# Patient Record
Sex: Female | Born: 1955
Health system: Southern US, Community
[De-identification: ages and names within clinical notes are randomized; demographics above are authoritative.]

## PROBLEM LIST (undated history)

## (undated) DIAGNOSIS — F17200 Nicotine dependence, unspecified, uncomplicated: Secondary | ICD-10-CM

## (undated) DIAGNOSIS — J45901 Unspecified asthma with (acute) exacerbation: Secondary | ICD-10-CM

## (undated) DIAGNOSIS — R160 Hepatomegaly, not elsewhere classified: Secondary | ICD-10-CM

## (undated) DIAGNOSIS — Z6841 Body Mass Index (BMI) 40.0 and over, adult: Secondary | ICD-10-CM

## (undated) DIAGNOSIS — J984 Other disorders of lung: Secondary | ICD-10-CM

## (undated) DIAGNOSIS — J441 Chronic obstructive pulmonary disease with (acute) exacerbation: Secondary | ICD-10-CM

## (undated) DIAGNOSIS — G4733 Obstructive sleep apnea (adult) (pediatric): Secondary | ICD-10-CM

## (undated) DIAGNOSIS — R0602 Shortness of breath: Secondary | ICD-10-CM

## (undated) DIAGNOSIS — I517 Cardiomegaly: Secondary | ICD-10-CM

## (undated) DIAGNOSIS — I2721 Secondary pulmonary arterial hypertension: Secondary | ICD-10-CM

## (undated) DIAGNOSIS — R0683 Snoring: Secondary | ICD-10-CM

## (undated) DIAGNOSIS — E669 Obesity, unspecified: Secondary | ICD-10-CM

## (undated) DIAGNOSIS — I2789 Other specified pulmonary heart diseases: Secondary | ICD-10-CM

## (undated) DIAGNOSIS — R609 Edema, unspecified: Secondary | ICD-10-CM

## (undated) DIAGNOSIS — E785 Hyperlipidemia, unspecified: Secondary | ICD-10-CM

## (undated) HISTORY — DX: Secondary pulmonary arterial hypertension: I27.21

## (undated) HISTORY — DX: Unspecified asthma with (acute) exacerbation: J45.901

## (undated) HISTORY — PX: DILATION AND CURETTAGE OF UTERUS: SHX78

## (undated) HISTORY — DX: Cardiomegaly: I51.7

## (undated) HISTORY — DX: Edema, unspecified: R60.9

## (undated) HISTORY — DX: Other disorders of lung: J98.4

## (undated) HISTORY — DX: Hyperlipidemia, unspecified: E78.5

## (undated) HISTORY — PX: TUBAL LIGATION: SHX77

## (undated) HISTORY — DX: Obesity, unspecified: E66.9

## (undated) HISTORY — DX: Nicotine dependence, unspecified, uncomplicated: F17.200

## (undated) HISTORY — DX: Other specified pulmonary heart diseases: I27.89

## (undated) HISTORY — DX: Body Mass Index (BMI) 40.0 and over, adult: Z684

## (undated) HISTORY — DX: Chronic obstructive pulmonary disease with (acute) exacerbation: J44.1

## (undated) HISTORY — DX: Hepatomegaly, not elsewhere classified: R16.0

## (undated) HISTORY — DX: Obstructive sleep apnea (adult) (pediatric): G47.33

---

## 2011-04-01 DIAGNOSIS — I509 Heart failure, unspecified: Secondary | ICD-10-CM

## 2011-04-01 DIAGNOSIS — I279 Pulmonary heart disease, unspecified: Secondary | ICD-10-CM

## 2011-04-02 ENCOUNTER — Inpatient Hospital Stay (HOSPITAL_COMMUNITY)
Admission: AD | Admit: 2011-04-02 | Discharge: 2011-04-09 | DRG: 286 | Disposition: A | Discharge status: Home / Self Care | Payer: Medicaid - Out of State | Source: Other Acute Inpatient Hospital | Attending: Internal Medicine | Admitting: Internal Medicine

## 2011-04-02 DIAGNOSIS — F17200 Nicotine dependence, unspecified, uncomplicated: Secondary | ICD-10-CM | POA: Diagnosis present

## 2011-04-02 DIAGNOSIS — Z7982 Long term (current) use of aspirin: Secondary | ICD-10-CM

## 2011-04-02 DIAGNOSIS — J9610 Chronic respiratory failure, unspecified whether with hypoxia or hypercapnia: Secondary | ICD-10-CM

## 2011-04-02 DIAGNOSIS — G4733 Obstructive sleep apnea (adult) (pediatric): Secondary | ICD-10-CM | POA: Diagnosis present

## 2011-04-02 DIAGNOSIS — I2789 Other specified pulmonary heart diseases: Secondary | ICD-10-CM | POA: Diagnosis present

## 2011-04-02 DIAGNOSIS — Z79899 Other long term (current) drug therapy: Secondary | ICD-10-CM

## 2011-04-02 DIAGNOSIS — J9620 Acute and chronic respiratory failure, unspecified whether with hypoxia or hypercapnia: Secondary | ICD-10-CM | POA: Diagnosis present

## 2011-04-02 DIAGNOSIS — I5031 Acute diastolic (congestive) heart failure: Secondary | ICD-10-CM | POA: Diagnosis present

## 2011-04-02 DIAGNOSIS — I2609 Other pulmonary embolism with acute cor pulmonale: Principal | ICD-10-CM | POA: Diagnosis present

## 2011-04-02 DIAGNOSIS — J449 Chronic obstructive pulmonary disease, unspecified: Secondary | ICD-10-CM

## 2011-04-02 DIAGNOSIS — G4710 Hypersomnia, unspecified: Secondary | ICD-10-CM

## 2011-04-02 DIAGNOSIS — E662 Morbid (severe) obesity with alveolar hypoventilation: Secondary | ICD-10-CM | POA: Diagnosis present

## 2011-04-02 DIAGNOSIS — R0602 Shortness of breath: Secondary | ICD-10-CM

## 2011-04-02 DIAGNOSIS — G4730 Sleep apnea, unspecified: Secondary | ICD-10-CM

## 2011-04-02 DIAGNOSIS — R0902 Hypoxemia: Secondary | ICD-10-CM | POA: Diagnosis present

## 2011-04-02 DIAGNOSIS — J441 Chronic obstructive pulmonary disease with (acute) exacerbation: Secondary | ICD-10-CM | POA: Diagnosis present

## 2011-04-02 DIAGNOSIS — IMO0002 Reserved for concepts with insufficient information to code with codable children: Secondary | ICD-10-CM

## 2011-04-02 DIAGNOSIS — Z7901 Long term (current) use of anticoagulants: Secondary | ICD-10-CM

## 2011-04-02 HISTORY — PX: CARDIAC CATHETERIZATION: SHX172

## 2011-04-02 HISTORY — DX: Shortness of breath: R06.02

## 2011-04-02 LAB — POCT I-STAT 3, VENOUS BLOOD GAS (G3P V)
Bicarbonate: 30.4 mEq/L — ABNORMAL HIGH (ref 20.0–24.0)
Bicarbonate: 25.8 mEq/L — ABNORMAL HIGH (ref 20.0–24.0)
Bicarbonate: 31.4 mEq/L — ABNORMAL HIGH (ref 20.0–24.0)
TCO2: 33 mmol/L (ref 0–100)
pCO2, Ven: 50.7 mmHg — ABNORMAL HIGH (ref 45.0–50.0)
pCO2, Ven: 51 mmHg — ABNORMAL HIGH (ref 45.0–50.0)
pCO2, Ven: 51.1 mmHg — ABNORMAL HIGH (ref 45.0–50.0)
pCO2, Ven: 53.5 mmHg — ABNORMAL HIGH (ref 45.0–50.0)
pCO2, Ven: 53.6 mmHg — ABNORMAL HIGH (ref 45.0–50.0)
pCO2, Ven: 53.9 mmHg — ABNORMAL HIGH (ref 45.0–50.0)
pH, Ven: 7.383 — ABNORMAL HIGH (ref 7.250–7.300)
pH, Ven: 7.392 — ABNORMAL HIGH (ref 7.250–7.300)
pH, Ven: 7.311 — ABNORMAL HIGH (ref 7.250–7.300)
pH, Ven: 7.373 — ABNORMAL HIGH (ref 7.250–7.300)
pH, Ven: 7.38 — ABNORMAL HIGH (ref 7.250–7.300)
pH, Ven: 7.382 — ABNORMAL HIGH (ref 7.250–7.300)
pO2, Ven: 39 mmHg (ref 30.0–45.0)
pO2, Ven: 41 mmHg (ref 30.0–45.0)
pO2, Ven: 42 mmHg (ref 30.0–45.0)

## 2011-04-02 LAB — PROTIME-INR
INR: 1.3 (ref 0.00–1.49)
Prothrombin Time: 16.4 seconds — ABNORMAL HIGH (ref 11.6–15.2)

## 2011-04-02 LAB — HEMOGLOBIN A1C
Hgb A1c MFr Bld: 6.4 % — ABNORMAL HIGH (ref <5.7)
Mean Plasma Glucose: 137 mg/dL — ABNORMAL HIGH (ref <117)

## 2011-04-02 LAB — POCT I-STAT 3, ART BLOOD GAS (G3+)
Acid-Base Excess: 3 mmol/L — ABNORMAL HIGH (ref 0.0–2.0)
pH, Arterial: 7.407 — ABNORMAL HIGH (ref 7.350–7.400)
pO2, Arterial: 80 mmHg (ref 80.0–100.0)

## 2011-04-03 ENCOUNTER — Encounter (HOSPITAL_COMMUNITY): Payer: Self-pay | Admitting: Radiology

## 2011-04-03 ENCOUNTER — Inpatient Hospital Stay (HOSPITAL_COMMUNITY): Payer: Medicaid - Out of State

## 2011-04-03 DIAGNOSIS — R0602 Shortness of breath: Secondary | ICD-10-CM | POA: Insufficient documentation

## 2011-04-03 DIAGNOSIS — M79609 Pain in unspecified limb: Secondary | ICD-10-CM

## 2011-04-03 LAB — BASIC METABOLIC PANEL
CO2: 34 mEq/L — ABNORMAL HIGH (ref 19–32)
Calcium: 9.6 mg/dL (ref 8.4–10.5)
GFR calc Af Amer: >60 mL/min (ref >60)
GFR calc non Af Amer: >60 mL/min (ref >60)
Sodium: 138 mEq/L (ref 135–145)

## 2011-04-03 LAB — CK TOTAL AND CKMB (NOT AT ARMC): Total CK: 34 U/L (ref 7–177)

## 2011-04-03 LAB — CBC
MCH: 31.3 pg (ref 26.0–34.0)
MCHC: 32.8 g/dL (ref 30.0–36.0)
Platelets: 216 10*3/uL (ref 150–400)
RBC: 5.37 MIL/uL — ABNORMAL HIGH (ref 3.87–5.11)

## 2011-04-03 LAB — HIV ANTIBODY (ROUTINE TESTING W REFLEX): HIV: NONREACTIVE

## 2011-04-03 LAB — LACTIC ACID, PLASMA: Lactic Acid, Venous: 2.1 mmol/L (ref 0.5–2.2)

## 2011-04-03 MED ORDER — TECHNETIUM TO 99M ALBUMIN AGGREGATED
6.0000 | Freq: Once | INTRAVENOUS | Status: AC | PRN
  Administered 2011-04-03: 6 via INTRAVENOUS

## 2011-04-03 MED ORDER — XENON XE 133 GAS
10.0000 | GAS_FOR_INHALATION | Freq: Once | RESPIRATORY_TRACT | Status: AC | PRN
  Administered 2011-04-03: 10 via RESPIRATORY_TRACT

## 2011-04-04 DIAGNOSIS — I517 Cardiomegaly: Secondary | ICD-10-CM

## 2011-04-04 LAB — PROTIME-INR
INR: 1.19 (ref 0.00–1.49)
Prothrombin Time: 15.4 seconds — ABNORMAL HIGH (ref 11.6–15.2)

## 2011-04-04 LAB — CBC
HCT: 53.1 % — ABNORMAL HIGH (ref 36.0–46.0)
Hemoglobin: 16.6 g/dL — ABNORMAL HIGH (ref 12.0–15.0)
RBC: 5.55 MIL/uL — ABNORMAL HIGH (ref 3.87–5.11)
WBC: 9.9 10*3/uL (ref 4.0–10.5)

## 2011-04-04 LAB — BASIC METABOLIC PANEL
BUN: 26 mg/dL — ABNORMAL HIGH (ref 6–23)
Calcium: 9.6 mg/dL (ref 8.4–10.5)
Creatinine, Ser: 0.65 mg/dL (ref 0.50–1.10)
GFR calc Af Amer: >60 mL/min (ref >60)
GFR calc non Af Amer: >60 mL/min (ref >60)
Potassium: 3.4 mEq/L — ABNORMAL LOW (ref 3.5–5.1)

## 2011-04-05 ENCOUNTER — Inpatient Hospital Stay (HOSPITAL_COMMUNITY): Payer: Medicaid - Out of State

## 2011-04-05 DIAGNOSIS — G4710 Hypersomnia, unspecified: Secondary | ICD-10-CM

## 2011-04-05 DIAGNOSIS — I2789 Other specified pulmonary heart diseases: Secondary | ICD-10-CM

## 2011-04-05 DIAGNOSIS — G4730 Sleep apnea, unspecified: Secondary | ICD-10-CM

## 2011-04-05 DIAGNOSIS — J449 Chronic obstructive pulmonary disease, unspecified: Secondary | ICD-10-CM

## 2011-04-05 DIAGNOSIS — J9610 Chronic respiratory failure, unspecified whether with hypoxia or hypercapnia: Secondary | ICD-10-CM

## 2011-04-05 LAB — CBC
MCH: 30.6 pg (ref 26.0–34.0)
MCHC: 32.1 g/dL (ref 30.0–36.0)
MCV: 95.4 fL (ref 78.0–100.0)
Platelets: 148 10*3/uL — ABNORMAL LOW (ref 150–400)
RDW: 15.7 % — ABNORMAL HIGH (ref 11.5–15.5)

## 2011-04-05 LAB — BASIC METABOLIC PANEL
BUN: 23 mg/dL (ref 6–23)
CO2: 36 mEq/L — ABNORMAL HIGH (ref 19–32)
Calcium: 9.2 mg/dL (ref 8.4–10.5)
GFR calc non Af Amer: >60 mL/min (ref >60)
Glucose, Bld: 91 mg/dL (ref 70–99)

## 2011-04-05 LAB — CYCLIC CITRUL PEPTIDE ANTIBODY, IGG: Cyclic Citrullin Peptide Ab: <2 U/mL (ref 0.0–5.0)

## 2011-04-06 LAB — BASIC METABOLIC PANEL
CO2: 32 mEq/L (ref 19–32)
GFR calc non Af Amer: >60 mL/min (ref >60)
Glucose, Bld: 97 mg/dL (ref 70–99)
Potassium: 4.4 mEq/L (ref 3.5–5.1)
Sodium: 137 mEq/L (ref 135–145)

## 2011-04-06 LAB — CBC
HCT: 53.2 % — ABNORMAL HIGH (ref 36.0–46.0)
MCV: 95.9 fL (ref 78.0–100.0)
Platelets: 158 10*3/uL (ref 150–400)
RBC: 5.55 MIL/uL — ABNORMAL HIGH (ref 3.87–5.11)
WBC: 10 10*3/uL (ref 4.0–10.5)

## 2011-04-06 LAB — PROTIME-INR: INR: 1.73 — ABNORMAL HIGH (ref 0.00–1.49)

## 2011-04-07 DIAGNOSIS — J9610 Chronic respiratory failure, unspecified whether with hypoxia or hypercapnia: Secondary | ICD-10-CM

## 2011-04-07 DIAGNOSIS — I2789 Other specified pulmonary heart diseases: Secondary | ICD-10-CM

## 2011-04-07 DIAGNOSIS — J449 Chronic obstructive pulmonary disease, unspecified: Secondary | ICD-10-CM

## 2011-04-07 DIAGNOSIS — G4710 Hypersomnia, unspecified: Secondary | ICD-10-CM

## 2011-04-07 DIAGNOSIS — G4730 Sleep apnea, unspecified: Secondary | ICD-10-CM

## 2011-04-07 LAB — PROTIME-INR
INR: 2 — ABNORMAL HIGH (ref 0.00–1.49)
Prothrombin Time: 23 seconds — ABNORMAL HIGH (ref 11.6–15.2)

## 2011-04-07 LAB — POCT I-STAT 3, ART BLOOD GAS (G3+)
Acid-Base Excess: 7 mmol/L — ABNORMAL HIGH (ref 0.0–2.0)
Bicarbonate: 33 mEq/L — ABNORMAL HIGH (ref 20.0–24.0)
Patient temperature: 98.6
pH, Arterial: 7.445 — ABNORMAL HIGH (ref 7.350–7.400)

## 2011-04-08 LAB — BASIC METABOLIC PANEL
BUN: 19 mg/dL (ref 6–23)
Calcium: 10 mg/dL (ref 8.4–10.5)
Creatinine, Ser: 0.6 mg/dL (ref 0.50–1.10)
GFR calc Af Amer: >60 mL/min (ref >60)
GFR calc non Af Amer: >60 mL/min (ref >60)
Glucose, Bld: 83 mg/dL (ref 70–99)
Potassium: 4.5 mEq/L (ref 3.5–5.1)

## 2011-04-08 LAB — PROTIME-INR: Prothrombin Time: 23 seconds — ABNORMAL HIGH (ref 11.6–15.2)

## 2011-04-09 DIAGNOSIS — I2789 Other specified pulmonary heart diseases: Secondary | ICD-10-CM

## 2011-04-12 ENCOUNTER — Telehealth (HOSPITAL_COMMUNITY): Payer: Self-pay | Admitting: Pharmacist

## 2011-04-12 MED ORDER — POTASSIUM CHLORIDE ER 10 MEQ PO TBCR: 20.0000 meq | EXTENDED_RELEASE_TABLET | Freq: Two times a day (BID) | ORAL | Status: DC

## 2011-04-12 NOTE — Telephone Encounter (Signed)
Alexis [name] had questions regarding Muccinex and Protonix for home. Informed Alexis [name] to stop Muccinex as ordered and it is ok to take OTC Pepcid 35m  Daily. Also encouraged her to contact social services for Disability .[facility]  Potassium 20 meq bid.  Alexis PVoylesverbalized understanding .

## 2011-04-13 ENCOUNTER — Ambulatory Visit (INDEPENDENT_AMBULATORY_CARE_PROVIDER_SITE_OTHER): Payer: Self-pay | Admitting: *Deleted

## 2011-04-13 DIAGNOSIS — Z7901 Long term (current) use of anticoagulants: Secondary | ICD-10-CM

## 2011-04-13 DIAGNOSIS — I272 Pulmonary hypertension, unspecified: Secondary | ICD-10-CM | POA: Insufficient documentation

## 2011-04-20 ENCOUNTER — Ambulatory Visit (INDEPENDENT_AMBULATORY_CARE_PROVIDER_SITE_OTHER): Payer: Self-pay | Admitting: *Deleted

## 2011-04-20 DIAGNOSIS — Z7901 Long term (current) use of anticoagulants: Secondary | ICD-10-CM

## 2011-04-20 DIAGNOSIS — I272 Pulmonary hypertension, unspecified: Secondary | ICD-10-CM

## 2011-04-26 ENCOUNTER — Encounter: Payer: Self-pay | Admitting: Cardiology

## 2011-04-27 ENCOUNTER — Ambulatory Visit (INDEPENDENT_AMBULATORY_CARE_PROVIDER_SITE_OTHER): Payer: Self-pay | Admitting: *Deleted

## 2011-04-27 DIAGNOSIS — I2789 Other specified pulmonary heart diseases: Secondary | ICD-10-CM

## 2011-04-27 DIAGNOSIS — Z7901 Long term (current) use of anticoagulants: Secondary | ICD-10-CM

## 2011-04-27 DIAGNOSIS — I272 Pulmonary hypertension, unspecified: Secondary | ICD-10-CM

## 2011-04-27 LAB — POCT INR: INR: 2.1

## 2011-04-27 NOTE — Cardiovascular Report (Signed)
Alexis [name], Alexis [name]              ACCOUNT NO.:  [id number]  MEDICAL RECORD NO.:  [id number]  LOCATION:  2910                         FACILITY:  [facility]  PHYSICIAN:  Wallis Bamberg. Johnsie Cancel, MD, FACCDATE OF BIRTH:  10/14/1955  DATE OF PROCEDURE:  04/02/2011 DATE OF DISCHARGE:                           CARDIAC CATHETERIZATION   INDICATION:  A 55 year old patient with poorly diagnoses history of pulmonary hypertension, most of her initial care had been done in [place].  She was transferred by Dr. Dannielle Burn for further workup including right and left heart catheterization.  The patient was somewhat tenuous on arriving to the cath lab.  She is morbidly obese and has tenuous oxygen status.  Standard catheterization was done with a 7-French venous sheath and a 5- French arterial sheath.  Right heart catheterization showed severe pulmonary hypertension which was systemic.  Mean right atrial pressure was 25, RV pressure was 117/13, PA pressure was 112/54, mean pulmonary capillary wedge pressure was 23.  Cardiac output was 5.79 liters per minute with a cardiac index of 3.33 liters per minute per meter squared.  Calculated STR was 968.  The calculated pulmonary vascular resistance was 4 times normal at 1093.  Simultaneous RV and LV pressures confirm systemic pulmonary hypertension.  Saturations showed no obvious left-to-right shunt.  However, a bidirectional shunt which would be expected in the case of systemic pulmonary hypertension, would not show up, the SVC sat was 68%.  RA sat was 79%, IVC sat was 72%, RV sat was 75%, PA sat was 72%.  Aortic sat was 96%.  These were done with the patient on 2 liters of oxygen.  Coronary arteriography.  Arteriography showed no significant coronary artery disease.  Left main coronary artery was normal.  The left anterior descending artery was a large vessel that wrapped the apex, it was normal.  First diagonal branch was normal.  Second diagonal  branch was small and normal.  Circumflex coronary artery had a near separate ostia primarily reflected single large obtuse marginal branch which was normal.  The right coronary artery was dominant.  There was a large RV branch that was normal.  RAO ventriculography, ventriculography showed normal LV function with an EF of 55%.  There is no grain across the aortic valve and no MR.  Aortic pressure was 115/71.  LV pressure was 117.  IMPRESSION:  The patient has severe systemic pulmonary hypertension. She has a very poor prognosis.  There was no obvious unidirectional shunt by cath.  There was a question previous history of anomalous pulmonary veins, at this point it would not make a difference.  There is no surgical therapy for the patient.  Our pulmonary doctors were suppose to see her, options may include dual vasodilator therapy including Revatio and Letairis.  Alternatively, transferred to [facility] for Port-A- Cath and flow line therapy may be in order.  However, neither one these are likely to make a large clinical difference and palliative care should also be considered.  Due to the patient's severe systemic pulmonary hypertension, she will be transferred to a Critical Care Unit as even a little bit of blood loss after sheath pull could cause a hemodynamic spiral.     Alexis [name]  Tommy Rainwater, MD, [facility]     PCN/MEDQ  D:  04/02/2011  T:  04/03/2011  Job:  [id number]  cc:   Ernestine Mcmurray, MD,FACC  Electronically Signed by Jenkins Rouge MD [facility] on 04/27/2011 09:23:04 AM

## 2011-04-28 ENCOUNTER — Telehealth (HOSPITAL_COMMUNITY): Payer: Self-pay | Admitting: Adult Health

## 2011-04-28 ENCOUNTER — Ambulatory Visit (INDEPENDENT_AMBULATORY_CARE_PROVIDER_SITE_OTHER): Payer: Self-pay | Admitting: Cardiology

## 2011-04-28 ENCOUNTER — Encounter: Payer: Self-pay | Admitting: Cardiology

## 2011-04-28 VITALS — BP 94/65 | HR 86 | Ht 62.0 in | Wt 207.0 lb

## 2011-04-28 DIAGNOSIS — Z7901 Long term (current) use of anticoagulants: Secondary | ICD-10-CM

## 2011-04-28 DIAGNOSIS — I2721 Secondary pulmonary arterial hypertension: Secondary | ICD-10-CM | POA: Insufficient documentation

## 2011-04-28 DIAGNOSIS — J439 Emphysema, unspecified: Secondary | ICD-10-CM | POA: Insufficient documentation

## 2011-04-28 DIAGNOSIS — G4733 Obstructive sleep apnea (adult) (pediatric): Secondary | ICD-10-CM

## 2011-04-28 DIAGNOSIS — J45901 Unspecified asthma with (acute) exacerbation: Secondary | ICD-10-CM

## 2011-04-28 DIAGNOSIS — I2789 Other specified pulmonary heart diseases: Secondary | ICD-10-CM

## 2011-04-28 DIAGNOSIS — I517 Cardiomegaly: Secondary | ICD-10-CM | POA: Insufficient documentation

## 2011-04-28 DIAGNOSIS — I279 Pulmonary heart disease, unspecified: Secondary | ICD-10-CM

## 2011-04-28 DIAGNOSIS — R0602 Shortness of breath: Secondary | ICD-10-CM

## 2011-04-28 DIAGNOSIS — J329 Chronic sinusitis, unspecified: Secondary | ICD-10-CM

## 2011-04-28 MED ORDER — HYDROCODONE-ACETAMINOPHEN 10-500 MG PO TABS: 1.0000 | ORAL_TABLET | Freq: Three times a day (TID) | ORAL | Status: AC | PRN

## 2011-04-28 MED ORDER — AZITHROMYCIN 1 G PO PACK: 1.0000 | PACK | Freq: Once | ORAL | Status: DC

## 2011-04-28 MED ORDER — HYDROCODONE-ACETAMINOPHEN 10-500 MG PO TABS: 1.0000 | ORAL_TABLET | Freq: Three times a day (TID) | ORAL | Status: DC | PRN

## 2011-04-28 MED ORDER — AZITHROMYCIN 250 MG PO TABS: ORAL_TABLET | ORAL | Status: DC

## 2011-04-28 MED ORDER — FLUTICASONE PROPIONATE 50 MCG/ACT NA SUSP: NASAL | Status: DC

## 2011-04-28 NOTE — Progress Notes (Signed)
HPI The patient is a 55 year old female who presented recently to [facility] with acute on chronic respiratory failure. Based on the CT scan it was felt that the patient have severe pulmonary hypertension and was referred for diagnostic cardiac catheterization. Her PA pressures and mean pulmonary artery pressure were markedly elevated requiring initiation of oxygen therapy, anticoagulation and oral tadalafil. She had no significant coronary artery disease. She was found to have severe obstructive/restrictive lung disease but with very little response to bronchodilator therapy. She was then subsequently referred to Dr. Koleen Nimrod for followup. From a cardiac standpoint the patient has been stable. He does have some lower extremity edema but this has improved on Lasix. Her left ventricular function is normal. She was also diagnosed with obstructive sleep apnea and placed on BiPAP. She did complain of a headache over the last several days and was wondering if this could be related to the oral tadalafil, which is certainly possible side effect of the medication. However the patient also complains of sinus tenderness both frontal and maxillary and fullness in both years. She reports no fevers.  Allergies  Allergen Reactions  . Bactrim     Face and eye swelling  . Penicillins   . Sulfa Antibiotics     Current Outpatient Prescriptions on File Prior to Visit  Medication Sig Dispense Refill  . albuterol (PROVENTIL HFA;VENTOLIN HFA) 108 (90 BASE) MCG/ACT inhaler Inhale 2 puffs into the lungs every 2 (two) hours as needed.        Marland Kitchen albuterol-ipratropium (COMBIVENT) 18-103 MCG/ACT inhaler Inhale 2 puffs into the lungs every 6 (six) hours as needed.        Marland Kitchen aspirin 81 MG EC tablet Take 81 mg by mouth daily.        . furosemide (LASIX) 40 MG tablet Take 40 mg by mouth daily.       . potassium chloride SA (K-DUR,KLOR-CON) 20 MEQ tablet Take 20 mEq by mouth 2 (two) times daily.       Marland Kitchen warfarin (COUMADIN)  5 MG tablet Take 5 mg by mouth daily. As directed         Past Medical History  Diagnosis Date  . SOB (shortness of breath) on exertion   . Other chronic pulmonary heart diseases   . Chronic obstructive asthma with exacerbation     CT scan was a pattern but no evidence of pulmonary embolus and., Negative VQ scan July 2012 pulmonary function test FVC 1.17 L per minute 37% of predicted, FEV1 1 L 40% of predicted. DLCO 61% of predicted no response to bronchodilators negative HIV serology, ANA in scleroderma.  . Tobacco use disorder   . Obesity, unspecified   . Other and unspecified hyperlipidemia   . Cardiomegaly     Normal LV systolic function ejection fraction 55-60% with moderate decrease in RV function.  . Other diseases of lung, not elsewhere classified   . Hepatomegaly   . Body mass index 40.0-44.9, adult   . Edema   . Pulmonary arterial hypertension      PA pressure 112/54 mmHg the mean of 74 mmHg, right atrial pressure mean 25 mmHg. Pulmonary vascular resistance 13 with units. Cardiac output 5.8 L. Normal coronary arteries with normal LV function July 2012   . Obstructive sleep apnea     BiPAP initiated July 2012    Past Surgical History  Procedure Date  . Cesarean section   . Tubal ligation   . Dilation and curettage of uterus   .  Cardiac catheterization 04/02/2011    Family History  Problem Relation Age of Onset  . Hypertension Mother   . Heart failure Brother     History   Social History  . Marital Status: Married    Spouse Name: N/A    Number of Children: N/A  . Years of Education: N/A   Occupational History  . RETIRED    Social History Main Topics  . Smoking status: Former Smoker -- 1.0 packs/day for 40 years    Types: Cigarettes    Quit date: 03/30/2011  . Smokeless tobacco: Never Used  . Alcohol Use: No  . Drug Use: No  . Sexually Active: Not on file   Other Topics Concern  . Not on file   Social History Narrative  . No narrative on file    WUX:[id number] positives as outlined above. The remainder of the 18  point review of systems is negative  PHYSICAL EXAM BP 94/65  Pulse 86  Ht _0  (1.575 m)  Wt 207 lb (93.895 kg)  BMI 37.86 kg/m2  SpO2 94%  General: Mildly dyspneic, using oxygen therapy Head: Normocephalic and atraumatic Eyes:PERRLA/EOMI intact, conjunctiva and lids normal Ears: No deformity or lesions, right eardrum appears dull with normal light reflex. Mild external otitis in the left ear. Frontal sinus tenderness on palpation as well as both maxillary sinuses. Mouth:normal dentition, normal posterior pharynx Neck: Supple, no JVD.  No masses, thyromegaly or abnormal cervical nodes Lungs: Normal breath sounds bilaterally without wheezing.  Normal percussion Cardiac: regular rate and rhythm with normal S1 and very prominent S2, right-sided S3 or S4.  PMI is normal.  No pathological murmurs Abdomen: Normal bowel sounds, abdomen is soft and nontender without masses, organomegaly or hernias noted.  No hepatosplenomegaly MSK: Back normal, normal gait muscle strength and tone normal Vascular: Pulse is normal in all 4 extremities Extremities: Trace peripheral pitting edema Neurologic: Alert and oriented x 3 Skin: Intact without lesions or rashes Lymphatics: No significant adenopathy Psychologic: Normal affect   ECG: Not available  ASSESSMENT AND PLAN

## 2011-04-28 NOTE — Assessment & Plan Note (Signed)
Continue inhaler therapy

## 2011-04-28 NOTE — Patient Instructions (Signed)
   Z- pack - take as directed Your physician recommends that you go to the [facility] for lab work on Friday 8/24 for CBC, BMET, & PT/INR If the results of your test are normal or stable, you will receive a letter.  If they are abnormal, the nurse will contact you by phone. If you have not heard from [facility] by end of day, please call office in regards to Coumadin dosing Lortab 10/500 - one tab every 8 hours as needed for headache - # 30 tabs only. Flonase Nasal Spray - 1-2 sprays each nostril twice a day   Continue all other cardiac medications Your physician wants you to follow up in: 6 months.  You will receive a reminder letter in the mail one-two months in advance.  If you don't receive a letter, please call our office to schedule the follow up appointment

## 2011-04-28 NOTE — Assessment & Plan Note (Signed)
Much improved since his been placed on medical therapy and oxygen therapy. The patient will followup with Dr. Koleen Nimrod and in the pulmonary hypertension clinic in [place].

## 2011-04-28 NOTE — Assessment & Plan Note (Signed)
Patient appears to be euvolemic. Will not change her Lasix therapy for now

## 2011-04-28 NOTE — Telephone Encounter (Signed)
Dr Koleen Nimrod requested she change to liquid oxygen. Dr Lanier Clam evaluated her today and he also requested liquid oxygen. She is requesting prescription for liquid oxygen. She is receiving home oxygen from [facility].  Discussed with Pearletha Forge at [facility] and she will  require a visit from respiratory therapist for an evaluation of breathing. Order faxed to [facility] for liquid oxygen evaluation.

## 2011-04-28 NOTE — Telephone Encounter (Signed)
Order for respitory therapy to eval for liquid oxygen faxed to [facility] at [phone]

## 2011-04-28 NOTE — Assessment & Plan Note (Signed)
The patient may need additional therapy for her pulmonary hypertension but this can be decided in the pulmonary hypertension clinic as well as followup right heart catheterization in the future.

## 2011-04-28 NOTE — Assessment & Plan Note (Signed)
Patient is compliant with BiPAP.

## 2011-04-28 NOTE — Assessment & Plan Note (Signed)
Patient appears to have had acute frontal and maxillary sinusitis. She will be given a nasal spray and Flonase,  azithromycin as an antibiotic [she is penicillin allergic], will have her followup on Friday to reassess her PT INR with initiation of antibiotic therapy. Adjustments are likely needed. I also gave her prescription for Lortab due to sinus pain.

## 2011-04-30 ENCOUNTER — Ambulatory Visit (INDEPENDENT_AMBULATORY_CARE_PROVIDER_SITE_OTHER): Payer: Self-pay | Admitting: *Deleted

## 2011-04-30 DIAGNOSIS — R0989 Other specified symptoms and signs involving the circulatory and respiratory systems: Secondary | ICD-10-CM

## 2011-04-30 LAB — PROTIME-INR: INR: 1.7 — AB (ref 0.9–1.1)

## 2011-05-04 NOTE — Discharge Summary (Signed)
[name], [name]              ACCOUNT NO.:  [id number]  MEDICAL RECORD NO.:  [id number]  LOCATION:  2919                         FACILITY:  [facility]  PHYSICIAN:  Shaune Pascal. Bensimhon, MDDATE OF BIRTH:  19-May-1956  DATE OF ADMISSION:  04/02/2011 DATE OF DISCHARGE:  04/09/2011                              DISCHARGE SUMMARY   DISCHARGE DIAGNOSES: 1. Acute-on-chronic respiratory failure. 2. Severe obstructive/restrictive lung disease. 3. Severe pulmonary hypertension. 4. Chronic obstructive pulmonary disease. 5. Morbid obesity. 6. Obstructive sleep apnea. 7. Acute diastolic heart failure.  HOSPITAL COURSE:  Ms. [name] is a very pleasant 55 year old woman with a history of morbid obesity and COPD.  She has had a long history of tobacco use and continues to smoke.  She was admitted to [facility] with COPD exacerbation and acute diastolic heart failure.  She was seen by Dr. Dannielle Burn, an echocardiogram was obtained which showed normal LV function with an EF of 55-60% with moderate decrease in her RV function.  She had a CT of the chest which showed mosaic pattern, but no evidence of acute pulmonary embolus, her pulmonary arteries were markedly enlarged concerning for pulmonary hypertension.  There was also notation of anomalous pulmonary venous return with one of her pulmonary veins.  Based on her CT and the concern for pulmonary hypertension, she was were referred to [facility] for cardiac catheterization.  She underwent cardiac catheterization by Dr. Jenkins Rouge on April 02, 2011.  This showed normal coronary arteries with a normal LV function. Her right heart cath showed severe pulmonary hypertension.  The right atrial pressure had a mean of 25, RV pressure is 117/13, PA pressure was 112/54 with mean of 74.  Cardiac output was 5.8 liters.  Cardiac index of 3.3 liters.  Her pulmonary vascular resistance calculated to approximately 13 Woods units.  After catheterization,  she was started on Revatio and the pulmonologist were consulted.  She was started on antibiotics, nebulizers, and steroids for her COPD flare.  She underwent pulmonary function tests which showed an FVC of 1.17 liters which was 37% predicted.  The FEV-1 was 1.0 liters which was 40% predicted.  The ratio was 108%.  The DLCO was diminished at 61% of predicted.  It was felt that she had a combination of severe obstructive and restrictive lung disease.  There was no response to bronchodilators.  Despite the severity of her PFTs, it was felt that her pulmonary retention was out of proportion to her lung disease.  Serologies were sent; HIV serology, ANA and scleroderma serologies were negative.  She was started on oxygen as well as Coumadin for her severe pulmonary retention and hypoxemia.  A V/Q scan was performed which did not show any evidence of pulmonary embolus.  We also reviewed her previous sleep study, however, it was felt that in order to qualify for BiPAP or CPAP she would need a repeat study, we performed overnight oximetry in the hospital which showed significant desaturations and thus she qualified for BiPAP that way.  Prior to discharge, she underwent extensive discharge planning, we switched Revatio over to Adcirca 40 mg a day for use of compliance.  We arranged for her to be seen in  the Pulmonary Rehab in the [place] office. We also arranged to have her have home oxygen as well as home BiPAP.  I have discussed her case with Dr. Dannielle Burn and also Dr. Lizbeth Bark in Pulmonary who will follow her up.  She will see Dr. Dannielle Burn as an outpatient and she will also follow up in the Pulmonary Hypertension Clinic here and in [place].  MEDICINES ON DISCHARGE: 1. Flovent 1 puff inhaled twice daily. 2. Lasix 40 mg daily. 3. Aspirin 81 mg daily. 4. Combivent 2 puffs inhaled q.6 h. 5. Potassium chloride 20 mEq twice daily. 6. Prednisone 20 mg through August 6, 10 mg through August  10, 5 mg     through August 15. 7. Coumadin 5 mg 1-1/2 tablets on Monday and Friday with 1 tablet     daily. 8. Albuterol inhaler 2 puffs q.2 h. p.r.n. shortness of breath.  FOLLOWUP PLANS AND INSTRUCTIONS: 1. The patient will follow up with Dr. Koleen Nimrod with Pulmonary in     [place] on August 10 at 3 p.m. 2. The patient will have Coumadin Clinic follow up on August 7 at 10     a.m. at [facility] 3. The patient will follow up Dr. Dannielle Burn on August 22 at 9 a.m. 4. The patient will follow up in the [facility] on     September 4, at 10:15 a.m. 5. The patient is to call the office for any problems or any concerns     in the interim. 6. The patient is to increase activity as tolerated. 7. Pulmonary Rehab will contact her for further instructions at     [facility]. 8. Duration of discharge is greater than 45 minutes of physician and     extended time.     Shaune Pascal. Bensimhon, MD     DRB/MEDQ  D:  04/09/2011  T:  04/09/2011  Job:  [id number]  cc:   Ramond Dial, M.D. Ernestine Mcmurray, MD,FACC Shaune Pascal. Bensimhon, MD  Electronically Signed by Glori Bickers MD on 05/04/2011 05:20:57 PM

## 2011-05-04 NOTE — Discharge Summary (Signed)
  [name], Alexis [name]              ACCOUNT NO.:  [id number]  MEDICAL RECORD NO.:  [id number]  LOCATION:  2919                         FACILITY:  [facility]  PHYSICIAN:  Shaune Pascal. Bensimhon, MDDATE OF BIRTH:  10-Nov-1955  DATE OF ADMISSION:  04/02/2011 DATE OF DISCHARGE:                              DISCHARGE SUMMARY   ADDENDUM:  This is an addendum to a previously dictated discharge summary.  Please see that dictation for a full hospital course.  Please update discharge medications with Adcirca 40 mg daily.     Cecille Amsterdam, PA-C   ______________________________ Shaune Pascal. Bensimhon, MD    NB/MEDQ  D:  04/09/2011  T:  04/09/2011  Job:  [id number]  Electronically Signed by Pennie Rushing P.A. on 04/09/2011 02:49:41 PM Electronically Signed by Glori Bickers MD on 05/04/2011 05:21:01 PM

## 2011-05-11 ENCOUNTER — Encounter (HOSPITAL_COMMUNITY): Payer: Self-pay

## 2011-05-11 ENCOUNTER — Ambulatory Visit (HOSPITAL_COMMUNITY)
Admission: RE | Admit: 2011-05-11 | Discharge: 2011-05-11 | Disposition: A | Discharge status: Home / Self Care | Payer: Medicaid - Out of State | Source: Ambulatory Visit | Attending: Internal Medicine | Admitting: Internal Medicine

## 2011-05-11 DIAGNOSIS — I2789 Other specified pulmonary heart diseases: Secondary | ICD-10-CM | POA: Insufficient documentation

## 2011-05-11 DIAGNOSIS — I2721 Secondary pulmonary arterial hypertension: Secondary | ICD-10-CM

## 2011-05-11 NOTE — Progress Notes (Signed)
HPI: Alexis [name] is a very pleasant 55 year old woman with  a history of morbid obesity and COPD.  She has had a long history of  tobacco use and continues to smoke.  She was admitted to [facility] with COPD exacerbation and acute diastolic heart failure. Ehchocardiogram was obtained which showed  normal LV  April 02, 2011.  This showed normal coronary arteries with a normal LV function.  Her right heart cath showed severe pulmonary hypertension.  The right  atrial pressure had a mean of 25, RV pressure is 117/13, PA pressure was  112/54 with mean of 74.  Cardiac output was 5.8 liters.  Cardiac index  of 3.3 liters.  Her pulmonary vascular resistance calculated to  approximately 13 Woods units. unction with an EF of 55-60% with moderate decrease in her RV  Function.   Follow up post hospitalization  D/C weight 204 . Has followed with Dr. Koleen Nimrod who is managing her lung disease. No CP. Breathing much better.  Feels better. Wearing oxygen only as she needs it. Going pulmonary rehab 3 times a week. Working on disability.  Weight at home 204-205.Alexis [name] provided by financial assist program. Has quit smoking!   ROS: All other systems normal except as mentioned in HPI, past medical history and problem list.    Past Medical History  Diagnosis Date  . SOB (shortness of breath) on exertion   . Other chronic pulmonary heart diseases   . Chronic obstructive asthma with exacerbation     CT scan was a pattern but no evidence of pulmonary embolus and., Negative VQ scan July 2012 pulmonary function test FVC 1.17 L per minute 37% of predicted, FEV1 1 L 40% of predicted. DLCO 61% of predicted no response to bronchodilators negative HIV serology, ANA in scleroderma.  . Tobacco use disorder   . Obesity, unspecified   . Other and unspecified hyperlipidemia   . Cardiomegaly     Normal LV systolic function ejection fraction 55-60% with moderate decrease in RV function.  . Other diseases of lung, not  elsewhere classified   . Hepatomegaly   . Body mass index 40.0-44.9, adult   . Edema   . Pulmonary arterial hypertension      PA pressure 112/54 mmHg the mean of 74 mmHg, right atrial pressure mean 25 mmHg. Pulmonary vascular resistance 13 with units. Cardiac output 5.8 L. Normal coronary arteries with normal LV function July 2012   . Obstructive sleep apnea     BiPAP initiated July 2012    Current Outpatient Prescriptions  Medication Sig Dispense Refill  . albuterol (PROVENTIL HFA;VENTOLIN HFA) 108 (90 BASE) MCG/ACT inhaler Inhale 2 puffs into the lungs every 2 (two) hours as needed.        Marland Kitchen albuterol-ipratropium (COMBIVENT) 18-103 MCG/ACT inhaler Inhale 2 puffs into the lungs every 6 (six) hours as needed.        Marland Kitchen aspirin 81 MG EC tablet Take 81 mg by mouth daily.        . fluticasone (FLOVENT HFA) 220 MCG/ACT inhaler Inhale 1 puff into the lungs 2 (two) times daily.        . furosemide (LASIX) 40 MG tablet Take 40 mg by mouth daily.       Marland Kitchen omeprazole (PRILOSEC) 20 MG capsule Take 20 mg by mouth daily.        . potassium chloride SA (K-DUR,KLOR-CON) 20 MEQ tablet Take 20 mEq by mouth 2 (two) times daily.       Marland Kitchen  Tadalafil, PAH, (ADCIRCA) 20 MG TABS Take 40 tablets by mouth daily.        Marland Kitchen warfarin (COUMADIN) 5 MG tablet Take 5 mg by mouth daily. As directed          Allergies  Allergen Reactions  . Bactrim     Face and eye swelling  . Penicillins   . Sulfa Antibiotics     History   Social History  . Marital Status: Married    Spouse Name: N/A    Number of Children: N/A  . Years of Education: N/A   Occupational History  . RETIRED    Social History Main Topics  . Smoking status: Former Smoker -- 1.0 packs/day for 40 years    Types: Cigarettes    Quit date: 03/30/2011  . Smokeless tobacco: Never Used  . Alcohol Use: No  . Drug Use: No  . Sexually Active: Not on file   Other Topics Concern  . Not on file   Social History Narrative  . No narrative on file     Family History  Problem Relation Age of Onset  . Hypertension Mother   . Heart failure Brother     PHYSICAL EXAM: Filed Vitals:   05/11/11 1059  BP: 113/87  Pulse: 105   General:  Well appearing. No respiratory difficulty HEENT: normal Neck: supple. no JVP 6 . Carotids 2+ bilat; no bruits. No lymphadenopathy or thryomegaly appreciated. Cor: PMI nondisplaced. Regular rate & rhythm. No rubs, gallops or murmurs. Lungs: clear RLL diminished Abdomen: soft, nontender, nondistended. No hepatosplenomegaly. No bruits or masses. Good bowel sounds. Extremities: no cyanosis, clubbing, rash, RLL LLE 1+  edema Neuro: alert & oriented x 3, cranial nerves grossly intact. moves all 4 extremities w/o difficulty. Affect pleasant.  ECG:  ASSESSMENT & PLAN:

## 2011-05-11 NOTE — Assessment & Plan Note (Addendum)
Much improved. Volume status stable. Tolerating medications. Continue pulmonary rehab 3 times a week, oxygen, and adcirca 41m daily. Stressed need to wear O2 with exertion to keep sats > 90%. Will follow up in one month and recheck echo.    Patient seen and examined with ADarrick Grinder NP. We discussed all aspects of the encounter. I agree with the assessment and plan as stated above.

## 2011-05-11 NOTE — Patient Instructions (Signed)
Your physician has requested that you have an echocardiogram. Echocardiography is a painless test that uses sound waves to create images of your heart. It provides your doctor with information about the size and shape of your heart and how well your heart's chambers and valves are working. This procedure takes approximately one hour. There are no restrictions for this procedure.  In 1 month  Your physician recommends that you schedule a follow-up appointment in: 1 month.

## 2011-05-14 ENCOUNTER — Ambulatory Visit (INDEPENDENT_AMBULATORY_CARE_PROVIDER_SITE_OTHER): Payer: Self-pay | Admitting: *Deleted

## 2011-05-14 DIAGNOSIS — I272 Pulmonary hypertension, unspecified: Secondary | ICD-10-CM

## 2011-05-14 DIAGNOSIS — Z7901 Long term (current) use of anticoagulants: Secondary | ICD-10-CM

## 2011-05-14 LAB — POCT INR: INR: 1.7

## 2011-06-01 ENCOUNTER — Ambulatory Visit (INDEPENDENT_AMBULATORY_CARE_PROVIDER_SITE_OTHER): Payer: Self-pay | Admitting: *Deleted

## 2011-06-01 DIAGNOSIS — Z7901 Long term (current) use of anticoagulants: Secondary | ICD-10-CM

## 2011-06-01 MED ORDER — WARFARIN SODIUM 5 MG PO TABS: ORAL_TABLET | ORAL | Status: DC

## 2011-06-09 ENCOUNTER — Encounter (HOSPITAL_COMMUNITY): Payer: Self-pay

## 2011-06-09 ENCOUNTER — Other Ambulatory Visit (HOSPITAL_COMMUNITY): Payer: Self-pay

## 2011-06-17 ENCOUNTER — Ambulatory Visit (HOSPITAL_COMMUNITY)
Admission: RE | Admit: 2011-06-17 | Discharge: 2011-06-17 | Disposition: A | Discharge status: Home / Self Care | Payer: Medicaid - Out of State | Source: Ambulatory Visit | Attending: Internal Medicine | Admitting: Internal Medicine

## 2011-06-17 VITALS — BP 108/72 | HR 110 | Wt 212.0 lb

## 2011-06-17 DIAGNOSIS — I509 Heart failure, unspecified: Secondary | ICD-10-CM

## 2011-06-17 DIAGNOSIS — Z87891 Personal history of nicotine dependence: Secondary | ICD-10-CM | POA: Insufficient documentation

## 2011-06-17 DIAGNOSIS — J449 Chronic obstructive pulmonary disease, unspecified: Secondary | ICD-10-CM | POA: Insufficient documentation

## 2011-06-17 DIAGNOSIS — I2721 Secondary pulmonary arterial hypertension: Secondary | ICD-10-CM

## 2011-06-17 DIAGNOSIS — I5032 Chronic diastolic (congestive) heart failure: Secondary | ICD-10-CM | POA: Insufficient documentation

## 2011-06-17 DIAGNOSIS — I2789 Other specified pulmonary heart diseases: Secondary | ICD-10-CM | POA: Insufficient documentation

## 2011-06-17 DIAGNOSIS — J4489 Other specified chronic obstructive pulmonary disease: Secondary | ICD-10-CM | POA: Insufficient documentation

## 2011-06-17 NOTE — Assessment & Plan Note (Signed)
Weight up as above. Increasing lasix.

## 2011-06-17 NOTE — Progress Notes (Signed)
HPI The patient is a 55 year old female who presented recently to [facility] with acute on chronic respiratory failure. Based on the CT scan it was felt that the patient have severe pulmonary hypertension and was referred for diagnostic cardiac catheterization. Her PA pressures and mean pulmonary artery pressure were markedly elevated requiring initiation of oxygen therapy, anticoagulation and oral tadalafil. She had no significant coronary artery disease. She was found to have severe obstructive/restrictive lung disease but with very little response to bronchodilator therapy. She was then subsequently referred to Dr. Koleen Nimrod for followup.   April 02, 2011. The right atrial pressure had a mean of 25, RV pressure is 117/13, PA pressure was 112/54 with mean of 74. Cardiac output was 5.8 liters. Cardiac index of 3.3 liters. Her pulmonary vascular resistance calculated to approximately 13 Woods units. unction with an EF of 55-60% with moderate decrease in her RV Function.   Saw Dr. Dannielle Burn on 8/22. Dyspnea improved - felt she was euvolemic. Returns today for f/u on her PAH and diastolic dysfunction. Says she is feeling about the same. Weight is up about 8 pounds after missing several doses of lasix and attending [place] party in [place] over the weekend. Still SOB with just ambulating around house. Wearing O2. +edema. Gets episode of CP about 1x/week. Going to pulmonary rehab and doing well.   Had echo today EF 60-65%. RV moderately dilated and mild to moderately hypokinetic with septal flattening   Allergies  Allergen Reactions  . Bactrim     Face and eye swelling  . Penicillins   . Sulfa Antibiotics     Current Outpatient Prescriptions on File Prior to Encounter  Medication Sig Dispense Refill  . albuterol (PROVENTIL HFA;VENTOLIN HFA) 108 (90 BASE) MCG/ACT inhaler Inhale 2 puffs into the lungs every 2 (two) hours as needed.        Marland Kitchen albuterol-ipratropium (COMBIVENT) 18-103 MCG/ACT inhaler  Inhale 2 puffs into the lungs every 6 (six) hours as needed.        Marland Kitchen aspirin 81 MG EC tablet Take 81 mg by mouth daily.        . fluticasone (FLOVENT HFA) 220 MCG/ACT inhaler Inhale 1 puff into the lungs 2 (two) times daily.        . furosemide (LASIX) 40 MG tablet Take 40 mg by mouth daily.       Marland Kitchen omeprazole (PRILOSEC) 20 MG capsule Take 20 mg by mouth daily.        . potassium chloride SA (K-DUR,KLOR-CON) 20 MEQ tablet Take 20 mEq by mouth 2 (two) times daily.       . Tadalafil, PAH, (ADCIRCA) 20 MG TABS Take 40 tablets by mouth daily.        Marland Kitchen warfarin (COUMADIN) 5 MG tablet Take 1 1/2 tablets daily except 1 tablet on Sundays and Thursdays or as directed  60 tablet  3    Past Medical History  Diagnosis Date  . SOB (shortness of breath) on exertion   . Other chronic pulmonary heart diseases   . Chronic obstructive asthma with exacerbation     CT scan was a pattern but no evidence of pulmonary embolus and., Negative VQ scan July 2012 pulmonary function test FVC 1.17 L per minute 37% of predicted, FEV1 1 L 40% of predicted. DLCO 61% of predicted no response to bronchodilators negative HIV serology, ANA in scleroderma.  . Tobacco use disorder   . Obesity, unspecified   . Other and unspecified hyperlipidemia   .  Cardiomegaly     Normal LV systolic function ejection fraction 55-60% with moderate decrease in RV function.  . Other diseases of lung, not elsewhere classified   . Hepatomegaly   . Body mass index 40.0-44.9, adult   . Edema   . Pulmonary arterial hypertension      PA pressure 112/54 mmHg the mean of 74 mmHg, right atrial pressure mean 25 mmHg. Pulmonary vascular resistance 13 with units. Cardiac output 5.8 L. Normal coronary arteries with normal LV function July 2012   . Obstructive sleep apnea     BiPAP initiated July 2012    Past Surgical History  Procedure Date  . Cesarean section   . Tubal ligation   . Dilation and curettage of uterus   . Cardiac catheterization  04/02/2011    Family History  Problem Relation Age of Onset  . Hypertension Mother   . Heart failure Brother     History   Social History  . Marital Status: Married    Spouse Name: N/A    Number of Children: N/A  . Years of Education: N/A   Occupational History  . RETIRED    Social History Main Topics  . Smoking status: Former Smoker -- 1.0 packs/day for 40 years    Types: Cigarettes    Quit date: 03/30/2011  . Smokeless tobacco: Never Used  . Alcohol Use: No  . Drug Use: No  . Sexually Active: Not on file   Other Topics Concern  . Not on file   Social History Narrative  . No narrative on file   GXQ:JJHERDEYC positives as outlined above. The remainder of the 18  point review of systems is negative  PHYSICAL EXAM BP 108/72  Pulse 110  Wt 212 lb (96.163 kg)  SpO2 96%  General: Well appearing. No respiratory difficulty  HEENT: normal  Neck: supple. Thick. Hard to assess JVP. Carotids 2+ bilat; no bruits. No lymphadenopathy or thryomegaly appreciated.  Cor: PMI nonpalpable. Distant. Regular rate and rhythm/ rubs, gallops or murmurs.  Lungs: clear   Abdomen: distended. nontender No hepatosplenomegaly. No bruits or masses. Good bowel sounds.  Extremities: no cyanosis, clubbing, rash, 1-2+ edema Neuro: alert & oriented x 3, cranial nerves grossly intact. moves all 4 extremities w/o difficulty. Affect pleasant.   ASSESSMENT AND PLAN

## 2011-06-17 NOTE — Patient Instructions (Signed)
Take Furosemide 80 mg for 3 days and call on Monday with update on how you are doing [phone]  Your physician recommends that you schedule a follow-up appointment in: 1 month

## 2011-06-17 NOTE — Assessment & Plan Note (Signed)
Doing fairly well but has some volume overload. PA pressures much improved on echo but still elevated with signs of RV strain. Given severe lung disease would not add on 2nd agent at this point (we had considered Ventavis). Continue pulm rehab and O2. Will double lasix for 3 days and have her call back with results on Monday. If weight not down at least 6 or 7 pounds we will need to see her again soon. Long discussion about use of sliding scale lasix.

## 2011-06-21 ENCOUNTER — Telehealth (HOSPITAL_COMMUNITY): Payer: Self-pay | Admitting: *Deleted

## 2011-06-21 NOTE — Telephone Encounter (Addendum)
Pt called this am to let you know that she has lost between 4-6 pounds, depending on which scale she gets on.  She wants to know if she should continue with her meds or if she should change anything.  Went to rehab today, weight is 212, which is the same as last Tuesday.

## 2011-06-22 ENCOUNTER — Ambulatory Visit (INDEPENDENT_AMBULATORY_CARE_PROVIDER_SITE_OTHER): Payer: Self-pay | Admitting: *Deleted

## 2011-06-22 DIAGNOSIS — Z7901 Long term (current) use of anticoagulants: Secondary | ICD-10-CM

## 2011-06-22 DIAGNOSIS — I272 Pulmonary hypertension, unspecified: Secondary | ICD-10-CM

## 2011-06-22 NOTE — Telephone Encounter (Signed)
Discussed all with Leone Haven, PA will have pt to continue to double her lasix for 3 more days, she will go to [facility] tomorrow for a bmet, order was faxed to them

## 2011-06-22 NOTE — Telephone Encounter (Signed)
Left message to call back

## 2011-06-22 NOTE — Telephone Encounter (Signed)
Spoke w/Alexis [name] she states she double her lasix and Fri her wt was 218 at home so she doubled lasix for 4 days instead of 3, today her wt is 212 which is what it was when we saw her last week, she states she can tell her abd is tight and swollen, will discuss with Dr Haroldine Laws this afternoon and call her back

## 2011-06-23 ENCOUNTER — Encounter: Payer: Self-pay | Admitting: Internal Medicine

## 2011-06-25 ENCOUNTER — Telehealth (HOSPITAL_COMMUNITY): Payer: Self-pay | Admitting: *Deleted

## 2011-06-25 NOTE — Telephone Encounter (Signed)
Per Alexis [name] pt will continue with usual dose of lasix and will take extra if wt gain of 3 lbs in 1 day or 5 lb in 5 days, she is aware and verbalizes understanding, also explained importance of going by morning wt's on the same scale

## 2011-06-25 NOTE — Telephone Encounter (Signed)
Alexis [name] called this am, returning your call.

## 2011-06-25 NOTE — Telephone Encounter (Signed)
Spoke w/pt she states she has been taking increased Lasix but her wt was up to 213 yest and 212 today, so she really is not down at all.  Her labs from 10/17 are good, bun 17 cr 0.78, will discuss with Leone Haven, PA and call her back

## 2011-06-30 ENCOUNTER — Encounter: Payer: Self-pay | Admitting: *Deleted

## 2011-06-30 ENCOUNTER — Telehealth (HOSPITAL_COMMUNITY): Payer: Self-pay | Admitting: *Deleted

## 2011-06-30 NOTE — Telephone Encounter (Signed)
Pt states she is still having trouble with wt, it is not coming down today it was 215 lbs, she states her ankles are swollen, she will come to clinic for eval tom 10/25 at 12:30

## 2011-06-30 NOTE — Telephone Encounter (Signed)
Pt called this am, returning your call.  She will be home all day.

## 2011-07-01 ENCOUNTER — Ambulatory Visit (HOSPITAL_COMMUNITY)
Admission: RE | Admit: 2011-07-01 | Discharge: 2011-07-01 | Disposition: A | Discharge status: Home / Self Care | Payer: Medicaid - Out of State | Source: Ambulatory Visit | Attending: Internal Medicine | Admitting: Internal Medicine

## 2011-07-01 DIAGNOSIS — I2789 Other specified pulmonary heart diseases: Secondary | ICD-10-CM | POA: Insufficient documentation

## 2011-07-01 DIAGNOSIS — I2721 Secondary pulmonary arterial hypertension: Secondary | ICD-10-CM

## 2011-07-01 DIAGNOSIS — I5032 Chronic diastolic (congestive) heart failure: Secondary | ICD-10-CM | POA: Insufficient documentation

## 2011-07-01 DIAGNOSIS — I509 Heart failure, unspecified: Secondary | ICD-10-CM

## 2011-07-01 MED ORDER — FUROSEMIDE 40 MG PO TABS: ORAL_TABLET | ORAL | Status: DC

## 2011-07-01 NOTE — Progress Notes (Signed)
HPI The patient is a 55 year old female who presented recently to [facility] with acute on chronic respiratory failure. Based on the CT scan it was felt that the patient have severe pulmonary hypertension and was referred for diagnostic cardiac catheterization. Her PA pressures and mean pulmonary artery pressure were markedly elevated requiring initiation of oxygen therapy, anticoagulation and oral tadalafil. She had no significant coronary artery disease. She was found to have severe obstructive/restrictive lung disease but with very little response to bronchodilator therapy. She was then subsequently referred to Dr. Koleen Nimrod for followup.   April 02, 2011. The right atrial pressure had a mean of 25, RV pressure is 117/13, PA pressure was 112/54 with mean of 74. Cardiac output was 5.8 liters. Cardiac index of 3.3 liters. Her pulmonary vascular resistance calculated to approximately 13 Woods units. unction with an EF of 55-60% with moderate decrease in her RV Function.   Saw Dr. Dannielle Burn on 8/22. Dyspnea improved - felt she was euvolemic. Returns today for f/u on her PAH and diastolic dysfunction. Says she is feeling about the same. Weight is up about 8 pounds after missing several doses of lasix and attending [place] party in [place] over the weekend. Still SOB with just ambulating around house. Wearing O2. +edema. Gets episode of CP about 1x/week. Going to pulmonary rehab and doing well.   Had echo today EF 60-65%. RV moderately dilated and mild to moderately hypokinetic with septal flattening  10-17 creatinine 0.78 She is here for follow up due to increase weight gain. Weight at home 216-215. She had 80 mg Lasix for one week with only one pound weight loss. Weight is going back up. Complains of abdominal bloating. Ankle edema. Wear oxygen majority of the day. She is coughing more. SOB on incline or she is going to fast. No dizziness. Continues at pulmonary rehab weekly. She is able to perform  more exercise at pulmonary rehab. Low salt diet.      Allergies  Allergen Reactions  . Bactrim     Face and eye swelling  . Penicillins   . Sulfa Antibiotics     Current Outpatient Prescriptions on File Prior to Encounter  Medication Sig Dispense Refill  . albuterol (PROVENTIL HFA;VENTOLIN HFA) 108 (90 BASE) MCG/ACT inhaler Inhale 2 puffs into the lungs every 2 (two) hours as needed.        Marland Kitchen albuterol-ipratropium (COMBIVENT) 18-103 MCG/ACT inhaler Inhale 2 puffs into the lungs every 6 (six) hours as needed.        Marland Kitchen aspirin 81 MG EC tablet Take 81 mg by mouth daily.        . fluticasone (FLOVENT HFA) 220 MCG/ACT inhaler Inhale 1 puff into the lungs 2 (two) times daily.        . furosemide (LASIX) 40 MG tablet Take 40 mg by mouth daily.       Marland Kitchen omeprazole (PRILOSEC) 20 MG capsule Take 20 mg by mouth daily.        . potassium chloride SA (K-DUR,KLOR-CON) 20 MEQ tablet Take 20 mEq by mouth 2 (two) times daily.       . Tadalafil, PAH, (ADCIRCA) 20 MG TABS Take 40 tablets by mouth daily.        Marland Kitchen warfarin (COUMADIN) 5 MG tablet Take 1 1/2 tablets daily except 1 tablet on Sundays and Thursdays or as directed  60 tablet  3    Past Medical History  Diagnosis Date  . SOB (shortness of breath) on exertion   .  Other chronic pulmonary heart diseases   . Chronic obstructive asthma with exacerbation     CT scan was a pattern but no evidence of pulmonary embolus and., Negative VQ scan July 2012 pulmonary function test FVC 1.17 L per minute 37% of predicted, FEV1 1 L 40% of predicted. DLCO 61% of predicted no response to bronchodilators negative HIV serology, ANA in scleroderma.  . Tobacco use disorder   . Obesity, unspecified   . Other and unspecified hyperlipidemia   . Cardiomegaly     Normal LV systolic function ejection fraction 55-60% with moderate decrease in RV function.  . Other diseases of lung, not elsewhere classified   . Hepatomegaly   . Body mass index 40.0-44.9, adult   . Edema    . Pulmonary arterial hypertension      PA pressure 112/54 mmHg the mean of 74 mmHg, right atrial pressure mean 25 mmHg. Pulmonary vascular resistance 13 with units. Cardiac output 5.8 L. Normal coronary arteries with normal LV function July 2012   . Obstructive sleep apnea     BiPAP initiated July 2012    Past Surgical History  Procedure Date  . Cesarean section   . Tubal ligation   . Dilation and curettage of uterus   . Cardiac catheterization 04/02/2011    Family History  Problem Relation Age of Onset  . Hypertension Mother   . Heart failure Brother     History   Social History  . Marital Status: Married    Spouse Name: N/A    Number of Children: N/A  . Years of Education: N/A   Occupational History  . RETIRED    Social History Main Topics  . Smoking status: Former Smoker -- 1.0 packs/day for 40 years    Types: Cigarettes    Quit date: 03/30/2011  . Smokeless tobacco: Never Used  . Alcohol Use: No  . Drug Use: No  . Sexually Active: Not on file   Other Topics Concern  . Not on file   Social History Narrative  . No narrative on file   OER:[id number] positives as outlined above. The remainder of the 18  point review of systems is negative  PHYSICAL EXAM BP 86/68  Pulse 90  Wt 213 lb (96.616 kg)  SpO2 95%  General: Well appearing. No respiratory difficulty  HEENT: normal  Neck: supple. Thick. Hard to assess JVP. Carotids 2+ bilat; no bruits. No lymphadenopathy or thryomegaly appreciated.  Cor: PMI nonpalpable. Distant. Regular rate and rhythm/ rubs, gallops or murmurs. Lungs: clear  2l continuous [place] Abdomen: distended. nontender No hepatosplenomegaly. No bruits or masses. Good bowel sounds.  Extremities: no cyanosis, clubbing, rash, RLE and LLE trace edema Neuro: alert & oriented x 3, cranial nerves grossly intact. moves all 4 extremities w/o difficulty. Affect pleasant.   ASSESSMENT AND PLAN

## 2011-07-01 NOTE — Patient Instructions (Signed)
Please increase Lasix to 40 mg in the morning and 20 mg in the evening.   Please check BMET next week at [facility] and fax results to HF clinic [phone]  Please follow up in one month  Please ask pulmonary rehab to repeat 6 minute walk and fax results to HF clinic [phone]  Continue to weigh and record daily

## 2011-07-01 NOTE — Assessment & Plan Note (Addendum)
NYHA III. Volume status elevated. Weight remains increased. Will increase Lasix to 40 mg bid. Discussed fluid restriction and will need to limit fluid to 2 liters per day. Will follow up in one week for BMET at [facility]. Follow up at HF clinic in one month.Marland Kitchen

## 2011-07-01 NOTE — Assessment & Plan Note (Addendum)
Volume status elevated.  Tolerating medications. Continue pulmonary rehab 3 times a week, oxygen, and adcirca 77m daily. Stressed need to wear O2 with exertion to keep sats > 90%. Will follow up in one month. Pulmonary Rehab to complete new 6 mw and fax results.  Will need repeat echo in near future.   Patient seen and examined with ADarrick Grinder NP. We discussed all aspects of the encounter. I agree with the assessment and plan as stated above.

## 2011-07-05 NOTE — Progress Notes (Signed)
Patient seen and examined with Amy Clegg, NP. We discussed all aspects of the encounter. I agree with the assessment and plan as stated above.   

## 2011-07-06 ENCOUNTER — Ambulatory Visit (INDEPENDENT_AMBULATORY_CARE_PROVIDER_SITE_OTHER): Payer: Self-pay | Admitting: *Deleted

## 2011-07-06 DIAGNOSIS — I272 Pulmonary hypertension, unspecified: Secondary | ICD-10-CM

## 2011-07-06 DIAGNOSIS — Z7901 Long term (current) use of anticoagulants: Secondary | ICD-10-CM

## 2011-07-06 LAB — POCT INR: INR: 1.6

## 2011-07-12 ENCOUNTER — Telehealth (HOSPITAL_COMMUNITY): Payer: Self-pay | Admitting: *Deleted

## 2011-07-12 NOTE — Telephone Encounter (Signed)
pts wt up today she will take lasix 40 and 40 today and call back tomorrow with update

## 2011-07-12 NOTE — Telephone Encounter (Signed)
Ms Goosby called this afternoon, she is gaining fluid again up to 215 1/2. She would like to know if she should take and extra lasix tonight.  Please follow up.

## 2011-07-13 ENCOUNTER — Telehealth (HOSPITAL_COMMUNITY): Payer: Self-pay | Admitting: *Deleted

## 2011-07-13 NOTE — Telephone Encounter (Signed)
Ms Alexis [name] called today to update you from yesterday.  She said that she is doing the same, no weight gain or loss.

## 2011-07-13 NOTE — Telephone Encounter (Signed)
Pt wt is still 215.5 today, no change, continue lasix 40 bid for now per Darrick Grinder, NP, also discussed fluid and salt intake, she will call back Thur with update

## 2011-07-15 ENCOUNTER — Encounter (HOSPITAL_COMMUNITY): Payer: Self-pay

## 2011-07-16 ENCOUNTER — Telehealth (HOSPITAL_COMMUNITY): Payer: Self-pay | Admitting: *Deleted

## 2011-07-16 NOTE — Telephone Encounter (Signed)
Alexis [name] called today to let you know that she has had no change in her weight since the increase with the lasix.  She stated that she was suppose to call yesterday, but her husband had a doctors appointment and was unable to call in.

## 2011-07-19 ENCOUNTER — Telehealth (HOSPITAL_COMMUNITY): Payer: Self-pay | Admitting: *Deleted

## 2011-07-19 MED ORDER — METOLAZONE 2.5 MG PO TABS: 2.5000 mg | ORAL_TABLET | ORAL | Status: DC

## 2011-07-19 NOTE — Telephone Encounter (Signed)
See phone note 11/12

## 2011-07-19 NOTE — Telephone Encounter (Signed)
Discussed with Dr Haroldine Laws, have pt take metolazone 2.5 mg for 2 days and have labs done on Thur at [facility] pt is aware, rx sent to [facility]

## 2011-07-19 NOTE — Telephone Encounter (Signed)
Pt called she  Has cut back to 1 1/2 on the lasix for the month, and has gained 1 pound today.  She is also finishing up with her pt, and will end on Thursday, she says it is helping her and would you like to continue it.  A new order will be needed for pt to continue.  She is going to [facility] for her pt. Cardiac rehab [phone], ext [phone]. Fax [phone], Manuela Schwartz is the therapist.

## 2011-07-20 NOTE — Telephone Encounter (Signed)
Left mess with Alexis [name] at Cardiac Rehab regarding next phase of cardiac rehab

## 2011-07-21 NOTE — Telephone Encounter (Signed)
Spoke w/Susan with Pulm rehab in [place], she states pt would like to continue Pulm rehab maintance phase, order faxed to her at [phone]

## 2011-07-22 ENCOUNTER — Telehealth (HOSPITAL_COMMUNITY): Payer: Self-pay | Admitting: *Deleted

## 2011-07-22 NOTE — Telephone Encounter (Signed)
Alexis [name] called to day to tell you how she did with her medication change.  She started with a  Weight on 216 on Monday, Tuesday she was 214 3/4, today she was 211 1/2.  She said the joints in her fingers and toes were sore, but they are better now.  She wants to know what she should do next with the lasix.  Please call her back. Thanks!

## 2011-07-23 ENCOUNTER — Encounter: Payer: Self-pay | Admitting: Internal Medicine

## 2011-07-23 NOTE — Telephone Encounter (Signed)
Spoke w/pt wt is done another 1/2 lb today, she can really tell a difference in edema, she is back on regular dose of lasix and will continue to watch her wt will take extra lasix if wt going up

## 2011-07-27 ENCOUNTER — Ambulatory Visit (INDEPENDENT_AMBULATORY_CARE_PROVIDER_SITE_OTHER): Payer: Self-pay | Admitting: *Deleted

## 2011-07-27 DIAGNOSIS — Z7901 Long term (current) use of anticoagulants: Secondary | ICD-10-CM

## 2011-07-27 DIAGNOSIS — I2789 Other specified pulmonary heart diseases: Secondary | ICD-10-CM

## 2011-07-27 DIAGNOSIS — I272 Pulmonary hypertension, unspecified: Secondary | ICD-10-CM

## 2011-07-28 ENCOUNTER — Telehealth (HOSPITAL_COMMUNITY): Payer: Self-pay | Admitting: *Deleted

## 2011-07-28 MED ORDER — SPIRONOLACTONE 25 MG PO TABS: 12.5000 mg | ORAL_TABLET | Freq: Every day | ORAL | Status: DC

## 2011-07-28 NOTE — Telephone Encounter (Signed)
Message copied by Scarlette Calico on Wed Jul 28, 2011  8:39 AM ------      Message from: Glori Bickers R      Created: Mon Jul 26, 2011 11:46 PM       k low. Add spiro 12.5 daily. Recheck bmet 1 week.

## 2011-07-28 NOTE — Telephone Encounter (Signed)
Pt aware rx sent in, labs 11/29 at [facility]

## 2011-08-03 ENCOUNTER — Telehealth (HOSPITAL_COMMUNITY): Payer: Self-pay | Admitting: *Deleted

## 2011-08-03 NOTE — Telephone Encounter (Signed)
Ms Gardiner called this am, she is having lower back pain and isn't sure what to do.  She would like a call back.

## 2011-08-03 NOTE — Telephone Encounter (Signed)
Pt is having pain in the middle of her lower back, it has been pretty constant for a couple of days, it hurts worse when she gets up and feels a little better in a sitting position, she will f/u with her pcp

## 2011-08-05 ENCOUNTER — Ambulatory Visit (HOSPITAL_COMMUNITY)
Admission: RE | Admit: 2011-08-05 | Discharge: 2011-08-05 | Disposition: A | Discharge status: Home / Self Care | Payer: Medicaid - Out of State | Source: Ambulatory Visit | Attending: Internal Medicine | Admitting: Internal Medicine

## 2011-08-05 ENCOUNTER — Telehealth (HOSPITAL_COMMUNITY): Payer: Self-pay | Admitting: *Deleted

## 2011-08-05 VITALS — BP 112/64 | HR 100 | Wt 219.5 lb

## 2011-08-05 DIAGNOSIS — I2789 Other specified pulmonary heart diseases: Secondary | ICD-10-CM | POA: Insufficient documentation

## 2011-08-05 DIAGNOSIS — I5032 Chronic diastolic (congestive) heart failure: Secondary | ICD-10-CM

## 2011-08-05 DIAGNOSIS — I509 Heart failure, unspecified: Secondary | ICD-10-CM

## 2011-08-05 DIAGNOSIS — I2721 Secondary pulmonary arterial hypertension: Secondary | ICD-10-CM

## 2011-08-05 LAB — BASIC METABOLIC PANEL
BUN: 18 mg/dL (ref 6–23)
Calcium: 9.5 mg/dL (ref 8.4–10.5)
Creatinine, Ser: 0.64 mg/dL (ref 0.50–1.10)
GFR calc Af Amer: >90 mL/min (ref >90)
GFR calc non Af Amer: >90 mL/min (ref >90)
Glucose, Bld: 83 mg/dL (ref 70–99)
Potassium: 4.5 mEq/L (ref 3.5–5.1)

## 2011-08-05 NOTE — Assessment & Plan Note (Addendum)
Volume status remains elevated with NYHA III symptoms.  Will add metolazone 2.5 mg for 2 days and increase Lasix to 40 mg bid. Had a long discussion about dietary indiscretions and specific foods she needs to limit due to increased sodium content.  She voiced understanding.  Will continue with fluid restriction.  Check BMET today and in 2 weeks with increased diuretics.

## 2011-08-05 NOTE — Patient Instructions (Signed)
Take metolazone 2.5 mg for 2 days.  Increase lasix 40 mg twice a day.  Continue other medications.    Labs today and in 2 weeks.    Follow up 6 weeks with Dr. Haroldine Laws  Do the following things EVERYDAY: 1) Weigh yourself in the morning before breakfast. Write it down and keep it in a log. 2) Take your medicines as prescribed 3) Eat low salt foods-Limit salt (sodium) to 2022m per day.  4) Stay as active as you can everyday

## 2011-08-05 NOTE — Assessment & Plan Note (Addendum)
Volume status elevated, tolerating medications. Will continue with adcirca 40 mg daily.  Continue wearing O2 daily, per patient her 6 MW was much improved with O2.  Has 1 month of pulmonary rehab left, will continue with this.    Patient seen and examined with Leone Haven PA-C. We discussed all aspects of the encounter. I agree with the assessment and plan as stated above.

## 2011-08-05 NOTE — Telephone Encounter (Signed)
Alexis [name] called this afternoon regarding today's visit.  She noticed on her print out that it says to take potassium 2 pills a day, and since she started a new med, she was told to only take 1 a day.  She needs clarification on how many to take.  Thanks

## 2011-08-05 NOTE — Telephone Encounter (Signed)
Pt is aware she needs to be taking 1 potassium tab daily and will continue doing this

## 2011-08-05 NOTE — Progress Notes (Signed)
HPI The patient is a 55 year old female who presented recently to [facility] with acute on chronic respiratory failure. Based on the CT scan it was felt that the patient have severe pulmonary hypertension and was referred for diagnostic cardiac catheterization. Her PA pressures and mean pulmonary artery pressure were markedly elevated requiring initiation of oxygen therapy, anticoagulation and oral tadalafil. She had no significant coronary artery disease. She was found to have severe obstructive/restrictive lung disease but with very little response to bronchodilator therapy. She was then subsequently referred to Dr. Koleen Nimrod for followup.   April 02, 2011. The right atrial pressure had a mean of 25, RV pressure is 117/13, PA pressure was 112/54 with mean of 74. Cardiac output was 5.8 liters. Cardiac index of 3.3 liters. Her pulmonary vascular resistance calculated to approximately 13 Woods units. unction with an EF of 55-60% with moderate decrease in her RV Function.   Saw Dr. Dannielle Burn on 8/22. Dyspnea improved - felt she was euvolemic. Returns today for f/u on her PAH and diastolic dysfunction. Says she is feeling about the same. Weight is up about 8 pounds after missing several doses of lasix and attending [place] party in [place] over the weekend. Still SOB with just ambulating around house. Wearing O2. +edema. Gets episode of CP about 1x/week. Going to pulmonary rehab and doing well.   Had echo today EF 60-65%. RV moderately dilated and mild to moderately hypokinetic with septal flattening  Weight up last visit and lasix increased 40 mg BID and metolazone 2.5 mg x2days.  Arlyce Harman.  Now taking lasix 58m am and 20 mg pm.  Not sure daily sodium intake, eating bacon for breakfast at least 4x/wk and taking in 2 tbsp of salt daily.  Doesn't like wearing O2 around the house but does wear it with any activity.  Dyspnea with any activity that is chronic and has not changed.  +Abdominal fullness.     (07/12/11) Repeat 6MW at pulmonary rehab unchanged from August 2012. 900 m.   Allergies  Allergen Reactions  . Bactrim     Face and eye swelling  . Penicillins   . Sulfa Antibiotics     Current Outpatient Prescriptions on File Prior to Encounter  Medication Sig Dispense Refill  . albuterol-ipratropium (COMBIVENT) 18-103 MCG/ACT inhaler Inhale 2 puffs into the lungs every 6 (six) hours as needed.        .Marland Kitchenaspirin 81 MG EC tablet Take 81 mg by mouth daily.        . fluticasone (FLOVENT HFA) 220 MCG/ACT inhaler Inhale 1 puff into the lungs 2 (two) times daily.        . furosemide (LASIX) 40 MG tablet Please take one tab in am and 1/2 in pm  60 tablet  6  . omeprazole (PRILOSEC) 20 MG capsule Take 20 mg by mouth daily.        . potassium chloride SA (K-DUR,KLOR-CON) 20 MEQ tablet Take 20 mEq by mouth 2 (two) times daily.       .Marland Kitchenspironolactone (ALDACTONE) 25 MG tablet Take 0.5 tablets (12.5 mg total) by mouth daily.  30 tablet  3  . Tadalafil, PAH, (ADCIRCA) 20 MG TABS Take 40 tablets by mouth daily.        .Marland Kitchenwarfarin (COUMADIN) 5 MG tablet Take 1 1/2 tablets daily except 1 tablet on Sundays and Thursdays or as directed  60 tablet  3  . albuterol (PROVENTIL HFA;VENTOLIN HFA) 108 (90 BASE) MCG/ACT inhaler Inhale 2 puffs into  the lungs every 2 (two) hours as needed.        . metolazone (ZAROXOLYN) 2.5 MG tablet Take 1 tablet (2.5 mg total) by mouth as directed.  5 tablet  0    Past Medical History  Diagnosis Date  . SOB (shortness of breath) on exertion   . Other chronic pulmonary heart diseases   . Chronic obstructive asthma with exacerbation     CT scan was a pattern but no evidence of pulmonary embolus and., Negative VQ scan July 2012 pulmonary function test FVC 1.17 L per minute 37% of predicted, FEV1 1 L 40% of predicted. DLCO 61% of predicted no response to bronchodilators negative HIV serology, ANA in scleroderma.  . Tobacco use disorder   . Obesity, unspecified   . Other and  unspecified hyperlipidemia   . Cardiomegaly     Normal LV systolic function ejection fraction 55-60% with moderate decrease in RV function.  . Other diseases of lung, not elsewhere classified   . Hepatomegaly   . Body mass index 40.0-44.9, adult   . Edema   . Pulmonary arterial hypertension      PA pressure 112/54 mmHg the mean of 74 mmHg, right atrial pressure mean 25 mmHg. Pulmonary vascular resistance 13 with units. Cardiac output 5.8 L. Normal coronary arteries with normal LV function July 2012   . Obstructive sleep apnea     BiPAP initiated July 2012    Past Surgical History  Procedure Date  . Cesarean section   . Tubal ligation   . Dilation and curettage of uterus   . Cardiac catheterization 04/02/2011    Family History  Problem Relation Age of Onset  . Hypertension Mother   . Heart failure Brother     History   Social History  . Marital Status: Married    Spouse Name: N/A    Number of Children: N/A  . Years of Education: N/A   Occupational History  . RETIRED    Social History Main Topics  . Smoking status: Former Smoker -- 1.0 packs/day for 40 years    Types: Cigarettes    Quit date: 03/30/2011  . Smokeless tobacco: Never Used  . Alcohol Use: No  . Drug Use: No  . Sexually Active: Not on file   Other Topics Concern  . Not on file   Social History Narrative  . No narrative on file   YKD:[id number] positives as outlined above. The remainder of the 18  point review of systems is negative  PHYSICAL EXAM BP 112/64  Pulse 100  Wt 219 lb 8 oz (99.565 kg)  SpO2 95%  General: Well appearing. No respiratory difficulty, on home O2.  HEENT: normal  Neck: supple. Thick. Hard to assess JVP. Carotids 2+ bilat; no bruits. No lymphadenopathy or thryomegaly appreciated.  Cor: PMI nonpalpable. Distant. Regular rate and rhythm/ rubs, gallops or murmurs. Lungs: clear   Abdomen: + distended. nontender No hepatosplenomegaly. No bruits or masses. Good bowel sounds.    Extremities: no cyanosis, clubbing, rash, RLE and LLE 1+ edema Neuro: alert & oriented x 3, cranial nerves grossly intact. moves all 4 extremities w/o difficulty. Affect pleasant.   ASSESSMENT AND PLAN

## 2011-08-06 NOTE — Progress Notes (Signed)
Patient seen and examined with Leone Haven PA-C. We discussed all aspects of the encounter. I agree with the assessment and plan as stated above.

## 2011-08-23 ENCOUNTER — Telehealth (HOSPITAL_COMMUNITY): Payer: Self-pay | Admitting: *Deleted

## 2011-08-23 NOTE — Telephone Encounter (Signed)
Alexis [name] called this am, she is in need of some refills on her inhalers that were ordered by Dr Cecilie Lowers.  She would like them called into [facility] in [place], the number there is [phone].  Thank you.

## 2011-08-24 ENCOUNTER — Ambulatory Visit (INDEPENDENT_AMBULATORY_CARE_PROVIDER_SITE_OTHER): Payer: Medicaid - Out of State | Admitting: *Deleted

## 2011-08-24 DIAGNOSIS — I2789 Other specified pulmonary heart diseases: Secondary | ICD-10-CM

## 2011-08-24 DIAGNOSIS — Z7901 Long term (current) use of anticoagulants: Secondary | ICD-10-CM

## 2011-08-24 DIAGNOSIS — I272 Pulmonary hypertension, unspecified: Secondary | ICD-10-CM

## 2011-08-24 NOTE — Telephone Encounter (Signed)
Unable to reach pt, no answering machine, will continue to try and reach pt

## 2011-08-25 MED ORDER — IPRATROPIUM-ALBUTEROL 18-103 MCG/ACT IN AERO: 2.0000 | INHALATION_SPRAY | Freq: Four times a day (QID) | RESPIRATORY_TRACT | Status: DC | PRN

## 2011-08-25 MED ORDER — FLUTICASONE PROPIONATE HFA 220 MCG/ACT IN AERO: 1.0000 | INHALATION_SPRAY | Freq: Two times a day (BID) | RESPIRATORY_TRACT | Status: DC

## 2011-08-25 MED ORDER — ALBUTEROL SULFATE HFA 108 (90 BASE) MCG/ACT IN AERS: 2.0000 | INHALATION_SPRAY | RESPIRATORY_TRACT | Status: DC | PRN

## 2011-08-25 NOTE — Telephone Encounter (Signed)
Sent in rx for inhalers with 1 refill she is aware that future refills have to come from pcp or pulm MD

## 2011-09-16 ENCOUNTER — Ambulatory Visit (HOSPITAL_COMMUNITY)
Admission: RE | Admit: 2011-09-16 | Discharge: 2011-09-16 | Disposition: A | Discharge status: Home / Self Care | Payer: Medicaid - Out of State | Source: Ambulatory Visit | Attending: Internal Medicine | Admitting: Internal Medicine

## 2011-09-16 ENCOUNTER — Encounter (HOSPITAL_COMMUNITY): Payer: Self-pay | Admitting: *Deleted

## 2011-09-16 VITALS — BP 108/68 | HR 91 | Wt 220.2 lb

## 2011-09-16 DIAGNOSIS — I2789 Other specified pulmonary heart diseases: Secondary | ICD-10-CM | POA: Insufficient documentation

## 2011-09-16 DIAGNOSIS — I2721 Secondary pulmonary arterial hypertension: Secondary | ICD-10-CM

## 2011-09-16 NOTE — Assessment & Plan Note (Addendum)
Functionally much improved this visit.  Doing well no changes to medication at this time.  Will proceed with RHC to reassess pulm pressures for possible addition of ventavis.  Have encouraged her to continue her food diary as this has helped with sodium restriction.    Patient seen and examined with Leone Haven PA-C. We discussed all aspects of the encounter. I agree with the assessment and plan as stated above. She has lost weight and is much improved. However pulmonary pressures were very high on original cath. Will plan repeat RHC to assess response to therapy and need for additional agent such as Ventavis, We reviewed the cath procedure and she is willing to proceed.  Quillian Quince Markan Cazarez,MD 10:47 AM

## 2011-09-16 NOTE — Progress Notes (Signed)
HPI The patient is a 56 year old female with history of PAH and diastolic dysfunction.   Initial presentation was to [facility] with acute on chronic respiratory failure. Based on the CT scan it was felt that the patient have severe pulmonary hypertension and was referred for diagnostic cardiac catheterization. Her PA pressures and mean pulmonary artery pressure were markedly elevated requiring initiation of oxygen therapy, anticoagulation and oral tadalafil. She had no significant coronary artery disease. She was found to have severe obstructive/restrictive lung disease but with very little response to bronchodilator therapy. She was then subsequently referred to Dr. Koleen Nimrod for followup.   April 02, 2011. The right atrial pressure had a mean of 25, RV pressure is 117/13, PA pressure was 112/54 with mean of 74. Cardiac output was 5.8 liters. Cardiac index of 3.3 liters. Her pulmonary vascular resistance calculated to approximately 13 Woods units. unction with an EF of 55-60% with moderate decrease in her RV Function.   Had echo 10/12 EF 60-65%. RV moderately dilated and mild to moderately hypokinetic with septal flattening RVSP 77  (07/12/11) Repeat 6MW at pulmonary rehab unchanged from August 2012. 900 m. Discharged from pulm rehab.  But has a free 1 month of rehab still to use.   She returns for follow up today.  She feels much better than last visit.  She is getting around the house without difficulty.  She has taken metolazone once since 11/29.  No dizziness/no CP.  She is able to get around the grocery store walking behind a cart.  Got walker, only uses it when walking long distances.  Keeping a food diary, doing well with keeping sodium <2 g.      Allergies  Allergen Reactions  . Bactrim     Face and eye swelling  . Penicillins   . Sulfa Antibiotics     Current Outpatient Prescriptions on File Prior to Encounter  Medication Sig Dispense Refill  . albuterol (PROVENTIL  HFA;VENTOLIN HFA) 108 (90 BASE) MCG/ACT inhaler Inhale 2 puffs into the lungs every 2 (two) hours as needed.  1 Inhaler  1  . albuterol-ipratropium (COMBIVENT) 18-103 MCG/ACT inhaler Inhale 2 puffs into the lungs every 6 (six) hours as needed.  14.7 g  1  . aspirin 81 MG EC tablet Take 81 mg by mouth daily.        . fluticasone (FLOVENT HFA) 220 MCG/ACT inhaler Inhale 1 puff into the lungs 2 (two) times daily.  1 Inhaler  1  . furosemide (LASIX) 40 MG tablet Please take one tab in am and 1/2 in pm  60 tablet  6  . metolazone (ZAROXOLYN) 2.5 MG tablet Take 1 tablet (2.5 mg total) by mouth as directed.  5 tablet  0  . omeprazole (PRILOSEC) 20 MG capsule Take 20 mg by mouth daily.        . potassium chloride SA (K-DUR,KLOR-CON) 20 MEQ tablet Take 20 mEq by mouth 2 (two) times daily.       Marland Kitchen spironolactone (ALDACTONE) 25 MG tablet Take 0.5 tablets (12.5 mg total) by mouth daily.  30 tablet  3  . Tadalafil, PAH, (ADCIRCA) 20 MG TABS Take 40 tablets by mouth daily.        Marland Kitchen warfarin (COUMADIN) 5 MG tablet Take 1 1/2 tablets daily except 1 tablet on Sundays and Thursdays or as directed  60 tablet  3    Past Medical History  Diagnosis Date  . SOB (shortness of breath) on exertion   . Other  chronic pulmonary heart diseases   . Chronic obstructive asthma with exacerbation     CT scan was a pattern but no evidence of pulmonary embolus and., Negative VQ scan July 2012 pulmonary function test FVC 1.17 L per minute 37% of predicted, FEV1 1 L 40% of predicted. DLCO 61% of predicted no response to bronchodilators negative HIV serology, ANA in scleroderma.  . Tobacco use disorder   . Obesity, unspecified   . Other and unspecified hyperlipidemia   . Cardiomegaly     Normal LV systolic function ejection fraction 55-60% with moderate decrease in RV function.  . Other diseases of lung, not elsewhere classified   . Hepatomegaly   . Body mass index 40.0-44.9, adult   . Edema   . Pulmonary arterial hypertension       PA pressure 112/54 mmHg the mean of 74 mmHg, right atrial pressure mean 25 mmHg. Pulmonary vascular resistance 13 with units. Cardiac output 5.8 L. Normal coronary arteries with normal LV function July 2012   . Obstructive sleep apnea     BiPAP initiated July 2012    Past Surgical History  Procedure Date  . Cesarean section   . Tubal ligation   . Dilation and curettage of uterus   . Cardiac catheterization 04/02/2011    Family History  Problem Relation Age of Onset  . Hypertension Mother   . Heart failure Brother     History   Social History  . Marital Status: Married    Spouse Name: N/A    Number of Children: N/A  . Years of Education: N/A   Occupational History  . RETIRED    Social History Main Topics  . Smoking status: Former Smoker -- 1.0 packs/day for 40 years    Types: Cigarettes    Quit date: 03/30/2011  . Smokeless tobacco: Never Used  . Alcohol Use: No  . Drug Use: No  . Sexually Active: Not on file   Other Topics Concern  . Not on file   Social History Narrative  . No narrative on file   MBE:MLJQGBEEF positives as outlined above. The remainder of the 18  point review of systems is negative  PHYSICAL EXAM There were no vitals taken for this visit.  General: Well appearing. No respiratory difficulty, on home O2.  HEENT: normal  Neck: supple. Thick. Hard to assess JVP. Carotids 2+ bilat; no bruits. No lymphadenopathy or thryomegaly appreciated.  Cor: PMI nonpalpable. Distant. Regular rate and rhythm/ rubs, gallops or murmurs. Lungs: clear   Abdomen: + distended. nontender No hepatosplenomegaly. No bruits or masses. Good bowel sounds.  Extremities: no cyanosis, clubbing, rash, RLE and LLE 1+ edema Neuro: alert & oriented x 3, cranial nerves grossly intact. moves all 4 extremities w/o difficulty. Affect pleasant.   ASSESSMENT AND PLAN

## 2011-09-16 NOTE — Patient Instructions (Signed)
Cath on 09/24/11  Labs on Tuesday 1/15  Your physician recommends that you schedule a follow-up appointment in: 6 weeks

## 2011-09-21 ENCOUNTER — Encounter (INDEPENDENT_AMBULATORY_CARE_PROVIDER_SITE_OTHER): Payer: Medicaid - Out of State | Admitting: *Deleted

## 2011-09-21 DIAGNOSIS — Z7901 Long term (current) use of anticoagulants: Secondary | ICD-10-CM

## 2011-09-21 DIAGNOSIS — I2789 Other specified pulmonary heart diseases: Secondary | ICD-10-CM

## 2011-09-24 ENCOUNTER — Inpatient Hospital Stay (HOSPITAL_BASED_OUTPATIENT_CLINIC_OR_DEPARTMENT_OTHER)
Admission: RE | Admit: 2011-09-24 | Payer: Medicaid - Out of State | Source: Ambulatory Visit | Admitting: Internal Medicine

## 2011-09-24 ENCOUNTER — Encounter (HOSPITAL_BASED_OUTPATIENT_CLINIC_OR_DEPARTMENT_OTHER): Admission: RE | Payer: Self-pay | Source: Ambulatory Visit

## 2011-09-24 SURGERY — JV RIGHT HEART CATHETERIZATION
Anesthesia: Moderate Sedation

## 2011-09-27 ENCOUNTER — Encounter: Payer: Self-pay | Admitting: Physician Assistant

## 2011-09-27 ENCOUNTER — Ambulatory Visit (INDEPENDENT_AMBULATORY_CARE_PROVIDER_SITE_OTHER): Payer: Medicaid - Out of State | Admitting: Physician Assistant

## 2011-09-27 VITALS — BP 114/78 | HR 88 | Ht 62.5 in | Wt 219.0 lb

## 2011-09-27 DIAGNOSIS — I509 Heart failure, unspecified: Secondary | ICD-10-CM

## 2011-09-27 DIAGNOSIS — Z7901 Long term (current) use of anticoagulants: Secondary | ICD-10-CM

## 2011-09-27 NOTE — Assessment & Plan Note (Signed)
Followup with Dr. Linde Gillis, as scheduled.

## 2011-09-27 NOTE — Assessment & Plan Note (Signed)
Stable on current diuretic regimen, with symptoms suggestive of class II heart failure. Continue scheduled followup with Dr. Haroldine Laws, and we will arrange return followup with Dr. Dannielle Burn in 6 months.

## 2011-09-27 NOTE — Assessment & Plan Note (Signed)
Continue followup here in our [place] clinic, as scheduled.

## 2011-09-27 NOTE — Patient Instructions (Signed)
Continue all current medications. Your physician wants you to follow up in: 6 months.  You will receive a reminder letter in the mail one-two months in advance.  If you don't receive a letter, please call our office to schedule the follow up appointment

## 2011-09-27 NOTE — Assessment & Plan Note (Signed)
Patient is awaiting final scheduling for a repeat R heart catheterization, per Dr. Clayborne Dana recommendation, for reassessment of severe PHTN.

## 2011-09-27 NOTE — Progress Notes (Signed)
HPI: Patient presents to establish in our office, following initial consultation at [facility], by Dr. Dannielle Burn, in July 2012.  She presented with no known history of heart disease, and was found to have evidence of severe pulmonary hypertension, secondary to ILD/bronchiolitis. There was no definite evidence of chronic thromboembolic disease. She was transferred to [facility] for further evaluation, and underwent a R./L. heart catheterization, by Dr. Haroldine Laws, which yielded normal coronaries, EF 55%; severe systemic PHTN (pulmonary vascular resistance: approximately 13 Woods units), with no obvious unidirectional shunt. He concluded that she had a very poor prognosis, and was referred for formal PUL consultation. PFTs indicated severe obstructive and restrictive lung disease, with no response to bronchodilators. It was felt that her pulmonary retention was out of proportion to her lung disease. Arrangements were made for outpatient pulmonary rehabilitation, here in [place], and followup with Dr. Linde Gillis.  Regarding medications, she was initially started on Revatio, but then switched to [place] for improved compliance. Plan was for outpatient home oxygen, as well as BiPAP, and she was also placed on Coumadin, secondary to her severe pulmonary retention and hypoxemia. A VQ scan was done prior to discharge, and negative for PE.  Patient is reportedly doing extremely well, since her hospitalization last July. She is under the care of Drs. Daniel Bensimhon and Linde Gillis, and, in fact, is scheduled to undergo a repeat R heart catheterization, for reassessment of her severe PHTN.  Specifically, she remains on Coumadin anticoagulation, followed here in our [place] clinic, call oxygen as needed, BiPAP, and Adcirca. With respect to heart failure symptoms, she denies PND, orthopnea, or LE edema, and has mild DOE with moderate activity, suggestive of class II heart failure. She denies any exertional chest  pain.  Allergies  Allergen Reactions  . Bactrim     Face and eye swelling  . Penicillins   . Sulfa Antibiotics     Current Outpatient Prescriptions  Medication Sig Dispense Refill  . albuterol (PROVENTIL HFA;VENTOLIN HFA) 108 (90 BASE) MCG/ACT inhaler Inhale 2 puffs into the lungs every 2 (two) hours as needed.  1 Inhaler  1  . albuterol-ipratropium (COMBIVENT) 18-103 MCG/ACT inhaler Inhale 2 puffs into the lungs every 6 (six) hours as needed.  14.7 g  1  . aspirin 81 MG EC tablet Take 81 mg by mouth daily.        . fluticasone (FLOVENT HFA) 220 MCG/ACT inhaler Inhale 1 puff into the lungs 2 (two) times daily.  1 Inhaler  1  . furosemide (LASIX) 40 MG tablet 40 mg. Please take one tab in am and one tab in pm      . metolazone (ZAROXOLYN) 2.5 MG tablet Take 1 tablet (2.5 mg total) by mouth as directed.  5 tablet  0  . omeprazole (PRILOSEC) 20 MG capsule Take 20 mg by mouth daily.        . potassium chloride SA (K-DUR,KLOR-CON) 20 MEQ tablet Take 20 mEq by mouth daily.       Marland Kitchen spironolactone (ALDACTONE) 25 MG tablet Take 0.5 tablets (12.5 mg total) by mouth daily.  30 tablet  3  . Tadalafil, PAH, (ADCIRCA) 20 MG TABS Take 40 mg by mouth daily.       Marland Kitchen warfarin (COUMADIN) 5 MG tablet Take 1 1/2 tablets daily except 1 tablet on Sundays        Past Medical History  Diagnosis Date  . SOB (shortness of breath) on exertion   . Other chronic pulmonary heart diseases   .  Chronic obstructive asthma with exacerbation     CT scan was a pattern but no evidence of pulmonary embolus and., Negative VQ scan July 2012 pulmonary function test FVC 1.17 L per minute 37% of predicted, FEV1 1 L 40% of predicted. DLCO 61% of predicted no response to bronchodilators negative HIV serology, ANA in scleroderma.  . Tobacco use disorder   . Obesity, unspecified   . Other and unspecified hyperlipidemia   . Cardiomegaly     Normal LV systolic function ejection fraction 55-60% with moderate decrease in RV function.   . Other diseases of lung, not elsewhere classified   . Hepatomegaly   . Body mass index 40.0-44.9, adult   . Edema   . Pulmonary arterial hypertension      PA pressure 112/54 mmHg the mean of 74 mmHg, right atrial pressure mean 25 mmHg. Pulmonary vascular resistance 13 with units. Cardiac output 5.8 L. Normal coronary arteries with normal LV function July 2012   . Obstructive sleep apnea     BiPAP initiated July 2012    History   Social History  . Marital Status: Married    Spouse Name: N/A    Number of Children: N/A  . Years of Education: N/A   Occupational History  . RETIRED    Social History Main Topics  . Smoking status: Former Smoker -- 1.0 packs/day for 40 years    Types: Cigarettes    Quit date: 03/30/2011  . Smokeless tobacco: Never Used  . Alcohol Use: No  . Drug Use: No  . Sexually Active: Not on file   Other Topics Concern  . Not on file   Social History Narrative  . No narrative on file    Family History  Problem Relation Age of Onset  . Hypertension Mother   . Heart failure Brother     ROS: no nausea, vomiting; no fever, chills; no melena, hematochezia; no claudication  PHYSICAL EXAM:  There were no vitals taken for this visit. GENERAL: 56 year old female, obese, sitting upright; NAD HEENT: NCAT, PERRLA, EOMI; sclera clear; no xanthelasma NECK: palpable bilateral carotid pulses, no bruits; unable to assess JVD, secondary to neck girth LUNGS: Diminished breath sounds in bases, no crackles/wheezes CARDIAC: RRR (S1, S2); no significant murmurs; no rubs or gallops ABDOMEN: Protuberant EXTREMETIES: no significant peripheral edema SKIN: warm/dry; no obvious rash/lesions MUSCULOSKELETAL: no joint deformity NEURO: no focal deficit; NL affect   EKG:    ASSESSMENT & PLAN:

## 2011-09-28 ENCOUNTER — Telehealth (HOSPITAL_COMMUNITY): Payer: Self-pay | Admitting: *Deleted

## 2011-09-28 NOTE — Telephone Encounter (Signed)
Message copied by Scarlette Calico on Tue Sep 28, 2011  4:22 PM ------      Message from: Jennette Bill      Created: Tue Sep 28, 2011 10:49 AM       Please call Ms Preslar regarding her cath.  She has multiple appts and wants to make sure to get it done. Thanks.

## 2011-09-28 NOTE — Telephone Encounter (Signed)
Pt cx'd cath on Fri due to weather, rescheduled for Tue 1/24 at 12:30 pt aware and instructions reviewed w/her

## 2011-09-30 ENCOUNTER — Encounter (HOSPITAL_BASED_OUTPATIENT_CLINIC_OR_DEPARTMENT_OTHER): Payer: Self-pay | Admitting: Internal Medicine

## 2011-09-30 ENCOUNTER — Encounter (HOSPITAL_BASED_OUTPATIENT_CLINIC_OR_DEPARTMENT_OTHER)
Admission: RE | Disposition: A | Discharge status: Home / Self Care | Payer: Self-pay | Source: Ambulatory Visit | Attending: Internal Medicine

## 2011-09-30 ENCOUNTER — Inpatient Hospital Stay (HOSPITAL_BASED_OUTPATIENT_CLINIC_OR_DEPARTMENT_OTHER)
Admission: RE | Admit: 2011-09-30 | Discharge: 2011-09-30 | Disposition: A | Discharge status: Home / Self Care | Payer: Medicaid - Out of State | Source: Ambulatory Visit | Attending: Internal Medicine | Admitting: Internal Medicine

## 2011-09-30 DIAGNOSIS — I2789 Other specified pulmonary heart diseases: Secondary | ICD-10-CM | POA: Insufficient documentation

## 2011-09-30 DIAGNOSIS — F17200 Nicotine dependence, unspecified, uncomplicated: Secondary | ICD-10-CM | POA: Insufficient documentation

## 2011-09-30 DIAGNOSIS — J4489 Other specified chronic obstructive pulmonary disease: Secondary | ICD-10-CM | POA: Insufficient documentation

## 2011-09-30 DIAGNOSIS — G4733 Obstructive sleep apnea (adult) (pediatric): Secondary | ICD-10-CM | POA: Insufficient documentation

## 2011-09-30 DIAGNOSIS — E785 Hyperlipidemia, unspecified: Secondary | ICD-10-CM | POA: Insufficient documentation

## 2011-09-30 DIAGNOSIS — E669 Obesity, unspecified: Secondary | ICD-10-CM | POA: Insufficient documentation

## 2011-09-30 DIAGNOSIS — J449 Chronic obstructive pulmonary disease, unspecified: Secondary | ICD-10-CM | POA: Insufficient documentation

## 2011-09-30 DIAGNOSIS — I517 Cardiomegaly: Secondary | ICD-10-CM | POA: Insufficient documentation

## 2011-09-30 DIAGNOSIS — I279 Pulmonary heart disease, unspecified: Secondary | ICD-10-CM

## 2011-09-30 DIAGNOSIS — J984 Other disorders of lung: Secondary | ICD-10-CM | POA: Insufficient documentation

## 2011-09-30 DIAGNOSIS — Z6841 Body Mass Index (BMI) 40.0 and over, adult: Secondary | ICD-10-CM | POA: Insufficient documentation

## 2011-09-30 LAB — POCT I-STAT 3, VENOUS BLOOD GAS (G3P V)
Acid-Base Excess: 1 mmol/L (ref 0.0–2.0)
Acid-base deficit: 2 mmol/L (ref 0.0–2.0)
Bicarbonate: 27.9 mEq/L — ABNORMAL HIGH (ref 20.0–24.0)
O2 Saturation: 68 %
TCO2: 26 mmol/L (ref 0–100)
TCO2: 29 mmol/L (ref 0–100)
pCO2, Ven: 48.3 mmHg (ref 45.0–50.0)

## 2011-09-30 LAB — POCT I-STAT 3, ART BLOOD GAS (G3+)
Bicarbonate: 28.3 mEq/L — ABNORMAL HIGH (ref 20.0–24.0)
O2 Saturation: 95 %
TCO2: 30 mmol/L (ref 0–100)
pCO2 arterial: 46.1 mmHg — ABNORMAL HIGH (ref 35.0–45.0)
pO2, Arterial: 74 mmHg — ABNORMAL LOW (ref 80.0–100.0)

## 2011-09-30 SURGERY — JV RIGHT HEART CATHETERIZATION
Anesthesia: Moderate Sedation

## 2011-09-30 MED ORDER — SODIUM CHLORIDE 0.9 % IV SOLN: 1.0000 mL/kg/h | INTRAVENOUS | Status: DC

## 2011-09-30 MED ORDER — ACETAMINOPHEN 325 MG PO TABS: 650.0000 mg | ORAL_TABLET | ORAL | Status: DC | PRN

## 2011-09-30 MED ORDER — SODIUM CHLORIDE 0.9 % IV SOLN
250.0000 mL | INTRAVENOUS | Status: DC | PRN
  Administered 2011-09-30: 250 mL via INTRAVENOUS

## 2011-09-30 MED ORDER — SODIUM CHLORIDE 0.9 % IJ SOLN: 3.0000 mL | Freq: Two times a day (BID) | INTRAMUSCULAR | Status: DC

## 2011-09-30 MED ORDER — ONDANSETRON HCL 4 MG/2ML IJ SOLN: 4.0000 mg | Freq: Four times a day (QID) | INTRAMUSCULAR | Status: DC | PRN

## 2011-09-30 MED ORDER — SODIUM CHLORIDE 0.9 % IJ SOLN: 3.0000 mL | INTRAMUSCULAR | Status: DC | PRN

## 2011-09-30 NOTE — Interval H&P Note (Signed)
History and Physical Interval Note:  09/30/2011 1:07 PM  Alexis [name]  has presented today for surgery, with the diagnosis of pulmonary HTN  The various methods of treatment have been discussed with the patient and family. After consideration of risks, benefits and other options for treatment, the patient has consented to  Procedure(s): [place] as a surgical intervention .  The patients' history has been reviewed, patient examined, no change in status, stable for surgery.  I have reviewed the patients' chart and labs.  Questions were answered to the patient's satisfaction.     Daniel Bensimhon

## 2011-09-30 NOTE — Progress Notes (Signed)
Discharge instructions completed with patient and husband.  Ambulated to bathroom without bleeding from right groin site.  Discharged to home via wheelchair with husband.

## 2011-09-30 NOTE — Progress Notes (Signed)
Bedrest begins @ 1405, tegaderm dressing applied and a pressure dressing.  Dr. Haroldine Laws in to discuss results with patient and husband.

## 2011-09-30 NOTE — H&P (View-Only) (Signed)
HPI: Patient presents to establish in our office, following initial consultation at [facility], by Dr. DeGent, in July 2012.  She presented with no known history of heart disease, and was found to have evidence of severe pulmonary hypertension, secondary to ILD/bronchiolitis. There was no definite evidence of chronic thromboembolic disease. She was transferred to [facility] for further evaluation, and underwent a R./L. heart catheterization, by Dr. Bensimhon, which yielded normal coronaries, EF 55%; severe systemic PHTN (pulmonary vascular resistance: approximately 13 Woods units), with no obvious unidirectional shunt. He concluded that she had a very poor prognosis, and was referred for formal PUL consultation. PFTs indicated severe obstructive and restrictive lung disease, with no response to bronchodilators. It was felt that her pulmonary retention was out of proportion to her lung disease. Arrangements were made for outpatient pulmonary rehabilitation, here in [place], and followup with Dr. William Henderson.  Regarding medications, she was initially started on Revatio, but then switched to Adcirca for improved compliance. Plan was for outpatient home oxygen, as well as BiPAP, and she was also placed on Coumadin, secondary to her severe pulmonary retention and hypoxemia. A VQ scan was done prior to discharge, and negative for PE.  Patient is reportedly doing extremely well, since her hospitalization last July. She is under the care of Drs. Daniel Bensimhon and William Henderson, and, in fact, is scheduled to undergo a repeat R heart catheterization, for reassessment of her severe PHTN.  Specifically, she remains on Coumadin anticoagulation, followed here in our [place] clinic, call oxygen as needed, BiPAP, and Adcirca. With respect to heart failure symptoms, she denies PND, orthopnea, or LE edema, and has mild DOE with moderate activity, suggestive of class II heart failure. She denies any exertional chest  pain.  Allergies  Allergen Reactions  . Bactrim     Face and eye swelling  . Penicillins   . Sulfa Antibiotics     Current Outpatient Prescriptions  Medication Sig Dispense Refill  . albuterol (PROVENTIL HFA;VENTOLIN HFA) 108 (90 BASE) MCG/ACT inhaler Inhale 2 puffs into the lungs every 2 (two) hours as needed.  1 Inhaler  1  . albuterol-ipratropium (COMBIVENT) 18-103 MCG/ACT inhaler Inhale 2 puffs into the lungs every 6 (six) hours as needed.  14.7 g  1  . aspirin 81 MG EC tablet Take 81 mg by mouth daily.        . fluticasone (FLOVENT HFA) 220 MCG/ACT inhaler Inhale 1 puff into the lungs 2 (two) times daily.  1 Inhaler  1  . furosemide (LASIX) 40 MG tablet 40 mg. Please take one tab in am and one tab in pm      . metolazone (ZAROXOLYN) 2.5 MG tablet Take 1 tablet (2.5 mg total) by mouth as directed.  5 tablet  0  . omeprazole (PRILOSEC) 20 MG capsule Take 20 mg by mouth daily.        . potassium chloride SA (K-DUR,KLOR-CON) 20 MEQ tablet Take 20 mEq by mouth daily.       . spironolactone (ALDACTONE) 25 MG tablet Take 0.5 tablets (12.5 mg total) by mouth daily.  30 tablet  3  . Tadalafil, PAH, (ADCIRCA) 20 MG TABS Take 40 mg by mouth daily.       . warfarin (COUMADIN) 5 MG tablet Take 1 1/2 tablets daily except 1 tablet on Sundays        Past Medical History  Diagnosis Date  . SOB (shortness of breath) on exertion   . Other chronic pulmonary heart diseases   .   Chronic obstructive asthma with exacerbation     CT scan was a pattern but no evidence of pulmonary embolus and., Negative VQ scan July 2012 pulmonary function test FVC 1.17 L per minute 37% of predicted, FEV1 1 L 40% of predicted. DLCO 61% of predicted no response to bronchodilators negative HIV serology, ANA in scleroderma.  . Tobacco use disorder   . Obesity, unspecified   . Other and unspecified hyperlipidemia   . Cardiomegaly     Normal LV systolic function ejection fraction 55-60% with moderate decrease in RV function.   . Other diseases of lung, not elsewhere classified   . Hepatomegaly   . Body mass index 40.0-44.9, adult   . Edema   . Pulmonary arterial hypertension      PA pressure 112/54 mmHg the mean of 74 mmHg, right atrial pressure mean 25 mmHg. Pulmonary vascular resistance 13 with units. Cardiac output 5.8 L. Normal coronary arteries with normal LV function July 2012   . Obstructive sleep apnea     BiPAP initiated July 2012    History   Social History  . Marital Status: Married    Spouse Name: N/A    Number of Children: N/A  . Years of Education: N/A   Occupational History  . RETIRED    Social History Main Topics  . Smoking status: Former Smoker -- 1.0 packs/day for 40 years    Types: Cigarettes    Quit date: 03/30/2011  . Smokeless tobacco: Never Used  . Alcohol Use: No  . Drug Use: No  . Sexually Active: Not on file   Other Topics Concern  . Not on file   Social History Narrative  . No narrative on file    Family History  Problem Relation Age of Onset  . Hypertension Mother   . Heart failure Brother     ROS: no nausea, vomiting; no fever, chills; no melena, hematochezia; no claudication  PHYSICAL EXAM:  There were no vitals taken for this visit. GENERAL: 56-year-old female, obese, sitting upright; NAD HEENT: NCAT, PERRLA, EOMI; sclera clear; no xanthelasma NECK: palpable bilateral carotid pulses, no bruits; unable to assess JVD, secondary to neck girth LUNGS: Diminished breath sounds in bases, no crackles/wheezes CARDIAC: RRR (S1, S2); no significant murmurs; no rubs or gallops ABDOMEN: Protuberant EXTREMETIES: no significant peripheral edema SKIN: warm/dry; no obvious rash/lesions MUSCULOSKELETAL: no joint deformity NEURO: no focal deficit; NL affect   EKG:    ASSESSMENT & PLAN:   

## 2011-09-30 NOTE — Op Note (Signed)
Cardiac Cath Procedure Note:  Indication:   Pulmonary HTN  Procedures performed:  1) Right heart catheterization  Description of procedure:   The risks and indication of the procedure were explained. Consent was signed and placed on the chart. An appropriate timeout was taken prior to the procedure. The right groin was prepped and draped in the routine sterile fashion and anesthetized with 1% local lidocaine.   A 7 FR venous sheath was placed in the right femoral vein using a modified Seldinger technique. A standard Swan-Ganz catheter was used for the procedure.   Complications: None apparent.  Findings:  RA =  10  RV = 103/14/14 PA =101/44 (68) PCW = 19 Fick cardiac output/index = 4.7/2.4 PVR = 10.3 FA sat = 95% (2L) PA sat = 68%, 70%  Assessment:  1) Severe PAH with mild improvement on tadalafil. (Previous PAP ~ 115 with PVR 13)  Plan/Discussion:  Will add Ventavis. Continue weight loss and supplemental O2.   Daniel Bensimhon 1:49 PM

## 2011-10-01 ENCOUNTER — Encounter: Payer: Medicaid - Out of State | Admitting: *Deleted

## 2011-10-08 ENCOUNTER — Telehealth (HOSPITAL_COMMUNITY): Payer: Self-pay | Admitting: *Deleted

## 2011-10-08 ENCOUNTER — Ambulatory Visit (INDEPENDENT_AMBULATORY_CARE_PROVIDER_SITE_OTHER): Payer: Self-pay | Admitting: *Deleted

## 2011-10-08 DIAGNOSIS — Z7901 Long term (current) use of anticoagulants: Secondary | ICD-10-CM

## 2011-10-08 NOTE — Telephone Encounter (Signed)
Alexis [name] called today regarding her inhalers.  She is waiting to hear back from you. Thanks!

## 2011-10-12 MED ORDER — FUROSEMIDE 40 MG PO TABS: 40.0000 mg | ORAL_TABLET | Freq: Two times a day (BID) | ORAL | Status: DC

## 2011-10-12 NOTE — Telephone Encounter (Signed)
Pt needs to be started on Ventavis, am working on paperwork, she will come by 2/6 to sign forms

## 2011-10-13 NOTE — Telephone Encounter (Signed)
Pt signed forms will have Dr Haroldine Laws complete and will fax in tomorrow

## 2011-11-01 ENCOUNTER — Ambulatory Visit (HOSPITAL_COMMUNITY)
Admission: RE | Admit: 2011-11-01 | Discharge: 2011-11-01 | Disposition: A | Discharge status: Home / Self Care | Payer: Self-pay | Source: Ambulatory Visit | Attending: Internal Medicine | Admitting: Internal Medicine

## 2011-11-01 ENCOUNTER — Encounter (HOSPITAL_COMMUNITY): Payer: Self-pay

## 2011-11-01 VITALS — BP 112/70 | HR 97 | Wt 226.4 lb

## 2011-11-01 DIAGNOSIS — I2789 Other specified pulmonary heart diseases: Secondary | ICD-10-CM | POA: Insufficient documentation

## 2011-11-01 DIAGNOSIS — I2721 Secondary pulmonary arterial hypertension: Secondary | ICD-10-CM

## 2011-11-01 NOTE — Patient Instructions (Addendum)
Do the following things EVERYDAY: 1) Weigh yourself in the morning before breakfast. Write it down and keep it in a log. 2) Take your medicines as prescribed 3) Eat low salt foods-Limit salt (sodium) to 2085m per day.  4) Stay as active as you can everyday  Follow up 6 weeks

## 2011-11-01 NOTE — Assessment & Plan Note (Signed)
Weight is up however volume status appears stable. Continue Lasix 40 mg twice a day. Discussed medication compliance, exercise and low salt diet choices.

## 2011-11-01 NOTE — Progress Notes (Signed)
Patient ID: Alexis [name], female   DOB: 1956/02/02, 56 y.o.   MRN: [id number]  HPI The patient is a 56 year old female with history of PAH and diastolic dysfunction.   Initial presentation was to [facility] with acute on chronic respiratory failure. Based on the CT scan it was felt that the patient have severe pulmonary hypertension and was referred for diagnostic cardiac catheterization. Her PA pressures and mean pulmonary artery pressure were markedly elevated requiring initiation of oxygen therapy, anticoagulation and oral tadalafil. She had no significant coronary artery disease. She was found to have severe obstructive/restrictive lung disease but with very little response to bronchodilator therapy. She was then subsequently referred to Dr. Koleen Nimrod for followup.   April 02, 2011. The right atrial pressure had a mean of 25, RV pressure is 117/13, PA pressure was 112/54 with mean of 74. Cardiac output was 5.8 liters. Cardiac index of 3.3 liters. Her pulmonary vascular resistance calculated to approximately 13 Woods units. unction with an EF of 55-60% with moderate decrease in her RV Function.   Had echo 10/12 EF 60-65%. RV moderately dilated and mild to moderately hypokinetic with septal flattening RVSP 77  (07/12/11) Repeat 6MW at pulmonary rehab unchanged from August 2012. 900 m. Discharged from pulm rehab.  But has a free 1 month of rehab still to use.   09/30/11 RHC RA = 10  RV = 103/14/14  PA =101/44 (68)  PCW = 19  Fick cardiac output/index = 4.7/2.4  PVR = 10.3  FA sat = 95% (2L)  PA sat = 68%, 70%  She returns for follow up today after RHC. As noted above mild improvement with Adcirca. She is to start [facility] after application is approved. Complains of bloating, joints are sore and sinus discomfort.  No dizziness/ CP. Dyspnea going up hills.  Walking with rolling walker. Weight at home 218-220. Limiting sodium intake. Completed pulmonary rehab 07/2011.      Allergies  Allergen Reactions  . Bactrim     Face and eye swelling  . Morphine And Related Other (See Comments)    Severe headache  . Penicillins   . Sulfa Antibiotics     Current Outpatient Prescriptions on File Prior to Encounter  Medication Sig Dispense Refill  . albuterol (PROVENTIL HFA;VENTOLIN HFA) 108 (90 BASE) MCG/ACT inhaler Inhale 2 puffs into the lungs every 2 (two) hours as needed.  1 Inhaler  1  . albuterol-ipratropium (COMBIVENT) 18-103 MCG/ACT inhaler Inhale 2 puffs into the lungs every 6 (six) hours as needed.  14.7 g  1  . aspirin 81 MG EC tablet Take 81 mg by mouth daily.        . fluticasone (FLOVENT HFA) 220 MCG/ACT inhaler Inhale 1 puff into the lungs 2 (two) times daily.  1 Inhaler  1  . furosemide (LASIX) 40 MG tablet Take 1 tablet (40 mg total) by mouth 2 (two) times daily.  60 tablet  6  . NON FORMULARY Place 2 L/min into the nose as needed. Oxygen      . omeprazole (PRILOSEC) 20 MG capsule Take 20 mg by mouth daily.        . potassium chloride SA (K-DUR,KLOR-CON) 20 MEQ tablet Take 20 mEq by mouth daily.       Marland Kitchen spironolactone (ALDACTONE) 25 MG tablet Take 0.5 tablets (12.5 mg total) by mouth daily.  30 tablet  3  . Tadalafil, PAH, (ADCIRCA) 20 MG TABS Take 40 mg by mouth daily.       Marland Kitchen  warfarin (COUMADIN) 5 MG tablet Take 1 1/2 tablets daily except 1 tablet on Sundays         Past Medical History  Diagnosis Date  . SOB (shortness of breath) on exertion   . Other chronic pulmonary heart diseases   . Chronic obstructive asthma with exacerbation     CT scan was a pattern but no evidence of pulmonary embolus and., Negative VQ scan July 2012 pulmonary function test FVC 1.17 L per minute 37% of predicted, FEV1 1 L 40% of predicted. DLCO 61% of predicted no response to bronchodilators negative HIV serology, ANA in scleroderma.  . Tobacco use disorder   . Obesity, unspecified   . Other and unspecified hyperlipidemia   . Cardiomegaly     Normal LV systolic  function ejection fraction 55-60% with moderate decrease in RV function.  . Other diseases of lung, not elsewhere classified   . Hepatomegaly   . Body mass index 40.0-44.9, adult   . Edema   . Pulmonary arterial hypertension      PA pressure 112/54 mmHg the mean of 74 mmHg, right atrial pressure mean 25 mmHg. Pulmonary vascular resistance 13 with units. Cardiac output 5.8 L. Normal coronary arteries with normal LV function July 2012   . Obstructive sleep apnea     BiPAP initiated July 2012    Past Surgical History  Procedure Date  . Cesarean section   . Tubal ligation   . Dilation and curettage of uterus   . Cardiac catheterization 04/02/2011    Family History  Problem Relation Age of Onset  . Hypertension Mother   . Heart failure Brother     History   Social History  . Marital Status: Married    Spouse Name: N/A    Number of Children: N/A  . Years of Education: N/A   Occupational History  . RETIRED    Social History Main Topics  . Smoking status: Former Smoker -- 1.0 packs/day for 40 years    Types: Cigarettes    Quit date: 03/30/2011  . Smokeless tobacco: Never Used  . Alcohol Use: No  . Drug Use: No  . Sexually Active: Not on file   Other Topics Concern  . Not on file   Social History Narrative  . No narrative on file   ELY:HTMBPJPET positives as outlined above. The remainder of the 18  point review of systems is negative  PHYSICAL EXAM BP 112/70  Pulse 97  Wt 226 lb 6.4 oz (102.694 kg)  SpO2 94% Weight 226 (218) General: Well appearing. No respiratory difficulty, on home O2.  HEENT: normal  Neck: supple. Thick. Hard to assess JVP. Carotids 2+ bilat; no bruits. No lymphadenopathy or thryomegaly appreciated.  Cor: PMI nonpalpable. Distant. Regular rate and rhythm/ rubs, gallops or murmurs. Lungs: clear   Abdomen: obese, + distended. nontender No hepatosplenomegaly. No bruits or masses. Good bowel sounds.  Extremities: no cyanosis, clubbing, rash, RLE  and LLE trace edema Neuro: alert & oriented x 3, cranial nerves grossly intact. moves all 4 extremities w/o difficulty. Affect pleasant.   ASSESSMENT AND PLAN

## 2011-11-01 NOTE — Assessment & Plan Note (Addendum)
Result of RHC revealed mild improvement pulmonary hypertension but numbers still at systemic level PAP ~ 100. Recommended Ventavis. Application for Ventavis completed and approval is pending. Continue on home oxygen. Will contact Pulmonary Rehab for maintenance program.   Patient seen and examined with Darrick Grinder, NP. We discussed all aspects of the encounter. I agree with the assessment and plan as stated above. Exam is stable with no volume overload. Prominent P2. PA Pressures still very high on cath with persistent RH strain on echo. Will complete Ventavis application today. Will investigate pulmonary rehab maintenance program.

## 2011-11-04 ENCOUNTER — Other Ambulatory Visit: Payer: Self-pay | Admitting: Cardiology

## 2011-11-09 ENCOUNTER — Telehealth (HOSPITAL_COMMUNITY): Payer: Self-pay | Admitting: *Deleted

## 2011-11-09 NOTE — Telephone Encounter (Signed)
Ms Storey called today, she took said that she is feeling bloated, she took a pill yesterday, however, her weight is the same and she still feels bloated.  Please follow up with her. Thanks.

## 2011-11-09 NOTE — Telephone Encounter (Signed)
Spoke w/pt she states she feels bloated like her stomach is swollen, her wt is pretty stable it is only up 1 lb from last week, she took an extra 40 mg of Lasix yesterday and it did not help, she will take an extra 40 mg today and tomorrow and let me know if not feeling better

## 2011-11-19 ENCOUNTER — Ambulatory Visit (INDEPENDENT_AMBULATORY_CARE_PROVIDER_SITE_OTHER): Payer: Self-pay | Admitting: *Deleted

## 2011-11-19 DIAGNOSIS — Z7901 Long term (current) use of anticoagulants: Secondary | ICD-10-CM

## 2011-11-19 DIAGNOSIS — I272 Pulmonary hypertension, unspecified: Secondary | ICD-10-CM

## 2011-11-19 LAB — POCT INR: INR: 2

## 2011-12-08 ENCOUNTER — Telehealth (HOSPITAL_COMMUNITY): Payer: Self-pay | Admitting: *Deleted

## 2011-12-08 NOTE — Telephone Encounter (Signed)
Please call patient-she started new therapy last week and needs to talk with [facility].

## 2011-12-09 NOTE — Telephone Encounter (Signed)
Spoke w/pt she states she started Ventavis last week, and she feels like she has a sinus infection and would like an antibiotic called in for her, advised we don not usually call in antibiotics she should see her pcp she states her pcp left and she has not established with anyone else yet, she states she doesn't want to go to the walk-in clinic b/c she doesn't know the MDs, advised Dr Haroldine Laws is out of town and we can not call in antibiotic she needs to f/u w/walk-in clinic

## 2011-12-17 ENCOUNTER — Ambulatory Visit (INDEPENDENT_AMBULATORY_CARE_PROVIDER_SITE_OTHER): Payer: Self-pay | Admitting: *Deleted

## 2011-12-17 DIAGNOSIS — I2789 Other specified pulmonary heart diseases: Secondary | ICD-10-CM

## 2011-12-17 DIAGNOSIS — Z7901 Long term (current) use of anticoagulants: Secondary | ICD-10-CM

## 2011-12-17 DIAGNOSIS — I272 Pulmonary hypertension, unspecified: Secondary | ICD-10-CM

## 2011-12-17 LAB — POCT INR: INR: 1.8

## 2011-12-21 ENCOUNTER — Ambulatory Visit (HOSPITAL_COMMUNITY)
Admission: RE | Admit: 2011-12-21 | Discharge: 2011-12-21 | Disposition: A | Discharge status: Home / Self Care | Payer: Self-pay | Source: Ambulatory Visit | Attending: Internal Medicine | Admitting: Internal Medicine

## 2011-12-21 VITALS — BP 112/68 | HR 97 | Wt 229.8 lb

## 2011-12-21 DIAGNOSIS — I2721 Secondary pulmonary arterial hypertension: Secondary | ICD-10-CM

## 2011-12-21 DIAGNOSIS — R0602 Shortness of breath: Secondary | ICD-10-CM | POA: Insufficient documentation

## 2011-12-21 DIAGNOSIS — I2789 Other specified pulmonary heart diseases: Secondary | ICD-10-CM | POA: Insufficient documentation

## 2011-12-21 LAB — PRO B NATRIURETIC PEPTIDE: Pro B Natriuretic peptide (BNP): 155.9 pg/mL — ABNORMAL HIGH (ref 0–125)

## 2011-12-21 LAB — BASIC METABOLIC PANEL
CO2: 27 mEq/L (ref 19–32)
Calcium: 9.8 mg/dL (ref 8.4–10.5)
GFR calc Af Amer: >90 mL/min (ref >90)
GFR calc non Af Amer: >90 mL/min (ref >90)
Sodium: 138 mEq/L (ref 135–145)

## 2011-12-21 NOTE — Assessment & Plan Note (Signed)
Having trouble tolerating Ventavis. Will try for one more month and see if it improves. Also has some mild volume overload will increase lasix to 80/40. Check labs today and in 10 days. See back in 1 month. Try ans stay active.

## 2011-12-21 NOTE — Progress Notes (Signed)
Patient ID: Alexis [name], female   DOB: December 31, 1955, 56 y.o.   MRN: [id number]  HPI The patient is a 56 year old female with history of PAH and diastolic dysfunction.   Initial presentation was to [facility] with acute on chronic respiratory failure. Based on the CT scan it was felt that the patient have severe pulmonary hypertension and was referred for diagnostic cardiac catheterization. Her PA pressures and mean pulmonary artery pressure were markedly elevated requiring initiation of oxygen therapy, anticoagulation and oral tadalafil. She had no significant coronary artery disease. She was found to have severe obstructive/restrictive lung disease but with very little response to bronchodilator therapy. She was then subsequently referred to Dr. Koleen Nimrod for followup.   April 02, 2011. The right atrial pressure had a mean of 25, RV pressure is 117/13, PA pressure was 112/54 with mean of 74. Cardiac output was 5.8 liters. Cardiac index of 3.3 liters. Her pulmonary vascular resistance calculated to approximately 13 Woods units. unction with an EF of 55-60% with moderate decrease in her RV Function.   Had echo 10/12 EF 60-65%. RV moderately dilated and mild to moderately hypokinetic with septal flattening RVSP 77  (07/12/11) Repeat 6MW at pulmonary rehab unchanged from August 2012. 900 m. Discharged from pulm rehab.  But has a free 1 month of rehab still to use.   09/30/11 RHC RA = 10  RV = 103/14/14  PA =101/44 (68)  PCW = 19  Fick cardiac output/index = 4.7/2.4  PVR = 10.3 Woods FA sat = 95% (2L)  PA sat = 68%, 70%  At list visit we recommended Ventavis. Started March 26. Initially treatments took 20 minutes not 10-15 minutes. Feels more tired. No difference in breathing. Stopped taking for past 2 days and now feels better. No orthopnea or PND. Weight continues to go up. Now up 3 pounds, No edema, orthopnea or PND. Not wearing CPAP regularly. Takes extra lasix about 2 or 3x week.  Gets good diuresis with lasix.    Allergies  Allergen Reactions  . Bactrim     Face and eye swelling  . Morphine And Related Other (See Comments)    Severe headache  . Penicillins   . Sulfa Antibiotics     Current Outpatient Prescriptions on File Prior to Encounter  Medication Sig Dispense Refill  . albuterol (PROVENTIL HFA;VENTOLIN HFA) 108 (90 BASE) MCG/ACT inhaler Inhale 2 puffs into the lungs every 2 (two) hours as needed.  1 Inhaler  1  . albuterol-ipratropium (COMBIVENT) 18-103 MCG/ACT inhaler Inhale 2 puffs into the lungs every 6 (six) hours as needed.  14.7 g  1  . aspirin 81 MG EC tablet Take 81 mg by mouth daily.        . fluticasone (FLOVENT HFA) 220 MCG/ACT inhaler Inhale 1 puff into the lungs 2 (two) times daily.  1 Inhaler  1  . furosemide (LASIX) 40 MG tablet Take 1 tablet (40 mg total) by mouth 2 (two) times daily.  60 tablet  6  . NON FORMULARY Place 2 L/min into the nose as needed. Oxygen      . omeprazole (PRILOSEC) 20 MG capsule Take 20 mg by mouth daily.        . potassium chloride SA (K-DUR,KLOR-CON) 20 MEQ tablet Take 20 mEq by mouth daily.       Marland Kitchen spironolactone (ALDACTONE) 25 MG tablet Take 0.5 tablets (12.5 mg total) by mouth daily.  30 tablet  3  . Tadalafil, PAH, (ADCIRCA) 20 MG TABS  Take 40 mg by mouth daily.       Marland Kitchen warfarin (COUMADIN) 5 MG tablet TAKE ONE & ONE-HALF TABLETS BY MOUTH EVERY DAY EXCEPT SUNDAY AND THURSDAYS,TAKE ONE EVERY DAY ON THOSE DAYS  60 tablet  3    Past Medical History  Diagnosis Date  . SOB (shortness of breath) on exertion   . Other chronic pulmonary heart diseases   . Chronic obstructive asthma with exacerbation     CT scan was a pattern but no evidence of pulmonary embolus and., Negative VQ scan July 2012 pulmonary function test FVC 1.17 L per minute 37% of predicted, FEV1 1 L 40% of predicted. DLCO 61% of predicted no response to bronchodilators negative HIV serology, ANA in scleroderma.  . Tobacco use disorder   . Obesity,  unspecified   . Other and unspecified hyperlipidemia   . Cardiomegaly     Normal LV systolic function ejection fraction 55-60% with moderate decrease in RV function.  . Other diseases of lung, not elsewhere classified   . Hepatomegaly   . Body mass index 40.0-44.9, adult   . Edema   . Pulmonary arterial hypertension      PA pressure 112/54 mmHg the mean of 74 mmHg, right atrial pressure mean 25 mmHg. Pulmonary vascular resistance 13 with units. Cardiac output 5.8 L. Normal coronary arteries with normal LV function July 2012   . Obstructive sleep apnea     BiPAP initiated July 2012    Past Surgical History  Procedure Date  . Cesarean section   . Tubal ligation   . Dilation and curettage of uterus   . Cardiac catheterization 04/02/2011    Family History  Problem Relation Age of Onset  . Hypertension Mother   . Heart failure Brother     History   Social History  . Marital Status: Married    Spouse Name: N/A    Number of Children: N/A  . Years of Education: N/A   Occupational History  . RETIRED    Social History Main Topics  . Smoking status: Former Smoker -- 1.0 packs/day for 40 years    Types: Cigarettes    Quit date: 03/30/2011  . Smokeless tobacco: Never Used  . Alcohol Use: No  . Drug Use: No  . Sexually Active: Not on file   Other Topics Concern  . Not on file   Social History Narrative  . No narrative on file   NOM:VEHMCNOBS positives as outlined above. The remainder of the 18  point review of systems is negative  PHYSICAL EXAM BP 112/68  Pulse 97  Wt 229 lb 12 oz (104.214 kg)  SpO2 97% Weight 229 <- 226 <- (218) General: Well appearing. No respiratory difficulty, on home O2.  HEENT: normal  Neck: supple. Thick. Hard to assess JVP. Carotids 2+ bilat; no bruits. No lymphadenopathy or thryomegaly appreciated.  Cor: PMI nonpalpable. Distant. Regular rate and rhythm/ rubs, gallops or murmurs. Lungs: clear   Abdomen: obese, + distended. nontender No  hepatosplenomegaly. No bruits or masses. Good bowel sounds.  Extremities: no cyanosis, clubbing, rash, RLE and LLE trace edema Neuro: alert & oriented x 3, cranial nerves grossly intact. moves all 4 extremities w/o difficulty. Affect pleasant.   ASSESSMENT AND PLAN

## 2011-12-21 NOTE — Progress Notes (Signed)
Encounter addended by: Scarlette Calico, RN on: 12/21/2011  1:24 PM<BR>     Documentation filed: Orders

## 2011-12-21 NOTE — Progress Notes (Signed)
Encounter addended by: Scarlette Calico, RN on: 12/21/2011 12:26 PM<BR>     Documentation filed: Visit Diagnoses, Patient Instructions Section

## 2011-12-21 NOTE — Patient Instructions (Signed)
Labs today and in 10 days at the [facility] physician recommends that you schedule a follow-up appointment in: 1 month

## 2011-12-23 ENCOUNTER — Telehealth (HOSPITAL_COMMUNITY): Payer: Self-pay | Admitting: *Deleted

## 2011-12-23 MED ORDER — OMEPRAZOLE 20 MG PO CPDR: 20.0000 mg | DELAYED_RELEASE_CAPSULE | Freq: Every day | ORAL | Status: DC

## 2011-12-23 NOTE — Telephone Encounter (Signed)
Alexis [name] called today.  She has been taking over the counter Prilosec and would like an order for it, it will cost her $5 instead of $20. She would like it to be called into her local [facility], [phone].  Thanks.

## 2011-12-23 NOTE — Telephone Encounter (Signed)
rx sent into pharmacy, pt aware

## 2011-12-28 ENCOUNTER — Encounter: Payer: Self-pay | Admitting: Internal Medicine

## 2011-12-30 ENCOUNTER — Telehealth (HOSPITAL_COMMUNITY): Payer: Self-pay | Admitting: *Deleted

## 2011-12-30 MED ORDER — FUROSEMIDE 40 MG PO TABS: 40.0000 mg | ORAL_TABLET | Freq: Two times a day (BID) | ORAL | Status: DC

## 2011-12-30 NOTE — Telephone Encounter (Addendum)
Alexis [name] called today. She needs lasix called into [place], she is taking 3 a day.  Thanks.  She is using the [facility] in [place], [place]. Thanks.

## 2011-12-30 NOTE — Telephone Encounter (Signed)
Pt aware rx was sent in

## 2012-01-03 ENCOUNTER — Encounter (HOSPITAL_COMMUNITY): Payer: Self-pay

## 2012-01-18 ENCOUNTER — Telehealth (HOSPITAL_COMMUNITY): Payer: Self-pay | Admitting: *Deleted

## 2012-01-18 MED ORDER — METOLAZONE 2.5 MG PO TABS: 2.5000 mg | ORAL_TABLET | Freq: Every day | ORAL | Status: DC | PRN

## 2012-01-18 NOTE — Telephone Encounter (Signed)
Spoke w/pt she states wt is up a couple of lbs and she feels like her face and hands are swollen, she is taking lasix 80 mg in AM and 40 mg in PM she is out of metolazone, called in new rx for her she will take that for a day, we will see her on Friday for her f/u appt

## 2012-01-18 NOTE — Telephone Encounter (Signed)
Alexis [name] called today.  She is feeling like she is retaining more fluid around her face.  She would like to know if there is anything she can take in addition to what she is already taking. She is using [facility] in [employer] [place]. Thanks.

## 2012-01-21 ENCOUNTER — Ambulatory Visit (HOSPITAL_COMMUNITY)
Admission: RE | Admit: 2012-01-21 | Discharge: 2012-01-21 | Disposition: A | Discharge status: Home / Self Care | Payer: Self-pay | Source: Ambulatory Visit | Attending: Internal Medicine | Admitting: Internal Medicine

## 2012-01-21 VITALS — BP 106/71 | HR 120 | Wt 231.8 lb

## 2012-01-21 DIAGNOSIS — I2789 Other specified pulmonary heart diseases: Secondary | ICD-10-CM | POA: Insufficient documentation

## 2012-01-21 DIAGNOSIS — I2721 Secondary pulmonary arterial hypertension: Secondary | ICD-10-CM

## 2012-01-21 DIAGNOSIS — Z87891 Personal history of nicotine dependence: Secondary | ICD-10-CM | POA: Insufficient documentation

## 2012-01-21 DIAGNOSIS — Z79899 Other long term (current) drug therapy: Secondary | ICD-10-CM | POA: Insufficient documentation

## 2012-01-21 DIAGNOSIS — Z7982 Long term (current) use of aspirin: Secondary | ICD-10-CM | POA: Insufficient documentation

## 2012-01-21 LAB — BASIC METABOLIC PANEL
Calcium: 10.8 mg/dL — ABNORMAL HIGH (ref 8.4–10.5)
GFR calc Af Amer: 69 mL/min — ABNORMAL LOW (ref >90)
GFR calc non Af Amer: 60 mL/min — ABNORMAL LOW (ref >90)
Sodium: 137 mEq/L (ref 135–145)

## 2012-01-21 MED ORDER — METOLAZONE 2.5 MG PO TABS: 2.5000 mg | ORAL_TABLET | ORAL | Status: DC

## 2012-01-21 NOTE — Assessment & Plan Note (Addendum)
She returns  for follow up. Volume status much improved on Metolazone 2.5 mg over the last two days. Continue current diuretics and add Metolazone 2.5 mg every Monday. Check BMET today.  Reinforced daily weight and low salt food choices. Follow up in 6 weeks.

## 2012-01-21 NOTE — Progress Notes (Signed)
Encounter addended by: Scarlette Calico, RN on: 01/21/2012 11:45 AM<BR>     Documentation filed: Orders

## 2012-01-21 NOTE — Patient Instructions (Addendum)
Please take Metolazone 2.5 mg every Monday  Do the following things EVERYDAY: 1) Weigh yourself in the morning before breakfast. Write it down and keep it in a log. 2) Take your medicines as prescribed 3) Eat low salt foods--Limit salt (sodium) to 2000 mg per day.  4) Stay as active as you can everyday 5) Limit all fluids for the day to less than 2 liters  Follow up in 6 weeks

## 2012-01-21 NOTE — Progress Notes (Signed)
Patient ID: Alexis [name], female   DOB: Jan 24, 1956, 56 y.o.   MRN: [id number]  HPI The patient is a 56 year old female with history of PAH and diastolic dysfunction.   Initial presentation was to [facility] with acute on chronic respiratory failure. Based on the CT scan it was felt that the patient have severe pulmonary hypertension and was referred for diagnostic cardiac catheterization. Her PA pressures and mean pulmonary artery pressure were markedly elevated requiring initiation of oxygen therapy, anticoagulation and oral tadalafil. She had no significant coronary artery disease. She was found to have severe obstructive/restrictive lung disease but with very little response to bronchodilator therapy. She was then subsequently referred to Dr. Koleen Nimrod for followup.   April 02, 2011. The right atrial pressure had a mean of 25, RV pressure is 117/13, PA pressure was 112/54 with mean of 74. Cardiac output was 5.8 liters. Cardiac index of 3.3 liters. Her pulmonary vascular resistance calculated to approximately 13 Woods units. unction with an EF of 55-60% with moderate decrease in her RV Function.   Had echo 10/12 EF 60-65%. RV moderately dilated and mild to moderately hypokinetic with septal flattening RVSP 77  (07/12/11) Repeat 6MW at pulmonary rehab unchanged from August 2012. 900 m. Discharged from pulm rehab.  But has a free 1 month of rehab still to use.   09/30/11 RHC RA = 10  RV = 103/14/14  PA =101/44 (68)  PCW = 19  Fick cardiac output/index = 4.7/2.4  PVR = 10.3 Woods FA sat = 95% (2L)  PA sat = 68%, 70%  She returns for follow up. Sh is tolerating Ventavis 5-6 times a day which takes about 6-8 minutes. . Denies SOB/PND/Orthopnea. Denies dizziness. She had additional metolazone 2.5 mg for the last 2 days. Weight at home trending up  238-240 pounds. Chronic oxygen 2  Iiters. Completed pulmonary rehab. Following low salt diet.     Allergies  Allergen Reactions  .  Bactrim     Face and eye swelling  . Morphine And Related Other (See Comments)    Severe headache  . Penicillins     "doesn't work" got a lot as a child that doesn't work. But no systemic reaction.  . Sulfa Antibiotics     Current Outpatient Prescriptions on File Prior to Encounter  Medication Sig Dispense Refill  . albuterol (PROVENTIL HFA;VENTOLIN HFA) 108 (90 BASE) MCG/ACT inhaler Inhale 2 puffs into the lungs every 2 (two) hours as needed.  1 Inhaler  1  . albuterol-ipratropium (COMBIVENT) 18-103 MCG/ACT inhaler Inhale 2 puffs into the lungs every 6 (six) hours as needed.  14.7 g  1  . aspirin 81 MG EC tablet Take 81 mg by mouth daily.        . fluticasone (FLOVENT HFA) 220 MCG/ACT inhaler Inhale 1 puff into the lungs 2 (two) times daily.  1 Inhaler  1  . furosemide (LASIX) 40 MG tablet Take 1 tablet (40 mg total) by mouth 2 (two) times daily. May take extra tab if needed for weight gain  90 tablet  6  . Iloprost (VENTAVIS) 10 MCG/ML SOLN Six times a day      . metolazone (ZAROXOLYN) 2.5 MG tablet Take 1 tablet (2.5 mg total) by mouth daily as needed.  5 tablet  3  . NON FORMULARY Place 2 L/min into the nose as needed. Oxygen      . omeprazole (PRILOSEC) 20 MG capsule Take 1 capsule (20 mg total) by mouth daily.  30 capsule  6  . potassium chloride SA (K-DUR,KLOR-CON) 20 MEQ tablet Take 20 mEq by mouth daily.       Marland Kitchen spironolactone (ALDACTONE) 25 MG tablet Take 0.5 tablets (12.5 mg total) by mouth daily.  30 tablet  3  . Tadalafil, PAH, (ADCIRCA) 20 MG TABS Take 40 mg by mouth daily.       Marland Kitchen warfarin (COUMADIN) 5 MG tablet TAKE ONE & ONE-HALF TABLETS BY MOUTH EVERY DAY EXCEPT SUNDAY AND THURSDAYS,TAKE ONE EVERY DAY ON THOSE DAYS  60 tablet  3    Past Medical History  Diagnosis Date  . SOB (shortness of breath) on exertion   . Other chronic pulmonary heart diseases   . Chronic obstructive asthma with exacerbation     CT scan was a pattern but no evidence of pulmonary embolus and.,  Negative VQ scan July 2012 pulmonary function test FVC 1.17 L per minute 37% of predicted, FEV1 1 L 40% of predicted. DLCO 61% of predicted no response to bronchodilators negative HIV serology, ANA in scleroderma.  . Tobacco use disorder   . Obesity, unspecified   . Other and unspecified hyperlipidemia   . Cardiomegaly     Normal LV systolic function ejection fraction 55-60% with moderate decrease in RV function.  . Other diseases of lung, not elsewhere classified   . Hepatomegaly   . Body mass index 40.0-44.9, adult   . Edema   . Pulmonary arterial hypertension      PA pressure 112/54 mmHg the mean of 74 mmHg, right atrial pressure mean 25 mmHg. Pulmonary vascular resistance 13 with units. Cardiac output 5.8 L. Normal coronary arteries with normal LV function July 2012   . Obstructive sleep apnea     BiPAP initiated July 2012    Past Surgical History  Procedure Date  . Cesarean section   . Tubal ligation   . Dilation and curettage of uterus   . Cardiac catheterization 04/02/2011    Family History  Problem Relation Age of Onset  . Hypertension Mother   . Heart failure Brother     History   Social History  . Marital Status: Married    Spouse Name: N/A    Number of Children: N/A  . Years of Education: N/A   Occupational History  . RETIRED    Social History Main Topics  . Smoking status: Former Smoker -- 1.0 packs/day for 40 years    Types: Cigarettes    Quit date: 03/30/2011  . Smokeless tobacco: Never Used  . Alcohol Use: No  . Drug Use: No  . Sexually Active: Not on file   Other Topics Concern  . Not on file   Social History Narrative  . No narrative on file   QZE:SPQZRAQTM positives as outlined above. The remainder of the 18  point review of systems is negative  PHYSICAL EXAM BP 106/71  Pulse 120  Wt 231 lb 12.8 oz (105.144 kg)  SpO2 94% Weight 231 (229) General: Well appearing. No respiratory difficulty, on home O2.  HEENT: normal  Neck: supple.  Thick. Hard to assess JVP. Carotids 2+ bilat; no bruits. No lymphadenopathy or thryomegaly appreciated.  Cor: PMI nonpalpable. Distant. Regular rate and rhythm/ rubs, gallops or murmurs. Lungs: clear   Abdomen: obese, + distended. nontender No hepatosplenomegaly. No bruits or masses. Good bowel sounds.  Extremities: no cyanosis, clubbing, rash, RLE and LLE  Neuro: alert & oriented x 3, cranial nerves grossly intact. moves all 4 extremities w/o difficulty. Affect  pleasant.   ASSESSMENT AND PLAN

## 2012-01-25 ENCOUNTER — Encounter: Payer: Self-pay | Admitting: Physician Assistant

## 2012-01-25 ENCOUNTER — Other Ambulatory Visit: Payer: Self-pay | Admitting: Physician Assistant

## 2012-02-11 ENCOUNTER — Other Ambulatory Visit (HOSPITAL_COMMUNITY): Payer: Self-pay | Admitting: *Deleted

## 2012-02-11 MED ORDER — POTASSIUM CHLORIDE CRYS ER 20 MEQ PO TBCR: 20.0000 meq | EXTENDED_RELEASE_TABLET | Freq: Two times a day (BID) | ORAL | Status: DC

## 2012-02-22 ENCOUNTER — Ambulatory Visit (INDEPENDENT_AMBULATORY_CARE_PROVIDER_SITE_OTHER): Payer: Self-pay | Admitting: *Deleted

## 2012-02-22 DIAGNOSIS — Z7901 Long term (current) use of anticoagulants: Secondary | ICD-10-CM

## 2012-02-22 DIAGNOSIS — I2789 Other specified pulmonary heart diseases: Secondary | ICD-10-CM

## 2012-02-22 DIAGNOSIS — I272 Pulmonary hypertension, unspecified: Secondary | ICD-10-CM

## 2012-02-22 LAB — POCT INR: INR: 2

## 2012-02-23 ENCOUNTER — Ambulatory Visit (HOSPITAL_COMMUNITY)
Admission: RE | Admit: 2012-02-23 | Discharge: 2012-02-23 | Disposition: A | Discharge status: Home / Self Care | Payer: Self-pay | Source: Ambulatory Visit | Attending: Internal Medicine | Admitting: Internal Medicine

## 2012-02-23 VITALS — BP 110/56 | HR 105 | Wt 236.8 lb

## 2012-02-23 DIAGNOSIS — I2789 Other specified pulmonary heart diseases: Secondary | ICD-10-CM

## 2012-02-23 DIAGNOSIS — I5032 Chronic diastolic (congestive) heart failure: Secondary | ICD-10-CM

## 2012-02-23 DIAGNOSIS — I2721 Secondary pulmonary arterial hypertension: Secondary | ICD-10-CM

## 2012-02-23 NOTE — Assessment & Plan Note (Signed)
Volume status stable, as above continue current regimen.

## 2012-02-23 NOTE — Assessment & Plan Note (Addendum)
Patient's volume status appears ok.  Dyspnea is at baseline.  Her cough is improving after finishing antibiotics.  Will restart ventavis this week.  Will hold off on 6 min walk this visit due to patient has been off ventavis.  Will have her follow up in 6 weeks after she has restarted ventavis and complete 6 min walk at that time. Patient agrees to the plan.

## 2012-02-23 NOTE — Patient Instructions (Addendum)
Follow up 6 weeks with 6 minute walk test.

## 2012-02-23 NOTE — Progress Notes (Signed)
HPI The patient is a 56 year old female with history of PAH and diastolic dysfunction.   Initial presentation was to [facility] with acute on chronic respiratory failure. Based on the CT scan it was felt that the patient have severe pulmonary hypertension and was referred for diagnostic cardiac catheterization. Her PA pressures and mean pulmonary artery pressure were markedly elevated requiring initiation of oxygen therapy, anticoagulation and oral tadalafil. She had no significant coronary artery disease. She was found to have severe obstructive/restrictive lung disease but with very little response to bronchodilator therapy. She was then subsequently referred to Dr. Koleen Nimrod for followup.   April 02, 2011. The right atrial pressure had a mean of 25, RV pressure is 117/13, PA pressure was 112/54 with mean of 74. Cardiac output was 5.8 liters. Cardiac index of 3.3 liters. Her pulmonary vascular resistance calculated to approximately 13 Woods units. unction with an EF of 55-60% with moderate decrease in her RV Function.   Had echo 06/2011 EF 60-65%. RV moderately dilated and mild to moderately hypokinetic with septal flattening RVSP 77  (07/12/11) Repeat 6MW at pulmonary rehab unchanged from August 2012. 900 m.  Discharged from pulm rehab.  But has a free 1 month of rehab still to use.   09/30/11 RHC RA = 10  RV = 103/14/14  PA =101/44 (68)  PCW = 19  Fick cardiac output/index = 4.7/2.4  PVR = 10.3 Woods FA sat = 95% (2L)  PA sat = 68%, 70%  She returns for follow up today.  She feels ok today.  She stopped using ventavis 1-2 weeks ago due to URI, placed on abx and finished end of last week.  Weight 231-232 at home.  Not using metolazone over last 2 weeks because she hasn't felt the need.  Using 2 liters O2 with ambulation.  Trying to follow low sodium diet.  Her breathing is at baseline.  Denies orthopnea/PND.   No syncope/presyncope.   Allergies  Allergen Reactions  . Bactrim    Face and eye swelling  . Morphine And Related Other (See Comments)    Severe headache  . Penicillins     "doesn't work" got a lot as a child that doesn't work. But no systemic reaction.  . Sulfa Antibiotics     Current Outpatient Prescriptions on File Prior to Encounter  Medication Sig Dispense Refill  . albuterol (PROVENTIL HFA;VENTOLIN HFA) 108 (90 BASE) MCG/ACT inhaler Inhale 2 puffs into the lungs every 2 (two) hours as needed.  1 Inhaler  1  . albuterol-ipratropium (COMBIVENT) 18-103 MCG/ACT inhaler Inhale 2 puffs into the lungs every 6 (six) hours as needed.  14.7 g  1  . aspirin 81 MG EC tablet Take 81 mg by mouth daily.        . fluticasone (FLOVENT HFA) 220 MCG/ACT inhaler Inhale 1 puff into the lungs 2 (two) times daily.  1 Inhaler  1  . furosemide (LASIX) 40 MG tablet 2 tablets in the AM, 1 tablet in the PM; May take extra tab if needed for weight gain      . Iloprost (VENTAVIS) 10 MCG/ML SOLN Five to Six times a day      . metolazone (ZAROXOLYN) 2.5 MG tablet Take 1 tablet (2.5 mg total) by mouth every Monday.  6 tablet  3  . NON FORMULARY Place 2 L/min into the nose as needed. Oxygen      . omeprazole (PRILOSEC) 20 MG capsule Take 1 capsule (20 mg total) by mouth daily.  30 capsule  6  . spironolactone (ALDACTONE) 25 MG tablet Take 0.5 tablets (12.5 mg total) by mouth daily.  30 tablet  3  . Tadalafil, PAH, (ADCIRCA) 20 MG TABS Take 40 mg by mouth daily.       Marland Kitchen warfarin (COUMADIN) 5 MG tablet       . DISCONTD: potassium chloride SA (K-DUR,KLOR-CON) 20 MEQ tablet Take 1 tablet (20 mEq total) by mouth 2 (two) times daily.  60 tablet  6    Past Medical History  Diagnosis Date  . SOB (shortness of breath) on exertion   . Other chronic pulmonary heart diseases   . Chronic obstructive asthma with exacerbation     CT scan was a pattern but no evidence of pulmonary embolus and., Negative VQ scan July 2012 pulmonary function test FVC 1.17 L per minute 37% of predicted, FEV1 1 L 40%  of predicted. DLCO 61% of predicted no response to bronchodilators negative HIV serology, ANA in scleroderma.  . Tobacco use disorder   . Obesity, unspecified   . Other and unspecified hyperlipidemia   . Cardiomegaly     Normal LV systolic function ejection fraction 55-60% with moderate decrease in RV function.  . Other diseases of lung, not elsewhere classified   . Hepatomegaly   . Body mass index 40.0-44.9, adult   . Edema   . Pulmonary arterial hypertension      PA pressure 112/54 mmHg the mean of 74 mmHg, right atrial pressure mean 25 mmHg. Pulmonary vascular resistance 13 with units. Cardiac output 5.8 L. Normal coronary arteries with normal LV function July 2012   . Obstructive sleep apnea     BiPAP initiated July 2012    HCW:CBJSEGBTD positives as outlined above. The remainder of the 18  point review of systems is negative  PHYSICAL EXAM Filed Vitals:   02/23/12 1025  BP: 110/56  Pulse: 105  Weight: 236 lb 12 oz (107.389 kg)  SpO2: 95%   General: Well appearing. No respiratory difficulty, on home O2.  HEENT: normal  Neck: supple. Thick. Hard to assess JVP. Carotids 2+ bilat; no bruits. No lymphadenopathy or thryomegaly appreciated.  Cor: PMI nonpalpable. Distant. Regular rate and rhythm/ rubs, gallops or murmurs. Lungs: clear   Abdomen: obese, nondistended. nontender No hepatosplenomegaly. No bruits or masses. Good bowel sounds.  Extremities: no cyanosis, clubbing, rash, trace edema.   Neuro: alert & oriented x 3, cranial nerves grossly intact. moves all 4 extremities w/o difficulty. Affect pleasant.   ASSESSMENT AND PLAN

## 2012-02-25 ENCOUNTER — Other Ambulatory Visit: Payer: Self-pay | Admitting: *Deleted

## 2012-02-25 ENCOUNTER — Encounter (HOSPITAL_COMMUNITY): Payer: Self-pay

## 2012-02-25 MED ORDER — SPIRONOLACTONE 25 MG PO TABS: 12.5000 mg | ORAL_TABLET | Freq: Every day | ORAL | Status: DC

## 2012-03-06 ENCOUNTER — Telehealth (HOSPITAL_COMMUNITY): Payer: Self-pay | Admitting: *Deleted

## 2012-03-06 MED ORDER — TADALAFIL (PAH) 20 MG PO TABS: 40.0000 mg | ORAL_TABLET | Freq: Every day | ORAL | Status: DC

## 2012-03-06 NOTE — Telephone Encounter (Signed)
Called rx into [facility] at [phone], pt aware and she states she has completed forms for her renewal into the pt assistance program and she will mail them to [place] to be completed and signed by Dr Haroldine Laws

## 2012-03-06 NOTE — Telephone Encounter (Signed)
Alexis [name] called today. She needs an order faxed to [facility] for adcirca 20 mg bid. She needs just one month.  Please follow up and her and [facility].  Thanks.

## 2012-04-06 ENCOUNTER — Encounter (HOSPITAL_COMMUNITY): Payer: Self-pay

## 2012-04-11 ENCOUNTER — Ambulatory Visit (INDEPENDENT_AMBULATORY_CARE_PROVIDER_SITE_OTHER): Payer: Self-pay | Admitting: *Deleted

## 2012-04-11 DIAGNOSIS — I272 Pulmonary hypertension, unspecified: Secondary | ICD-10-CM

## 2012-04-11 DIAGNOSIS — Z7901 Long term (current) use of anticoagulants: Secondary | ICD-10-CM

## 2012-04-11 DIAGNOSIS — I2789 Other specified pulmonary heart diseases: Secondary | ICD-10-CM

## 2012-04-11 LAB — POCT INR: INR: 1.7

## 2012-04-27 ENCOUNTER — Ambulatory Visit (HOSPITAL_COMMUNITY)
Admission: RE | Admit: 2012-04-27 | Discharge: 2012-04-27 | Disposition: A | Discharge status: Home / Self Care | Payer: Self-pay | Source: Ambulatory Visit | Attending: Internal Medicine | Admitting: Internal Medicine

## 2012-04-27 ENCOUNTER — Encounter (HOSPITAL_COMMUNITY): Payer: Self-pay

## 2012-04-27 VITALS — BP 104/70 | HR 114 | Ht 62.0 in | Wt 232.0 lb

## 2012-04-27 DIAGNOSIS — I5032 Chronic diastolic (congestive) heart failure: Secondary | ICD-10-CM | POA: Insufficient documentation

## 2012-04-27 DIAGNOSIS — I2721 Secondary pulmonary arterial hypertension: Secondary | ICD-10-CM

## 2012-04-27 DIAGNOSIS — I2789 Other specified pulmonary heart diseases: Secondary | ICD-10-CM | POA: Insufficient documentation

## 2012-04-27 NOTE — Assessment & Plan Note (Addendum)
Volume status stable. Continue current diuretic regimen.   Attending: Reinforced need for daily weights and reviewed use of sliding scale diuretics.

## 2012-04-27 NOTE — Progress Notes (Signed)
Patient ID: Alexis [name], female   DOB: 07-09-1956, 56 y.o.   MRN: [id number] HPI Alexis [name] is a 56 year old female with history of PAH and diastolic dysfunction.   Initial presentation was to [facility] [name] with acute on chronic respiratory failure. Based on the CT scan it was felt that the patient have severe pulmonary hypertension and was referred for diagnostic cardiac catheterization. Her PA pressures and mean pulmonary artery pressure were markedly elevated requiring initiation of oxygen therapy, anticoagulation and oral tadalafil. She had no significant coronary artery disease. She was found to have severe obstructive/restrictive lung disease but with very little response to bronchodilator therapy. She was then subsequently referred to Dr. Koleen [name] for followup.   April 02, 2011. The right atrial pressure had a mean of 25, RV pressure is 117/13, PA pressure was 112/54 with mean of 74. Cardiac output was 5.8 liters. Cardiac index of 3.3 liters. Her pulmonary vascular resistance calculated to approximately 13 Woods units. unction with an EF of 55-60% with moderate decrease in her RV Function.   ECHO 06/2011 EF 60-65%. RV moderately dilated and mild to moderately hypokinetic with septal flattening RVSP 77  (07/12/11) Repeat 6MW at pulmonary rehab unchanged from August 2012. 900 m.  Discharged from pulm rehab.  But has a free 1 month of rehab still to use.   09/30/11 RHC RA = 10  RV = 103/14/14  PA =101/44 (68)  PCW = 19  Fick cardiac output/index = 4.7/2.4  PVR = 10.3 Woods FA sat = 95% (2L)  PA sat = 68%, 70%  04/26/12 6 MW 870 feet O2 sats 93-94% on 2 liters Alexis [name].  She returns for follow up today with her husband. . She is not currently taking Ventavis due to gum pain (stopped taking 04/2012). Tooth extractions scheduled  05/11/12. She plans Ventavis after dental surgery.  Exertional dyspnea with ADLs which is ongoing. Denies dizziness/CP. Remains on 2 liters Alexis [name]. Weight at home  230-235 pounds. She has not required any extra lasix or metolazone. Denies lower extremity edema.      Allergies  Allergen Reactions  . Bactrim     Face and eye swelling  . Morphine And Related Other (See Comments)    Severe headache  . Penicillins     "doesn't work" got a lot as a child that doesn't work. But no systemic reaction.  . Sulfa Antibiotics     Current Outpatient Prescriptions on File Prior to Encounter  Medication Sig Dispense Refill  . albuterol (PROVENTIL HFA;VENTOLIN HFA) 108 (90 BASE) MCG/ACT inhaler Inhale 2 puffs into the lungs every 2 (two) hours as needed.  1 Inhaler  1  . albuterol-ipratropium (COMBIVENT) 18-103 MCG/ACT inhaler Inhale 2 puffs into the lungs every 6 (six) hours as needed.  14.7 g  1  . aspirin 81 MG EC tablet Take 81 mg by mouth daily.        . fluticasone (FLOVENT HFA) 220 MCG/ACT inhaler Inhale 1 puff into the lungs 2 (two) times daily.  1 Inhaler  1  . furosemide (LASIX) 40 MG tablet 2 tablets in the AM, 1 tablet in the PM; May take extra tab if needed for weight gain      . Iloprost (VENTAVIS) 10 MCG/ML SOLN Five to Six times a day      . metolazone (ZAROXOLYN) 2.5 MG tablet Take 1 tablet (2.5 mg total) by mouth every Monday.  6 tablet  3  . NON FORMULARY Place 2 L/min into the nose  as needed. Oxygen      . omeprazole (PRILOSEC) 20 MG capsule Take 1 capsule (20 mg total) by mouth daily.  30 capsule  6  . potassium chloride SA (K-DUR,KLOR-CON) 20 MEQ tablet Take 20 mEq by mouth daily.      Marland Kitchen spironolactone (ALDACTONE) 25 MG tablet Take 0.5 tablets (12.5 mg total) by mouth daily.  30 tablet  3  . Tadalafil, PAH, (ADCIRCA) 20 MG TABS Take 2 tablets (40 mg total) by mouth daily.  60 tablet  3  . traMADol (ULTRAM) 50 MG tablet Take 50 mg by mouth every 6 (six) hours as needed.      . warfarin (COUMADIN) 5 MG tablet         Past Medical History  Diagnosis Date  . SOB (shortness of breath) on exertion   . Other chronic pulmonary heart diseases     . Chronic obstructive asthma with exacerbation     CT scan was a pattern but no evidence of pulmonary embolus and., Negative VQ scan July 2012 pulmonary function test FVC 1.17 L per minute 37% of predicted, FEV1 1 L 40% of predicted. DLCO 61% of predicted no response to bronchodilators negative HIV serology, ANA in scleroderma.  . Tobacco use disorder   . Obesity, unspecified   . Other and unspecified hyperlipidemia   . Cardiomegaly     Normal LV systolic function ejection fraction 55-60% with moderate decrease in RV function.  . Other diseases of lung, not elsewhere classified   . Hepatomegaly   . Body mass index 40.0-44.9, adult   . Edema   . Pulmonary arterial hypertension      PA pressure 112/54 mmHg the mean of 74 mmHg, right atrial pressure mean 25 mmHg. Pulmonary vascular resistance 13 with units. Cardiac output 5.8 L. Normal coronary arteries with normal LV function July 2012   . Obstructive sleep apnea     BiPAP initiated July 2012    DXA:JOINOMVEH positives as outlined above. The remainder of the 18  point review of systems is negative  PHYSICAL EXAM Filed Vitals:   04/27/12 0951  BP: 104/70  Pulse: 114  Height: _0  (1.575 m)  Weight: 232 lb (105.235 kg)  SpO2: 94%   General: Well appearing. No respiratory difficulty, on home O2. (husband present) HEENT: normal  Neck: supple. Thick. Hard to assess JVP. Carotids 2+ bilat; no bruits. No lymphadenopathy or thryomegaly appreciated.  Cor: PMI nonpalpable. Distant. Regular rate and rhythm/ rubs, gallops or murmurs. Lungs: diminished in bases 2 liters [place] Abdomen: obese, nondistended. nontender No hepatosplenomegaly. No bruits or masses. Good bowel sounds.  Extremities: no cyanosis, clubbing, rash, trace edema.   Neuro: alert & oriented x 3, cranial nerves grossly intact. moves all 4 extremities w/o difficulty. Affect pleasant.   ASSESSMENT AND PLAN

## 2012-04-27 NOTE — Patient Instructions (Addendum)
Follow up in 2 months with Dr Haroldine Laws  Do the following things EVERYDAY: 1) Weigh yourself in the morning before breakfast. Write it down and keep it in a log. 2) Take your medicines as prescribed 3) Eat low salt foods-Limit salt (sodium) to 2000 mg per day.  4) Stay as active as you can everyday 5) Limit all fluids for the day to less than 2 liters

## 2012-04-27 NOTE — Progress Notes (Signed)
6 min walk test completed, pt walked 870 ft, O2 stayed between 94-93% on 2 L O2

## 2012-04-27 NOTE — Assessment & Plan Note (Addendum)
Continue current regimen. Restart  ventavis after gum surgery. Follow up in 2 months with Dr Haroldine Laws.   Patient seen and examined with Darrick Grinder, NP. We discussed all aspects of the encounter. I agree with the assessment and plan as stated above. Functional class much improved but still with evidence of significant PAH. Continue PDE-5 inhibition. Resume Ventavis once gum surgery complete. If cannot tolerate can consider ERA but given the severity of her lung disease I favor an inhaled agent. Check echo.

## 2012-05-11 ENCOUNTER — Other Ambulatory Visit: Payer: Self-pay | Admitting: Cardiology

## 2012-05-23 ENCOUNTER — Ambulatory Visit (INDEPENDENT_AMBULATORY_CARE_PROVIDER_SITE_OTHER): Payer: Self-pay | Admitting: *Deleted

## 2012-05-23 DIAGNOSIS — Z7901 Long term (current) use of anticoagulants: Secondary | ICD-10-CM

## 2012-05-23 DIAGNOSIS — I2789 Other specified pulmonary heart diseases: Secondary | ICD-10-CM

## 2012-05-23 DIAGNOSIS — I272 Pulmonary hypertension, unspecified: Secondary | ICD-10-CM

## 2012-06-23 ENCOUNTER — Ambulatory Visit (INDEPENDENT_AMBULATORY_CARE_PROVIDER_SITE_OTHER): Payer: Self-pay | Admitting: *Deleted

## 2012-06-23 DIAGNOSIS — Z7901 Long term (current) use of anticoagulants: Secondary | ICD-10-CM

## 2012-06-23 DIAGNOSIS — I272 Pulmonary hypertension, unspecified: Secondary | ICD-10-CM

## 2012-06-23 DIAGNOSIS — I2789 Other specified pulmonary heart diseases: Secondary | ICD-10-CM

## 2012-07-03 ENCOUNTER — Ambulatory Visit (HOSPITAL_COMMUNITY)
Admission: RE | Admit: 2012-07-03 | Discharge: 2012-07-03 | Disposition: A | Discharge status: Home / Self Care | Payer: Self-pay | Source: Ambulatory Visit | Attending: Internal Medicine | Admitting: Internal Medicine

## 2012-07-03 VITALS — BP 116/66 | HR 115 | Wt 237.1 lb

## 2012-07-03 DIAGNOSIS — I2721 Secondary pulmonary arterial hypertension: Secondary | ICD-10-CM

## 2012-07-03 DIAGNOSIS — I2789 Other specified pulmonary heart diseases: Secondary | ICD-10-CM | POA: Insufficient documentation

## 2012-07-03 NOTE — Assessment & Plan Note (Addendum)
Unable to tolerate Ventavis. Would like to start tracleer however cost is a major issue. Follow up in 1 month with an ECHO.   Patient seen and examined with Darrick Grinder, NP. We discussed all aspects of the encounter. I agree with the assessment and plan as stated above. She has severe PAH out of proportion to her lung disease. She is doing well on Adcirca but unable to tolerate Ventavis. Will start ERA. Given financial situation will need company assistance program. Will need repeat echo.

## 2012-07-03 NOTE — Progress Notes (Signed)
Patient ID: Alexis [name], female   DOB: 11-Aug-1956, 56 y.o.   MRN: [id number] HPI Alexis [name] is a 56 year old female with history of PAH and diastolic dysfunction.   Initial presentation was to [facility] with acute on chronic respiratory failure. Based on the CT scan it was felt that the patient have severe pulmonary hypertension and was referred for diagnostic cardiac catheterization. Her PA pressures and mean pulmonary artery pressure were markedly elevated requiring initiation of oxygen therapy, anticoagulation and oral tadalafil. She had no significant coronary artery disease. She was found to have severe obstructive/restrictive lung disease but with very little response to bronchodilator therapy. She was then subsequently referred to Dr. Koleen Nimrod for followup.   April 02, 2011. The right atrial pressure had a mean of 25, RV pressure is 117/13, PA pressure was 112/54 with mean of 74. Cardiac output was 5.8 liters. Cardiac index of 3.3 liters. Her pulmonary vascular resistance calculated to approximately 13 Woods units. unction with an EF of 55-60% with moderate decrease in her RV Function.   ECHO 06/2011 EF 60-65%. RV moderately dilated and mild to moderately hypokinetic with septal flattening RVSP 77  (07/12/11) Repeat 6MW at pulmonary rehab unchanged from August 2012. 900 m.  Discharged from pulm rehab.  But has a free 1 month of rehab still to use.   09/30/11 RHC RA = 10  RV = 103/14/14  PA =101/44 (68)  PCW = 19  Fick cardiac output/index = 4.7/2.4  PVR = 10.3 Woods FA sat = 95% (2L)  PA sat = 68%, 70%  04/26/12 6 MW 870 feet O2 sats 93-94% on 2 liters [place].  She returns for follow up today with her husband. Complains of cough and drainage in her throat. +Orthopnea She does have disability.  Medicaid denied and no longer receiving food stamps. Only weighing 4 times a month. Not wearing oxygen today due to had congestion. Continues to use 2 liters [place]. Once in a while.   Uses rolling walker. She has had Metolazone once over the last month. Tried Ventavis but stopped due to recurrent cough. She is able to pay for her medications but it is a struggle.    Allergies  Allergen Reactions  . Bactrim     Face and eye swelling  . Morphine And Related Other (See Comments)    Severe headache  . Penicillins     "doesn't work" got a lot as a child that doesn't work. But no systemic reaction.  . Sulfa Antibiotics     Current Outpatient Prescriptions on File Prior to Encounter  Medication Sig Dispense Refill  . albuterol (PROVENTIL HFA;VENTOLIN HFA) 108 (90 BASE) MCG/ACT inhaler Inhale 2 puffs into the lungs every 2 (two) hours as needed.  1 Inhaler  1  . albuterol-ipratropium (COMBIVENT) 18-103 MCG/ACT inhaler Inhale 2 puffs into the lungs every 6 (six) hours as needed.  14.7 g  1  . aspirin 81 MG EC tablet Take 81 mg by mouth daily.        . fluticasone (FLOVENT HFA) 220 MCG/ACT inhaler Inhale 1 puff into the lungs 2 (two) times daily.  1 Inhaler  1  . furosemide (LASIX) 40 MG tablet 2 tablets in the AM, 1 tablet in the PM; May take extra tab if needed for weight gain      . metolazone (ZAROXOLYN) 2.5 MG tablet Take 1 tablet (2.5 mg total) by mouth every Monday.  6 tablet  3  . NON FORMULARY Place 2 L/min  into the nose as needed. Oxygen      . omeprazole (PRILOSEC) 20 MG capsule Take 1 capsule (20 mg total) by mouth daily.  30 capsule  6  . potassium chloride SA (K-DUR,KLOR-CON) 20 MEQ tablet Take 20 mEq by mouth daily.      Marland Kitchen spironolactone (ALDACTONE) 25 MG tablet Take 0.5 tablets (12.5 mg total) by mouth daily.  30 tablet  3  . Tadalafil, PAH, (ADCIRCA) 20 MG TABS Take 2 tablets (40 mg total) by mouth daily.  60 tablet  3  . traMADol (ULTRAM) 50 MG tablet Take 50 mg by mouth every 6 (six) hours as needed.      . warfarin (COUMADIN) 5 MG tablet as needed.       . warfarin (COUMADIN) 5 MG tablet TAKE ONE & ONE-HALF TABLETS BY MOUTH EVERY DAY EXCEPT SUNDAYS AND  THURSDAYS, TAKE ONE EVERY DAY ON THOSE DAYS.  45 tablet  3    Past Medical History  Diagnosis Date  . SOB (shortness of breath) on exertion   . Other chronic pulmonary heart diseases   . Chronic obstructive asthma with exacerbation     CT scan was a pattern but no evidence of pulmonary embolus and., Negative VQ scan July 2012 pulmonary function test FVC 1.17 L per minute 37% of predicted, FEV1 1 L 40% of predicted. DLCO 61% of predicted no response to bronchodilators negative HIV serology, ANA in scleroderma.  . Tobacco use disorder   . Obesity, unspecified   . Other and unspecified hyperlipidemia   . Cardiomegaly     Normal LV systolic function ejection fraction 55-60% with moderate decrease in RV function.  . Other diseases of lung, not elsewhere classified   . Hepatomegaly   . Body mass index 40.0-44.9, adult   . Edema   . Pulmonary arterial hypertension      PA pressure 112/54 mmHg the mean of 74 mmHg, right atrial pressure mean 25 mmHg. Pulmonary vascular resistance 13 with units. Cardiac output 5.8 L. Normal coronary arteries with normal LV function July 2012   . Obstructive sleep apnea     BiPAP initiated July 2012    [id number] positives as outlined above. The remainder of the 18  point review of systems is negative  PHYSICAL EXAM Filed Vitals:   07/03/12 1101  BP: 116/66  Pulse: 115  Weight: 237 lb 1.9 oz (107.557 kg)  SpO2: 90%   General: Well appearing. No respiratory difficulty, on home O2. (husband present) HEENT: normal  Neck: supple. Thick. Hard to assess due to body habitus but JVP does not appear elelvated. Carotids 2+ bilat; no bruits. No lymphadenopathy or thryomegaly appreciated.  Cor: PMI nonpalpable. Distant. Regular rate and rhythm/ rubs, gallops or murmurs. Lungs: diminished in bases 2 liters [place] Abdomen: obese, nondistended. nontender No hepatosplenomegaly. No bruits or masses. Good bowel sounds.  Extremities: no cyanosis, clubbing, rash, trace  edema.   Neuro: alert & oriented x 3, cranial nerves grossly intact. moves all 4 extremities w/o difficulty. Affect pleasant.   ASSESSMENT AND PLAN

## 2012-07-03 NOTE — Assessment & Plan Note (Signed)
Volume status stable. Reinforced daily weights and medication compliance.

## 2012-07-03 NOTE — Patient Instructions (Addendum)
Follow up in 1 month with ECHO and Dr Vaughan Browner  Do the following things EVERYDAY: 1) Weigh yourself in the morning before breakfast. Write it down and keep it in a log. 2) Take your medicines as prescribed 3) Eat low salt foods-Limit salt (sodium) to 2000 mg per day.  4) Stay as active as you can everyday 5) Limit all fluids for the day to less than 2 liters

## 2012-08-10 ENCOUNTER — Ambulatory Visit (HOSPITAL_COMMUNITY): Payer: Self-pay

## 2012-09-11 ENCOUNTER — Ambulatory Visit (HOSPITAL_COMMUNITY): Payer: Self-pay

## 2012-10-02 ENCOUNTER — Ambulatory Visit (HOSPITAL_COMMUNITY)
Admission: RE | Admit: 2012-10-02 | Discharge: 2012-10-02 | Disposition: A | Discharge status: Home / Self Care | Payer: Self-pay | Source: Ambulatory Visit | Attending: Family Medicine | Admitting: Family Medicine

## 2012-10-02 ENCOUNTER — Ambulatory Visit (INDEPENDENT_AMBULATORY_CARE_PROVIDER_SITE_OTHER): Payer: Medicaid - Out of State | Admitting: *Deleted

## 2012-10-02 ENCOUNTER — Encounter (HOSPITAL_COMMUNITY): Payer: Self-pay | Admitting: *Deleted

## 2012-10-02 ENCOUNTER — Encounter (HOSPITAL_COMMUNITY): Payer: Self-pay

## 2012-10-02 ENCOUNTER — Ambulatory Visit (HOSPITAL_BASED_OUTPATIENT_CLINIC_OR_DEPARTMENT_OTHER)
Admission: RE | Admit: 2012-10-02 | Discharge: 2012-10-02 | Disposition: A | Discharge status: Home / Self Care | Payer: Self-pay | Source: Ambulatory Visit | Attending: Internal Medicine | Admitting: Internal Medicine

## 2012-10-02 VITALS — BP 122/72 | HR 116 | Wt 242.4 lb

## 2012-10-02 DIAGNOSIS — I2721 Secondary pulmonary arterial hypertension: Secondary | ICD-10-CM

## 2012-10-02 DIAGNOSIS — I2789 Other specified pulmonary heart diseases: Secondary | ICD-10-CM

## 2012-10-02 DIAGNOSIS — Z7901 Long term (current) use of anticoagulants: Secondary | ICD-10-CM

## 2012-10-02 DIAGNOSIS — R0609 Other forms of dyspnea: Secondary | ICD-10-CM | POA: Insufficient documentation

## 2012-10-02 DIAGNOSIS — J4489 Other specified chronic obstructive pulmonary disease: Secondary | ICD-10-CM | POA: Insufficient documentation

## 2012-10-02 DIAGNOSIS — J449 Chronic obstructive pulmonary disease, unspecified: Secondary | ICD-10-CM | POA: Insufficient documentation

## 2012-10-02 DIAGNOSIS — I5032 Chronic diastolic (congestive) heart failure: Secondary | ICD-10-CM

## 2012-10-02 DIAGNOSIS — E785 Hyperlipidemia, unspecified: Secondary | ICD-10-CM | POA: Insufficient documentation

## 2012-10-02 DIAGNOSIS — R0989 Other specified symptoms and signs involving the circulatory and respiratory systems: Secondary | ICD-10-CM | POA: Insufficient documentation

## 2012-10-02 DIAGNOSIS — F17200 Nicotine dependence, unspecified, uncomplicated: Secondary | ICD-10-CM | POA: Insufficient documentation

## 2012-10-02 LAB — COMPREHENSIVE METABOLIC PANEL
ALT: 38 U/L — ABNORMAL HIGH (ref 0–35)
Alkaline Phosphatase: 162 U/L — ABNORMAL HIGH (ref 39–117)
CO2: 34 mEq/L — ABNORMAL HIGH (ref 19–32)
GFR calc Af Amer: 67 mL/min — ABNORMAL LOW (ref >90)
GFR calc non Af Amer: 58 mL/min — ABNORMAL LOW (ref >90)
Glucose, Bld: 105 mg/dL — ABNORMAL HIGH (ref 70–99)
Potassium: 3 mEq/L — ABNORMAL LOW (ref 3.5–5.1)
Sodium: 131 mEq/L — ABNORMAL LOW (ref 135–145)

## 2012-10-02 LAB — CBC
MCH: 30.6 pg (ref 26.0–34.0)
MCHC: 34 g/dL (ref 30.0–36.0)
MCV: 90 fL (ref 78.0–100.0)
Platelets: 286 10*3/uL (ref 150–400)
RDW: 14.3 % (ref 11.5–15.5)

## 2012-10-02 NOTE — Progress Notes (Signed)
  Echocardiogram 2D Echocardiogram has been performed.  Alexis [name] 10/02/2012, 1:49 PM

## 2012-10-02 NOTE — Assessment & Plan Note (Addendum)
Functionally she continues to have dyspnea with exertion despite Adcirca and Macitentan. Dr Haroldine Laws reviewed and discussed ECHO results. Repeat RHC to reassess hemodynamics. Will need repeat 6MW. Check CMET today. Follow up in 1 month.   Patient seen and examined with Darrick Grinder, NP. We discussed all aspects of the encounter. I agree with the assessment and plan as stated above.  Clinically not much improvement with addition of macitentan. However, I reviewed echo today and RV does look better. No TR picked up to re-evaluate PA pressures. Given severity of PAH on last cath, I think it is important to repeat 6MW and RHC to objectively assess response. If PA pressures and 6MW without improvement can consider stopping macitentan. O2 sats are preserved so doubt significant shunting with therapy.

## 2012-10-02 NOTE — Patient Instructions (Addendum)
Follow up in 1 month   Do the following things EVERYDAY: 1) Weigh yourself in the morning before breakfast. Write it down and keep it in a log. 2) Take your medicines as prescribed 3) Eat low salt foods-Limit salt (sodium) to 2000 mg per day.  4) Stay as active as you can everyday 5) Limit all fluids for the day to less than 2 liters

## 2012-10-02 NOTE — Progress Notes (Signed)
6 min walk test completed, pt ambulated 830 ft on 2L O2 via [place], pt did have to stop twice for 5-10 sec rest breaks, O2 sat ranged from 87-90%

## 2012-10-02 NOTE — Progress Notes (Signed)
Patient ID: Alexis [name], female   DOB: [birth year], 57 y.o.   MRN: [id number] PCP: Dr Frann Rider [place] [phone] HPI Mrs Plant is a 57 year old female with history of PAH and diastolic dysfunction. Intolerant Ventavis due to recurrent cough.  Initial presentation was to [facility] with acute on chronic respiratory failure. Based on the CT scan it was felt that the patient have severe pulmonary hypertension and was referred for diagnostic cardiac catheterization. Her PA pressures and mean pulmonary artery pressure were markedly elevated requiring initiation of oxygen therapy, anticoagulation and oral tadalafil. She had no significant coronary artery disease. She was found to have severe obstructive/restrictive lung disease but with very little response to bronchodilator therapy. She was then subsequently referred to Dr. Koleen Nimrod for follow p.   April 02, 2011. The right atrial pressure had a mean of 25, RV pressure is 117/13, PA pressure was 112/54 with mean of 74. Cardiac output was 5.8 liters. Cardiac index of 3.3 liters. Her pulmonary vascular resistance calculated to approximately 13 Woods units. unction with an EF of 55-60% with moderate decrease in her RV Function.   ECHO 06/2011 EF 60-65%. RV moderately dilated and mild to moderately hypokinetic with septal flattening RVSP 77 ECHO 10/02/12 Poor quality. EF 60-65% RV does appear better.   07/12/11 6MW at pulmonary rehab unchanged from August 2012. 900 feet. 04/26/12  6MW 870 feet O2 sats 93-94% on 2 liters [place].  09/30/11 RHC RA = 10  RV = 103/14/14  PA =101/44 (68)  PCW = 19  Fick cardiac output/index = 4.7/2.4  PVR = 10.3 Woods FA sat = 95% (2L)  PA sat = 68%, 70%  Evaluated [place] in December 2014 for LUQ pain. Treated for UTI.   She returns for follow up today with her husband. Continues on Adcirca 40 mg daily and Macitentan 10 mg daily. O 2 sats at home 87-92%. Continues to complain of exertional  dyspnea with ADLs. Now using motorized cart in th grocery store. +Orthopnea.  Required Metolazone on Saturday and Sunday due to edema. Weight at home 248-250 pounds. She continue on 2 liters [place]. Compliant with medications. Tries to drink < 2 liters per day. She has been eating lots corn chips. Continues to ambulate with walker. She has been unable to finish pulmonary rehab due to transportation. Coumadin followed by Velora Heckler at [place].      Allergies  Allergen Reactions  . Bactrim     Face and eye swelling  . Morphine And Related Other (See Comments)    Severe headache  . Penicillins     "doesn't work" got a lot as a child that doesn't work. But no systemic reaction.  . Sulfa Antibiotics     Current Outpatient Prescriptions on File Prior to Encounter  Medication Sig Dispense Refill  . albuterol (PROVENTIL HFA;VENTOLIN HFA) 108 (90 BASE) MCG/ACT inhaler Inhale 2 puffs into the lungs every 2 (two) hours as needed.  1 Inhaler  1  . albuterol-ipratropium (COMBIVENT) 18-103 MCG/ACT inhaler Inhale 2 puffs into the lungs every 6 (six) hours as needed.  14.7 g  1  . aspirin 81 MG EC tablet Take 81 mg by mouth daily.        . fluticasone (FLOVENT HFA) 220 MCG/ACT inhaler Inhale 1 puff into the lungs 2 (two) times daily.  1 Inhaler  1  . furosemide (LASIX) 40 MG tablet 2 tablets in the AM, 1 tablet in the PM; May take extra tab if needed for weight  gain      . metolazone (ZAROXOLYN) 2.5 MG tablet Take 1 tablet (2.5 mg total) by mouth every Monday.  6 tablet  3  . NON FORMULARY Place 2 L/min into the nose as needed. Oxygen      . omeprazole (PRILOSEC) 20 MG capsule Take 1 capsule (20 mg total) by mouth daily.  30 capsule  6  . potassium chloride SA (K-DUR,KLOR-CON) 20 MEQ tablet Take 20 mEq by mouth daily.      Marland Kitchen spironolactone (ALDACTONE) 25 MG tablet Take 0.5 tablets (12.5 mg total) by mouth daily.  30 tablet  3  . Tadalafil, PAH, (ADCIRCA) 20 MG TABS Take 2 tablets (40 mg total) by mouth daily.   60 tablet  3  . traMADol (ULTRAM) 50 MG tablet Take 50 mg by mouth every 6 (six) hours as needed.      . warfarin (COUMADIN) 5 MG tablet TAKE ONE & ONE-HALF TABLETS BY MOUTH EVERY DAY EXCEPT SUNDAYS AND THURSDAYS, TAKE ONE EVERY DAY ON THOSE DAYS.  45 tablet  3    Past Medical History  Diagnosis Date  . SOB (shortness of breath) on exertion   . Other chronic pulmonary heart diseases   . Chronic obstructive asthma with exacerbation     CT scan was a pattern but no evidence of pulmonary embolus and., Negative VQ scan July 2012 pulmonary function test FVC 1.17 L per minute 37% of predicted, FEV1 1 L 40% of predicted. DLCO 61% of predicted no response to bronchodilators negative HIV serology, ANA in scleroderma.  . Tobacco use disorder   . Obesity, unspecified   . Other and unspecified hyperlipidemia   . Cardiomegaly     Normal LV systolic function ejection fraction 55-60% with moderate decrease in RV function.  . Other diseases of lung, not elsewhere classified   . Hepatomegaly   . Body mass index 40.0-44.9, adult   . Edema   . Pulmonary arterial hypertension      PA pressure 112/54 mmHg the mean of 74 mmHg, right atrial pressure mean 25 mmHg. Pulmonary vascular resistance 13 with units. Cardiac output 5.8 L. Normal coronary arteries with normal LV function July 2012   . Obstructive sleep apnea     BiPAP initiated July 2012    [id number] positives as outlined above. The remainder of the 18  point review of systems is negative  PHYSICAL EXAM Filed Vitals:   10/02/12 1351  BP: 122/72  Pulse: 116  Weight: 242 lb 6.4 oz (109.952 kg)  SpO2: 92%   General: Well appearing. No respiratory difficulty, on home O2. (husband present) HEENT: normal  Neck: supple. Thick. Hard to assess due to body habitus but JVP does not appear elelvated. Carotids 2+ bilat; no bruits. No lymphadenopathy or thryomegaly appreciated.  Cor: PMI nonpalpable. Distant. Regular rate and rhythm/ rubs, gallops or  murmurs. Lungs: diminished in bases 2 liters [place] Abdomen: obese, nondistended. nontender No hepatosplenomegaly. No bruits or masses. Good bowel sounds.  Extremities: no cyanosis, clubbing, rash, trace edema.   Neuro: alert & oriented x 3, cranial nerves grossly intact. moves all 4 extremities w/o difficulty. Affect pleasant.   ASSESSMENT AND PLAN

## 2012-10-02 NOTE — Assessment & Plan Note (Signed)
Check PT/INR today. Will send results to [facility].

## 2012-10-02 NOTE — Assessment & Plan Note (Addendum)
Volume status stable. Continue current diuretic regimen. Check renal function today.

## 2012-10-03 ENCOUNTER — Encounter (HOSPITAL_COMMUNITY): Payer: Self-pay | Admitting: Pharmacy Technician

## 2012-10-03 ENCOUNTER — Other Ambulatory Visit (HOSPITAL_COMMUNITY): Payer: Self-pay | Admitting: Physician Assistant

## 2012-10-03 DIAGNOSIS — I272 Pulmonary hypertension, unspecified: Secondary | ICD-10-CM

## 2012-10-06 ENCOUNTER — Encounter (HOSPITAL_COMMUNITY)
Admission: RE | Disposition: A | Discharge status: Home / Self Care | Payer: Self-pay | Source: Ambulatory Visit | Attending: Internal Medicine

## 2012-10-06 ENCOUNTER — Ambulatory Visit (HOSPITAL_COMMUNITY)
Admission: RE | Admit: 2012-10-06 | Discharge: 2012-10-06 | Disposition: A | Discharge status: Home / Self Care | Payer: Self-pay | Source: Ambulatory Visit | Attending: Internal Medicine | Admitting: Internal Medicine

## 2012-10-06 DIAGNOSIS — F17200 Nicotine dependence, unspecified, uncomplicated: Secondary | ICD-10-CM | POA: Insufficient documentation

## 2012-10-06 DIAGNOSIS — I2789 Other specified pulmonary heart diseases: Secondary | ICD-10-CM | POA: Insufficient documentation

## 2012-10-06 DIAGNOSIS — J4489 Other specified chronic obstructive pulmonary disease: Secondary | ICD-10-CM | POA: Insufficient documentation

## 2012-10-06 DIAGNOSIS — E785 Hyperlipidemia, unspecified: Secondary | ICD-10-CM | POA: Insufficient documentation

## 2012-10-06 DIAGNOSIS — E669 Obesity, unspecified: Secondary | ICD-10-CM | POA: Insufficient documentation

## 2012-10-06 DIAGNOSIS — I272 Pulmonary hypertension, unspecified: Secondary | ICD-10-CM

## 2012-10-06 DIAGNOSIS — I517 Cardiomegaly: Secondary | ICD-10-CM | POA: Insufficient documentation

## 2012-10-06 DIAGNOSIS — Z6841 Body Mass Index (BMI) 40.0 and over, adult: Secondary | ICD-10-CM | POA: Insufficient documentation

## 2012-10-06 DIAGNOSIS — J449 Chronic obstructive pulmonary disease, unspecified: Secondary | ICD-10-CM | POA: Insufficient documentation

## 2012-10-06 DIAGNOSIS — G4733 Obstructive sleep apnea (adult) (pediatric): Secondary | ICD-10-CM | POA: Insufficient documentation

## 2012-10-06 DIAGNOSIS — Z7901 Long term (current) use of anticoagulants: Secondary | ICD-10-CM | POA: Insufficient documentation

## 2012-10-06 DIAGNOSIS — I279 Pulmonary heart disease, unspecified: Secondary | ICD-10-CM

## 2012-10-06 HISTORY — PX: RIGHT HEART CATHETERIZATION: SHX5447

## 2012-10-06 LAB — POCT I-STAT 3, VENOUS BLOOD GAS (G3P V)
Acid-Base Excess: 8 mmol/L — ABNORMAL HIGH (ref 0.0–2.0)
Acid-Base Excess: 9 mmol/L — ABNORMAL HIGH (ref 0.0–2.0)
O2 Saturation: 71 %
O2 Saturation: 73 %
TCO2: 36 mmol/L (ref 0–100)
pCO2, Ven: 54.6 mmHg — ABNORMAL HIGH (ref 45.0–50.0)
pCO2, Ven: 54.7 mmHg — ABNORMAL HIGH (ref 45.0–50.0)
pH, Ven: 7.398 — ABNORMAL HIGH (ref 7.250–7.300)
pH, Ven: 7.414 — ABNORMAL HIGH (ref 7.250–7.300)
pO2, Ven: 38 mmHg (ref 30.0–45.0)

## 2012-10-06 LAB — POCT I-STAT 3, ART BLOOD GAS (G3+)
Acid-Base Excess: 6 mmol/L — ABNORMAL HIGH (ref 0.0–2.0)
O2 Saturation: 95 %
TCO2: 33 mmol/L (ref 0–100)
pCO2 arterial: 49.4 mmHg — ABNORMAL HIGH (ref 35.0–45.0)
pO2, Arterial: 77 mmHg — ABNORMAL LOW (ref 80.0–100.0)

## 2012-10-06 LAB — BASIC METABOLIC PANEL
Calcium: 9.3 mg/dL (ref 8.4–10.5)
GFR calc Af Amer: >90 mL/min (ref >90)
GFR calc non Af Amer: >90 mL/min (ref >90)
Potassium: 3.7 mEq/L (ref 3.5–5.1)
Sodium: 138 mEq/L (ref 135–145)

## 2012-10-06 SURGERY — RIGHT HEART CATH
Anesthesia: LOCAL

## 2012-10-06 MED ORDER — SODIUM CHLORIDE 0.9 % IV SOLN: 250.0000 mL | INTRAVENOUS | Status: DC | PRN

## 2012-10-06 MED ORDER — MIDAZOLAM HCL 2 MG/2ML IJ SOLN
INTRAMUSCULAR | Status: AC
  Filled 2012-10-06: qty 2

## 2012-10-06 MED ORDER — SODIUM CHLORIDE 0.9 % IJ SOLN: 3.0000 mL | Freq: Two times a day (BID) | INTRAMUSCULAR | Status: DC

## 2012-10-06 MED ORDER — LIDOCAINE HCL (PF) 1 % IJ SOLN
INTRAMUSCULAR | Status: AC
  Filled 2012-10-06: qty 30

## 2012-10-06 MED ORDER — HEPARIN (PORCINE) IN NACL 2-0.9 UNIT/ML-% IJ SOLN
INTRAMUSCULAR | Status: AC
  Filled 2012-10-06: qty 500

## 2012-10-06 MED ORDER — SODIUM CHLORIDE 0.9 % IJ SOLN: 3.0000 mL | INTRAMUSCULAR | Status: DC | PRN

## 2012-10-06 MED ORDER — FENTANYL CITRATE 0.05 MG/ML IJ SOLN
INTRAMUSCULAR | Status: AC
  Filled 2012-10-06: qty 2

## 2012-10-06 MED ORDER — ONDANSETRON HCL 4 MG/2ML IJ SOLN: 4.0000 mg | Freq: Four times a day (QID) | INTRAMUSCULAR | Status: DC | PRN

## 2012-10-06 MED ORDER — ACETAMINOPHEN 325 MG PO TABS: 650.0000 mg | ORAL_TABLET | ORAL | Status: DC | PRN

## 2012-10-06 NOTE — H&P (View-Only) (Signed)
Patient ID: Alexis [name], female   DOB: 11/06/1955, 56 y.o.   MRN: [id number] PCP: Alex[facility] [phone] HPI Alexis [name] is a 56-year-old female with history of PAH and diastolic dysfunction. Intolerant Ventavis due to recurrent cough.  [name]. Based on the CT scan it was felt that the patient have severe pulmonary hypertension and was referred for diagnostic cardiac catheterization. Her PA pressures and mean pulmonary artery pressure were markedly elevated requiring initiation of oxygen therapy, anticoagulation and oral tadalafil. She had no significant coronary artery disease. She was found to have severe obstructive/restrictive lung disease but with very little response to bronchodilator therapy. She was then subsequently referred to [facility] for follow p.   April 02, 2011. The right atrial pressure had a mean of 25, RV pressure is 117/13, PA pressure was 112/54 with mean of 74. Cardiac output was 5.8 liters. Cardiac index of 3.3 liters. Her pulmonary vascular resistance calculated to approximately 13 Woods units. unction with an EF of 55-60% with moderate decrease in her RV Function.   ECHO 06/2011 EF 60-65%. RV moderately dilated and mild to moderately hypokinetic with septal flattening RVSP 77 ECHO 10/02/12 Poor quality. EF 60-65% RV does appear better.   07/12/11 6MW at pulmonary rehab unchanged from August 2012. 900 feet. 04/26/12  6MW 870 feet O2 sats 93-94% on 2 liters [place].  09/30/11 RHC RA = 10  RV = 103/14/14  PA =101/44 (68)  PCW = 19  Fick cardiac output/index = 4.7/2.4  PVR = 10.3 Woods FA sat = 95% (2L)  PA sat = 68%, 70%  Evaluated Alexis [name] in December 2014 for LUQ pain. Treated for UTI.   She returns for follow up today with her husband. Continues on Adcirca 40 mg daily and Macitentan 10 mg daily. O 2 sats at home 87-92%. Continues to complain of exertional  dyspnea with ADLs. Now using motorized cart in th grocery store. +Orthopnea.  Required Metolazone on Saturday and Sunday due to edema. Weight at home 248-250 pounds. She continue on 2 liters [place]. Compliant with medications. Tries to drink < 2 liters per day. She has been eating lots corn chips. Continues to ambulate with walker. She has been unable to finish pulmonary rehab due to transportation. Coumadin followed by Alexis [name].      Allergies  Allergen Reactions  . Bactrim     Face and eye swelling  . Morphine And Related Other (See Comments)    Severe headache  . Penicillins     "doesn't work" got a lot as a child that doesn't work. But no systemic reaction.  . Sulfa Antibiotics     Current Outpatient Prescriptions on File Prior to Encounter  Medication Sig Dispense Refill  . albuterol (PROVENTIL HFA;VENTOLIN HFA) 108 (90 BASE) MCG/ACT inhaler Inhale 2 puffs into the lungs every 2 (two) hours as needed.  1 Inhaler  1  . albuterol-ipratropium (COMBIVENT) 18-103 MCG/ACT inhaler Inhale 2 puffs into the lungs every 6 (six) hours as needed.  14.7 g  1  . aspirin 81 MG EC tablet Take 81 mg by mouth daily.        . fluticasone (FLOVENT HFA) 220 MCG/ACT inhaler Inhale 1 puff into the lungs 2 (two) times daily.  1 Inhaler  1  . furosemide (LASIX) 40 MG tablet 2 tablets in the AM, 1 tablet in the PM; May take extra tab if needed for weight   gain      . metolazone (ZAROXOLYN) 2.5 MG tablet Take 1 tablet (2.5 mg total) by mouth every Monday.  6 tablet  3  . NON FORMULARY Place 2 L/min into the nose as needed. Oxygen      . omeprazole (PRILOSEC) 20 MG capsule Take 1 capsule (20 mg total) by mouth daily.  30 capsule  6  . potassium chloride SA (K-DUR,KLOR-CON) 20 MEQ tablet Take 20 mEq by mouth daily.      . spironolactone (ALDACTONE) 25 MG tablet Take 0.5 tablets (12.5 mg total) by mouth daily.  30 tablet  3  . Tadalafil, PAH, (ADCIRCA) 20 MG TABS Take 2 tablets (40 mg total) by mouth daily.   60 tablet  3  . traMADol (ULTRAM) 50 MG tablet Take 50 mg by mouth every 6 (six) hours as needed.      . warfarin (COUMADIN) 5 MG tablet TAKE ONE & ONE-HALF TABLETS BY MOUTH EVERY DAY EXCEPT SUNDAYS AND THURSDAYS, TAKE ONE EVERY DAY ON THOSE DAYS.  45 tablet  3    Past Medical History  Diagnosis Date  . SOB (shortness of breath) on exertion   . Other chronic pulmonary heart diseases   . Chronic obstructive asthma with exacerbation     CT scan was a pattern but no evidence of pulmonary embolus and., Negative VQ scan July 2012 pulmonary function test FVC 1.17 L per minute 37% of predicted, FEV1 1 L 40% of predicted. DLCO 61% of predicted no response to bronchodilators negative HIV serology, ANA in scleroderma.  . Tobacco use disorder   . Obesity, unspecified   . Other and unspecified hyperlipidemia   . Cardiomegaly     Normal LV systolic function ejection fraction 55-60% with moderate decrease in RV function.  . Other diseases of lung, not elsewhere classified   . Hepatomegaly   . Body mass index 40.0-44.9, adult   . Edema   . Pulmonary arterial hypertension      PA pressure 112/54 mmHg the mean of 74 mmHg, right atrial pressure mean 25 mmHg. Pulmonary vascular resistance 13 with units. Cardiac output 5.8 L. Normal coronary arteries with normal LV function July 2012   . Obstructive sleep apnea     BiPAP initiated July 2012    ROS:Pertinent positives as outlined above. The remainder of the 18  point review of systems is negative  PHYSICAL EXAM Filed Vitals:   10/02/12 1351  BP: 122/72  Pulse: 116  Weight: 242 lb 6.4 oz (109.952 kg)  SpO2: 92%   General: Well appearing. No respiratory difficulty, on home O2. (husband present) HEENT: normal  Neck: supple. Thick. Hard to assess due to body habitus but JVP does not appear elelvated. Carotids 2+ bilat; no bruits. No lymphadenopathy or thryomegaly appreciated.  Cor: PMI nonpalpable. Distant. Regular rate and rhythm/ rubs, gallops or  murmurs. Lungs: diminished in bases 2 liters [place] Abdomen: obese, nondistended. nontender No hepatosplenomegaly. No bruits or masses. Good bowel sounds.  Extremities: no cyanosis, clubbing, rash, trace edema.   Neuro: alert & oriented x 3, cranial nerves grossly intact. moves all 4 extremities w/o difficulty. Affect pleasant.   ASSESSMENT AND PLAN    

## 2012-10-06 NOTE — CV Procedure (Addendum)
Cardiac Cath Procedure Note:  Indication: PAH  Procedures performed:  1) Right heart catheterization  Description of procedure:   The risks and indication of the procedure were explained. Consent was signed and placed on the chart. An appropriate timeout was taken prior to the procedure. The right groin was prepped and draped in the routine sterile fashion and anesthetized with 1% local lidocaine.   A 7 FR venous sheath was placed in the right femoral vein using a modified Seldinger technique. A standard Swan-Ganz catheter was used for the procedure.   Complications: None apparent.  Findings:  RA = 15 RV = 91/15/20 PA = 90/41 (64) PCW =  20 Fick cardiac output/index = 5.2/2.5 PVR = 8.4 Woods FA sat = 95% PA sat = 69%, 73% High SVC sat = 71%   Assessment:  1. Severe PAH with approximately 10% decrease in pressures on therapy 2. Normal cardiac output 3. No evidence of intracardiac shunting  Plan/Discussion:  Will try to diurese gently. Continue current therapy for now. May consider trial of inhaled or IV prostanoids at some point depending on what repeat PFTs look like.   Will increase metolazone to 2x/week with hopes of getting weight down at least 5 pounds. F/u 2-3 weeks. May need to switch back to torsemide if she can afford.  Daniel Bensimhon 2:11 PM

## 2012-10-06 NOTE — Interval H&P Note (Signed)
History and Physical Interval Note:  10/06/2012 1:13 PM  Alexis [name]  has presented today for surgery, with the diagnosis of pulmonary HTN.  The various methods of treatment have been discussed with the patient and family. After consideration of risks, benefits and other options for treatment, the patient has consented to  Procedure(s) (LRB) with comments: RIGHT HEART CATH (N/A) as a surgical intervention .  The patient's history has been reviewed, patient examined, no change in status, stable for surgery.  I have reviewed the patient's chart and labs.  Questions were answered to the patient's satisfaction.     Alexis [name]

## 2012-10-13 ENCOUNTER — Ambulatory Visit (INDEPENDENT_AMBULATORY_CARE_PROVIDER_SITE_OTHER): Payer: Self-pay | Admitting: *Deleted

## 2012-10-13 DIAGNOSIS — I272 Pulmonary hypertension, unspecified: Secondary | ICD-10-CM

## 2012-10-13 DIAGNOSIS — Z7901 Long term (current) use of anticoagulants: Secondary | ICD-10-CM

## 2012-10-13 DIAGNOSIS — I2789 Other specified pulmonary heart diseases: Secondary | ICD-10-CM

## 2012-10-13 LAB — POCT INR: INR: 1.7

## 2012-10-18 ENCOUNTER — Other Ambulatory Visit: Payer: Self-pay | Admitting: *Deleted

## 2012-10-18 MED ORDER — FUROSEMIDE 40 MG PO TABS: 40.0000 mg | ORAL_TABLET | Freq: Two times a day (BID) | ORAL | Status: DC

## 2012-10-21 ENCOUNTER — Other Ambulatory Visit: Payer: Self-pay

## 2012-10-25 ENCOUNTER — Telehealth (HOSPITAL_COMMUNITY): Payer: Self-pay | Admitting: Cardiology

## 2012-10-25 DIAGNOSIS — I5032 Chronic diastolic (congestive) heart failure: Secondary | ICD-10-CM

## 2012-10-25 MED ORDER — METOLAZONE 2.5 MG PO TABS: 2.5000 mg | ORAL_TABLET | ORAL | Status: DC

## 2012-10-25 MED ORDER — SPIRONOLACTONE 25 MG PO TABS: 25.0000 mg | ORAL_TABLET | Freq: Every day | ORAL | Status: DC

## 2012-10-25 MED ORDER — WARFARIN SODIUM 5 MG PO TABS: 5.0000 mg | ORAL_TABLET | Freq: Every day | ORAL | Status: DC

## 2012-10-25 NOTE — Telephone Encounter (Signed)
Pt calle dto request a refill on Spironolactone, Metolazone, and Warfarin  [facility]

## 2012-11-02 ENCOUNTER — Ambulatory Visit (HOSPITAL_COMMUNITY)
Admission: RE | Admit: 2012-11-02 | Discharge: 2012-11-02 | Disposition: A | Discharge status: Home / Self Care | Payer: Self-pay | Source: Ambulatory Visit | Attending: Internal Medicine | Admitting: Internal Medicine

## 2012-11-02 VITALS — BP 114/68 | HR 96 | Wt 250.0 lb

## 2012-11-02 DIAGNOSIS — I5032 Chronic diastolic (congestive) heart failure: Secondary | ICD-10-CM | POA: Insufficient documentation

## 2012-11-02 DIAGNOSIS — I2789 Other specified pulmonary heart diseases: Secondary | ICD-10-CM | POA: Insufficient documentation

## 2012-11-02 DIAGNOSIS — I2721 Secondary pulmonary arterial hypertension: Secondary | ICD-10-CM

## 2012-11-02 LAB — BASIC METABOLIC PANEL
CO2: 31 mEq/L (ref 19–32)
Chloride: 101 mEq/L (ref 96–112)
Creatinine, Ser: 0.69 mg/dL (ref 0.50–1.10)
GFR calc Af Amer: >90 mL/min (ref >90)
Potassium: 4 mEq/L (ref 3.5–5.1)
Sodium: 140 mEq/L (ref 135–145)

## 2012-11-02 MED ORDER — METOLAZONE 2.5 MG PO TABS: ORAL_TABLET | ORAL | Status: DC

## 2012-11-02 MED ORDER — FUROSEMIDE 40 MG PO TABS: ORAL_TABLET | ORAL | Status: DC

## 2012-11-02 NOTE — Patient Instructions (Addendum)
Stop lasix and start torsemide 60 mg daily.    Labs today.    Follow up 1 month with Dr. Haroldine Laws

## 2012-11-02 NOTE — Progress Notes (Signed)
PCP: Dr Frann Rider [place] [phone]  HPI Alexis [name] is a 57 year old female with history of PAH and diastolic dysfunction.   Intolerant Ventavis due to recurrent cough.  Initial presentation was to [facility] with acute on chronic respiratory failure. Based on the CT scan it was felt that the patient have severe pulmonary hypertension and was referred for diagnostic cardiac catheterization. Her PA pressures and mean pulmonary artery pressure were markedly elevated requiring initiation of oxygen therapy, anticoagulation and oral tadalafil. She had no significant coronary artery disease. She was found to have severe obstructive/restrictive lung disease but with very little response to bronchodilator therapy. She was then subsequently referred to Dr. Koleen Nimrod.   [place] April 02, 2011.  RA mean of 25, RV pressure is 117/13, PA 112/54 with mean of 74.  CO 5.8 liters. CI 3.3 liters.  PVR approximately 13 Woods units.   ECHO 06/2011 EF 60-65%. RV moderately dilated and mild to moderately hypokinetic with septal flattening RVSP 77 ECHO 10/02/12 Poor quality. EF 60-65% RV does appear better.   07/12/11 6MW at pulmonary rehab unchanged from August 2012. 900 feet. 04/26/12  6MW 870 feet O2 sats 93-94% on 2 liters [place]. 10/02/12 6MW 830 ft on 2L O2 with sats 87-90%  09/30/11 [facility]  RA = 10  RV = 103/14/14  PA =101/44 (68)  PCW = 19  Fick cardiac output/index = 4.7/2.4  PVR = 10.3 Woods FA sat = 95% (2L)  PA sat = 68%, 70%  10/06/12 [facility] on adcirca and macitentan  RA = 15  RV = 91/15/20  PA = 90/41 (64)  PCW = 20  Fick cardiac output/index = 5.2/2.5  PVR = 8.4 Woods  FA sat = 95%  PA sat = 69%, 73%  High SVC sat = 71%  She returns for post cath follow up today.  After cath her metolazone was increased to 2x weekly with continuation of lasix.  She remains on adcirca 40 mg daily and macitentan.  O2 at home around 90-94%.  Has not take lasix today.   She states her breathing is at  baseline which includes exertional dyspnea.  Weight fluctuates at home, 248-253 pounds.  States she is compliant with fluid restrictions and low sodium diet.    Allergies  Allergen Reactions  . Bactrim     Face and eye swelling  . Morphine And Related Other (See Comments)    Severe headache  . Penicillins     "doesn't work" got a lot as a child that doesn't work. But no systemic reaction.  . Sulfa Antibiotics Itching    Current Outpatient Prescriptions on File Prior to Encounter  Medication Sig Dispense Refill  . acetaminophen (TYLENOL) 500 MG tablet Take 1,000 mg by mouth every 6 (six) hours as needed. For pain      . albuterol (PROVENTIL HFA;VENTOLIN HFA) 108 (90 BASE) MCG/ACT inhaler Inhale 2 puffs into the lungs every 6 (six) hours as needed. For shortness of breath      . albuterol-ipratropium (COMBIVENT) 18-103 MCG/ACT inhaler Inhale 2 puffs into the lungs every 6 (six) hours as needed. For shortness of breath      . aspirin 81 MG EC tablet Take 81 mg by mouth daily.        Marland Kitchen bismuth subsalicylate (PEPTO BISMOL) 262 MG/15ML suspension Take 15 mLs by mouth every 6 (six) hours as needed. For nausea      . fluticasone (FLOVENT HFA) 220 MCG/ACT inhaler Inhale 1 puff into the lungs  2 (two) times daily as needed. For shortness of breath      . Macitentan (OPSUMIT) 10 MG TABS Take 10 mg by mouth daily.      Marland Kitchen omeprazole (PRILOSEC) 20 MG capsule Take 1 capsule (20 mg total) by mouth daily.  30 capsule  6  . potassium chloride SA (K-DUR,KLOR-CON) 20 MEQ tablet Take 20 mEq by mouth daily.      Marland Kitchen spironolactone (ALDACTONE) 25 MG tablet Take 1 tablet (25 mg total) by mouth daily.  30 tablet  30  . Tadalafil, PAH, (ADCIRCA) 20 MG TABS Take 40 mg by mouth daily.      Marland Kitchen warfarin (COUMADIN) 5 MG tablet Take 1-1.5 tablets (5-7.5 mg total) by mouth daily. Take 7.5 mg daily except take 69m on Sunday  60 tablet  1   No current facility-administered medications on file prior to encounter.    Past  Medical History  Diagnosis Date  . SOB (shortness of breath) on exertion   . Other chronic pulmonary heart diseases   . Chronic obstructive asthma with exacerbation     CT scan was a pattern but no evidence of pulmonary embolus and., Negative VQ scan July 2012 pulmonary function test FVC 1.17 L per minute 37% of predicted, FEV1 1 L 40% of predicted. DLCO 61% of predicted no response to bronchodilators negative HIV serology, ANA in scleroderma.  . Tobacco use disorder   . Obesity, unspecified   . Other and unspecified hyperlipidemia   . Cardiomegaly     Normal LV systolic function ejection fraction 55-60% with moderate decrease in RV function.  . Other diseases of lung, not elsewhere classified   . Hepatomegaly   . Body mass index 40.0-44.9, adult   . Edema   . Pulmonary arterial hypertension      PA pressure 112/54 mmHg the mean of 74 mmHg, right atrial pressure mean 25 mmHg. Pulmonary vascular resistance 13 with units. Cardiac output 5.8 L. Normal coronary arteries with normal LV function July 2012   . Obstructive sleep apnea     BiPAP initiated July 2012    RGYF:VCBSWHQPRpositives as outlined above. The remainder of the 18  point review of systems is negative  PHYSICAL EXAM Filed Vitals:   11/02/12 1405  BP: 114/68  Pulse: 96  Weight: 250 lb (113.399 kg)  SpO2: 94%   General: Well appearing. No respiratory difficulty, on O2 per n/c.  HEENT: normal  Neck: supple. Thick. Hard to assess due to body habitus but JVP does not appear elelvated. Carotids 2+ bilat; no bruits. No lymphadenopathy or thryomegaly appreciated.  Cor: PMI nonpalpable. Distant. Regular rate and rhythm Lungs: diminished in bases 2 liters [place] Abdomen: obese, +distention. nontender No hepatosplenomegaly. No bruits or masses. Good bowel sounds.  Extremities: no cyanosis, clubbing, rash, trace edema.   Neuro: alert & oriented x 3, cranial nerves grossly intact. moves all 4 extremities w/o difficulty. Affect  pleasant.   ASSESSMENT AND PLAN

## 2012-11-03 NOTE — Assessment & Plan Note (Signed)
As above, will change torsemide to demadex and reassess volume status. BMET today

## 2012-11-03 NOTE — Assessment & Plan Note (Signed)
Have reviewed recent cath and pressures down 10% on macitentan and tadalafil.  Currently working on optimizing fluid.  She continues to have fluid on board, with abdominal distention will change lasix to demadex 60 mg daily.  She has a difficult time paying for medications therefore she has been enrolled in the HF medication assistance program.  Will check labs today.  After optimization of fluid can reassess and discuss possible inhaled prostanoids.

## 2012-11-24 ENCOUNTER — Ambulatory Visit (INDEPENDENT_AMBULATORY_CARE_PROVIDER_SITE_OTHER): Payer: Self-pay | Admitting: *Deleted

## 2012-11-24 DIAGNOSIS — Z7901 Long term (current) use of anticoagulants: Secondary | ICD-10-CM

## 2012-11-24 DIAGNOSIS — I2789 Other specified pulmonary heart diseases: Secondary | ICD-10-CM

## 2012-11-24 DIAGNOSIS — I272 Pulmonary hypertension, unspecified: Secondary | ICD-10-CM

## 2012-11-24 LAB — POCT INR: INR: 2

## 2012-12-07 ENCOUNTER — Ambulatory Visit (HOSPITAL_COMMUNITY)
Admission: RE | Admit: 2012-12-07 | Discharge: 2012-12-07 | Disposition: A | Discharge status: Home / Self Care | Payer: Self-pay | Source: Ambulatory Visit | Attending: Internal Medicine | Admitting: Internal Medicine

## 2012-12-07 VITALS — BP 100/68 | HR 112 | Wt 244.0 lb

## 2012-12-07 DIAGNOSIS — I2721 Secondary pulmonary arterial hypertension: Secondary | ICD-10-CM

## 2012-12-07 DIAGNOSIS — I2789 Other specified pulmonary heart diseases: Secondary | ICD-10-CM | POA: Insufficient documentation

## 2012-12-07 DIAGNOSIS — R109 Unspecified abdominal pain: Secondary | ICD-10-CM | POA: Insufficient documentation

## 2012-12-07 MED ORDER — OMEPRAZOLE 20 MG PO CPDR: 20.0000 mg | DELAYED_RELEASE_CAPSULE | Freq: Every day | ORAL | Status: DC

## 2012-12-07 NOTE — Assessment & Plan Note (Signed)
Have refilled PPI. She will make an appt with GI in [place]. If she cannot get provider there we can try to refer locally.

## 2012-12-07 NOTE — Assessment & Plan Note (Signed)
Stable. NYHA III. Continue current therapy. Stressed need for weight loss. I do not think she is candidate for Flolan given mixed PH.

## 2012-12-07 NOTE — Progress Notes (Signed)
PCP: Dr Frann Rider [place] [phone]  HPI [name] is a 57 year old female with history of obesity, COPD,  PAH and diastolic dysfunction.   Initial presentation was to [facility] in 2012 with acute on chronic respiratory failure. Based on the CT scan it was felt that the patient have severe pulmonary hypertension and was referred for diagnostic cardiac catheterization. Her PA pressures and mean pulmonary artery pressure were markedly elevated requiring initiation of oxygen therapy, anticoagulation and oral tadalafil. She had no significant coronary artery disease. She was found to have severe obstructive/restrictive lung disease but with very little response to bronchodilator therapy. She was then subsequently referred to Dr. Koleen Nimrod.   [place] April 02, 2011.  RA mean of 25, RV pressure is 117/13, PA 112/54 with mean of 74.  CO 5.8 liters. CI 3.3 liters.  PVR approximately 13 Woods units. Started on sildenafil.   ECHO 06/2011 EF 60-65%. RV moderately dilated and mild to moderately hypokinetic with septal flattening RVSP 77 ECHO 10/02/12 Poor quality. EF 60-65% RV does appear better.   07/12/11 6MW at pulmonary rehab unchanged from August 2012. 900 feet. 04/26/12  6MW 870 feet O2 sats 93-94% on 2 liters [place]. 10/02/12 6MW 830 ft on 2L O2 with sats 87-90%  10/06/12 [facility] on adcirca and macitentan  RA = 15  RV = 91/15/20  PA = 90/41 (64)  PCW = 20  Fick cardiac output/index = 5.2/2.5  PVR = 8.4 Woods  FA sat = 95%  PA sat = 69%, 73%  High SVC sat = 71%  Has been intolerant of Ventavis due to cough. Remains on macitentan and adcirca.  Says breathing is stable. Dyspneic with mild exertion.   She returns for post cath follow up today.  After cath her metolazone was increased to 2x weekly with continuation of lasix.  She remains on adcirca 40 mg daily and macitentan.  O2 at home around 90-94%. Has gained 30 pounds over past year and a half. But down 6 pounds since last visit. Main  complain is nauseas over past few days. Having a lot of gas and burping. Says she eats more to make it feel better. Pain in LUQ.  Has occasional blood on toilet paper after bowel movements. No melena. Had ab CT at [facility] in November which she says was ok.  Takes metolazone 2x/week.  States she is compliant with fluid restrictions and low sodium diet.    Allergies  Allergen Reactions  . Bactrim     Face and eye swelling  . Morphine And Related Other (See Comments)    Severe headache  . Penicillins     "doesn't work" got a lot as a child that doesn't work. But no systemic reaction.  . Sulfa Antibiotics Itching    Current Outpatient Prescriptions on File Prior to Encounter  Medication Sig Dispense Refill  . acetaminophen (TYLENOL) 500 MG tablet Take 1,000 mg by mouth every 6 (six) hours as needed. For pain      . albuterol (PROVENTIL HFA;VENTOLIN HFA) 108 (90 BASE) MCG/ACT inhaler Inhale 2 puffs into the lungs every 6 (six) hours as needed. For shortness of breath      . aspirin 81 MG EC tablet Take 81 mg by mouth daily.        Marland Kitchen bismuth subsalicylate (PEPTO BISMOL) 262 MG/15ML suspension Take 15 mLs by mouth every 6 (six) hours as needed. For nausea      . fluticasone (FLOVENT HFA) 220 MCG/ACT inhaler Inhale  1 puff into the lungs 2 (two) times daily as needed. For shortness of breath      . Macitentan (OPSUMIT) 10 MG TABS Take 10 mg by mouth daily.      . metolazone (ZAROXOLYN) 2.5 MG tablet On Monday and Friday.  May take extra if needed.  10 tablet  3  . omeprazole (PRILOSEC) 20 MG capsule Take 1 capsule (20 mg total) by mouth daily.  30 capsule  6  . potassium chloride SA (K-DUR,KLOR-CON) 20 MEQ tablet Take 20 mEq by mouth 2 (two) times daily.       Marland Kitchen spironolactone (ALDACTONE) 25 MG tablet Take 1 tablet (25 mg total) by mouth daily.  30 tablet  30  . Tadalafil, PAH, (ADCIRCA) 20 MG TABS Take 40 mg by mouth daily.      Marland Kitchen warfarin (COUMADIN) 5 MG tablet Take 1-1.5 tablets (5-7.5 mg  total) by mouth daily. Take 7.5 mg daily except take 15m on Sunday  60 tablet  1   No current facility-administered medications on file prior to encounter.    Past Medical History  Diagnosis Date  . SOB (shortness of breath) on exertion   . Other chronic pulmonary heart diseases   . Chronic obstructive asthma with exacerbation     CT scan was a pattern but no evidence of pulmonary embolus and., Negative VQ scan July 2012 pulmonary function test FVC 1.17 L per minute 37% of predicted, FEV1 1 L 40% of predicted. DLCO 61% of predicted no response to bronchodilators negative HIV serology, ANA in scleroderma.  . Tobacco use disorder   . Obesity, unspecified   . Other and unspecified hyperlipidemia   . Cardiomegaly     Normal LV systolic function ejection fraction 55-60% with moderate decrease in RV function.  . Other diseases of lung, not elsewhere classified   . Hepatomegaly   . Body mass index 40.0-44.9, adult   . Edema   . Pulmonary arterial hypertension      PA pressure 112/54 mmHg the mean of 74 mmHg, right atrial pressure mean 25 mmHg. Pulmonary vascular resistance 13 with units. Cardiac output 5.8 L. Normal coronary arteries with normal LV function July 2012   . Obstructive sleep apnea     BiPAP initiated July 2012     PHYSICAL EXAM Filed Vitals:   12/07/12 0819  BP: 100/68  Pulse: 112  Weight: 244 lb (110.678 kg)  SpO2: 93%   General: Chronically ill appearing No respiratory difficulty, on O2 per n/c.  HEENT: normal  Neck: supple. Thick. Hard to assess due to body habitus but JVP does not appear elelvated. Carotids 2+ bilat; no bruits. No lymphadenopathy or thryomegaly appreciated.  Cor: PMI nonpalpable. Distant. Regular rate and rhythm Lungs: diminished in bases 2 liters [place] Abdomen: obese, +distention. Diffuse mild tenderness. Worse in RUQ tender No rebound Good bowel sounds.  Extremities: no cyanosis, clubbing, rash, trace edema.   Neuro: alert & oriented x 3, cranial  nerves grossly intact. moves all 4 extremities w/o difficulty. Affect pleasant.   ASSESSMENT AND PLAN

## 2012-12-07 NOTE — Patient Instructions (Addendum)
   Follow up in 4 months  Do the following things EVERYDAY: 1) Weigh yourself in the morning before breakfast. Write it down and keep it in a log. 2) Take your medicines as prescribed 3) Eat low salt foods-Limit salt (sodium) to 2000 mg per day.  4) Stay as active as you can everyday 5) Limit all fluids for the day to less than 2 liters

## 2013-01-05 ENCOUNTER — Telehealth: Payer: Self-pay | Admitting: *Deleted

## 2013-01-05 NOTE — Telephone Encounter (Signed)
Had nose bleed last night after sneezing.  Stopped after applying pressure and ice.  Started again this morning.  Came to ED at [facility]..  INR was 1.4.  (goal is 1.5 - 2.0)  They told her to continue current dose.  Nose bleed stopped now.  F/U INR appt made.  Pt to call if symptoms return or persist.  She verbalized understanding.

## 2013-01-26 ENCOUNTER — Telehealth: Payer: Self-pay

## 2013-01-26 DIAGNOSIS — Z7901 Long term (current) use of anticoagulants: Secondary | ICD-10-CM

## 2013-01-26 MED ORDER — WARFARIN SODIUM 5 MG PO TABS: ORAL_TABLET | ORAL | Status: DC

## 2013-01-26 NOTE — Telephone Encounter (Signed)
Patient called requesting Warfarin called in she takes 1/2 tab daily and 1 tab on Sunday.  Call in to [facility].

## 2013-01-30 ENCOUNTER — Ambulatory Visit (INDEPENDENT_AMBULATORY_CARE_PROVIDER_SITE_OTHER): Payer: Self-pay | Admitting: *Deleted

## 2013-01-30 DIAGNOSIS — Z7901 Long term (current) use of anticoagulants: Secondary | ICD-10-CM

## 2013-01-30 DIAGNOSIS — I272 Pulmonary hypertension, unspecified: Secondary | ICD-10-CM

## 2013-01-30 DIAGNOSIS — I2789 Other specified pulmonary heart diseases: Secondary | ICD-10-CM

## 2013-01-30 LAB — POCT INR: INR: 1.5

## 2013-03-06 ENCOUNTER — Encounter: Payer: Self-pay | Admitting: Internal Medicine

## 2013-03-06 ENCOUNTER — Telehealth (HOSPITAL_COMMUNITY): Payer: Self-pay | Admitting: *Deleted

## 2013-03-06 MED ORDER — POTASSIUM CHLORIDE CRYS ER 20 MEQ PO TBCR: 40.0000 meq | EXTENDED_RELEASE_TABLET | Freq: Two times a day (BID) | ORAL | Status: DC

## 2013-03-06 NOTE — Telephone Encounter (Signed)
Pt called earlier today c/o cramping in legs and arms, she is concerned about low Potassium, she stated she was at [facility] with her husband, who is having an outpatient procedure and there lab can draw labs on her, order for bmet was faxed to them at [phone].  Louie Casa, with the Lab called to report K is 2.9 discussed w/Amy Ninfa Meeker, NP she would like pt to take an extra 60 meq of KCL today and then increase dose to 40 BID, taking an extra 20 when she takes Metolazone, pt is aware and agreeable, she will go to [facility] next Tue for repeat labs, order faxed to them at [phone]

## 2013-03-23 ENCOUNTER — Ambulatory Visit (INDEPENDENT_AMBULATORY_CARE_PROVIDER_SITE_OTHER): Payer: Self-pay | Admitting: *Deleted

## 2013-03-23 DIAGNOSIS — Z7901 Long term (current) use of anticoagulants: Secondary | ICD-10-CM

## 2013-03-23 DIAGNOSIS — I272 Pulmonary hypertension, unspecified: Secondary | ICD-10-CM

## 2013-03-23 DIAGNOSIS — I2789 Other specified pulmonary heart diseases: Secondary | ICD-10-CM

## 2013-04-10 ENCOUNTER — Ambulatory Visit (INDEPENDENT_AMBULATORY_CARE_PROVIDER_SITE_OTHER): Payer: Self-pay | Admitting: *Deleted

## 2013-04-10 DIAGNOSIS — Z7901 Long term (current) use of anticoagulants: Secondary | ICD-10-CM

## 2013-04-10 DIAGNOSIS — I272 Pulmonary hypertension, unspecified: Secondary | ICD-10-CM

## 2013-04-10 DIAGNOSIS — I2789 Other specified pulmonary heart diseases: Secondary | ICD-10-CM

## 2013-04-10 LAB — POCT INR: INR: 1.6

## 2013-04-16 ENCOUNTER — Encounter (HOSPITAL_COMMUNITY): Payer: Self-pay

## 2013-04-16 ENCOUNTER — Ambulatory Visit (HOSPITAL_COMMUNITY)
Admission: RE | Admit: 2013-04-16 | Discharge: 2013-04-16 | Disposition: A | Discharge status: Home / Self Care | Payer: Self-pay | Source: Ambulatory Visit | Attending: Internal Medicine | Admitting: Internal Medicine

## 2013-04-16 VITALS — BP 122/60 | HR 96 | Wt 243.2 lb

## 2013-04-16 DIAGNOSIS — I2789 Other specified pulmonary heart diseases: Secondary | ICD-10-CM | POA: Insufficient documentation

## 2013-04-16 DIAGNOSIS — I5032 Chronic diastolic (congestive) heart failure: Secondary | ICD-10-CM | POA: Insufficient documentation

## 2013-04-16 DIAGNOSIS — I2721 Secondary pulmonary arterial hypertension: Secondary | ICD-10-CM

## 2013-04-16 LAB — BASIC METABOLIC PANEL
CO2: 35 mEq/L — ABNORMAL HIGH (ref 19–32)
Chloride: 89 mEq/L — ABNORMAL LOW (ref 96–112)
Creatinine, Ser: 1.01 mg/dL (ref 0.50–1.10)

## 2013-04-16 MED ORDER — POTASSIUM CHLORIDE CRYS ER 20 MEQ PO TBCR: 60.0000 meq | EXTENDED_RELEASE_TABLET | Freq: Two times a day (BID) | ORAL | Status: DC

## 2013-04-16 NOTE — Progress Notes (Signed)
Patient ID: Alexis [name], female   DOB: Mar 03, 1956, 57 y.o.   MRN: [id number] PCP: Dr Frann Rider [facility] [phone]  HPI Alexis [name] is a 57 year old female with history of obesity, severe COPD,  PAH and diastolic dysfunction.   Initial presentation was to [facility] in 2012 with acute on chronic respiratory failure. Based on the CT scan it was felt that the patient had severe pulmonary hypertension and was referred for diagnostic cardiac catheterization. Her PA pressures and mean pulmonary artery pressure were markedly elevated requiring initiation of oxygen therapy, anticoagulation and oral tadalafil. She had no significant coronary artery disease. She was found to have severe obstructive/restrictive lung disease but with very little response to bronchodilator therapy. She was then subsequently referred to Dr. Koleen Nimrod.   Initial [facility] April 02, 2011.  RA mean of 25, RV pressure is 117/13, PA 112/54 with mean of 74.  CO 5.8 liters. CI 3.3 liters.  PVR approximately 13 Woods units. Started on sildenafil.   July 2012 pulmonary function test FVC 1.17 L per minute 37% of predicted, FEV1 1 L 40% of predicted. DLCO 61%  ECHO 06/2011 EF 60-65%. RV moderately dilated and mild to moderately hypokinetic with septal flattening RVSP 77 ECHO 10/02/12 Poor quality. EF 60-65% RV does appear better.   07/12/11 6MW at pulmonary rehab unchanged from August 2012. 900 feet. 04/26/12  6MW 870 feet O2 sats 93-94% on 2 liters [place]. 10/02/12 6MW 830 ft on 2L O2 with sats 87-90% 04/16/13            6MC 760 ft on 2L O2 sats 89-93%  10/06/12 [place] on adcirca and macitentan  RA = 15  RV = 91/15/20  PA = 90/41 (64)  PCW = 20  Fick cardiac output/index = 5.2/2.5  PVR = 8.4 Woods  FA sat = 95%  PA sat = 69%, 73%  High SVC sat = 71%  Has been intolerant of Ventavis due to cough. Remains on macitentan and adcirca.  Says breathing is stable. Dyspneic with mild exertion.   Labs 7/14: BUN 15, Creatinine  0.77, K+ 4.7  Follow up: Remains on adcirca and macitentan. + frequent leg cramps. Has good days and bad days. Feels better when fluid is off but in genereal NYHA III-IIIB + SOB with exertion. Denies CP, orthopnea or edema. Weight at home 250-255 lbs. O2 sats at home 92-98%. She cut metolazone back to 1 week d/t cramping. Following low salt diet and drinking less than 2 L day. Has gained back about 20 pounds over the past year.   Allergies  Allergen Reactions  . Bactrim     Face and eye swelling  . Morphine And Related Other (See Comments)    Severe headache  . Penicillins     "doesn't work" got a lot as a child that doesn't work. But no systemic reaction.  . Sulfa Antibiotics Itching   Current Outpatient Prescriptions on File Prior to Encounter  Medication Sig Dispense Refill  . acetaminophen (TYLENOL) 500 MG tablet Take 1,000 mg by mouth every 6 (six) hours as needed. For pain      . albuterol (PROVENTIL HFA;VENTOLIN HFA) 108 (90 BASE) MCG/ACT inhaler Inhale 2 puffs into the lungs every 6 (six) hours as needed. For shortness of breath      . aspirin 81 MG EC tablet Take 81 mg by mouth daily.        . fluticasone (FLOVENT HFA) 220 MCG/ACT inhaler Inhale 1 puff into the lungs 2 (  two) times daily as needed. For shortness of breath      . Macitentan (OPSUMIT) 10 MG TABS Take 10 mg by mouth daily.      Marland Kitchen omeprazole (PRILOSEC) 20 MG capsule Take 1 capsule (20 mg total) by mouth daily.  90 capsule  3  . potassium chloride SA (K-DUR,KLOR-CON) 20 MEQ tablet Take 2 tablets (40 mEq total) by mouth 2 (two) times daily.  120 tablet  3  . spironolactone (ALDACTONE) 25 MG tablet Take 1 tablet (25 mg total) by mouth daily.  30 tablet  30  . Tadalafil, PAH, (ADCIRCA) 20 MG TABS Take 40 mg by mouth daily.      Marland Kitchen torsemide (DEMADEX) 20 MG tablet Take 2 tabs in AM and 1 tab in PM      . warfarin (COUMADIN) 5 MG tablet Take 7.5 mg daily except take 73m on Sunday  60 tablet  3   No current  facility-administered medications on file prior to encounter.    Past Medical History  Diagnosis Date  . SOB (shortness of breath) on exertion   . Other chronic pulmonary heart diseases   . Chronic obstructive asthma with exacerbation     CT scan was a pattern but no evidence of pulmonary embolus and., Negative VQ scan July 2012 pulmonary function test FVC 1.17 L per minute 37% of predicted, FEV1 1 L 40% of predicted. DLCO 61% of predicted no response to bronchodilators negative HIV serology, ANA in scleroderma.  . Tobacco use disorder   . Obesity, unspecified   . Other and unspecified hyperlipidemia   . Cardiomegaly     Normal LV systolic function ejection fraction 55-60% with moderate decrease in RV function.  . Other diseases of lung, not elsewhere classified   . Hepatomegaly   . Body mass index 40.0-44.9, adult   . Edema   . Pulmonary arterial hypertension      PA pressure 112/54 mmHg the mean of 74 mmHg, right atrial pressure mean 25 mmHg. Pulmonary vascular resistance 13 with units. Cardiac output 5.8 L. Normal coronary arteries with normal LV function July 2012   . Obstructive sleep apnea     BiPAP initiated July 2012   PHYSICAL EXAM Filed Vitals:   04/16/13 1129  BP: 122/60  Pulse: 96  Weight: 243 lb 4 oz (110.337 kg)  SpO2: 91%   General: Chronically ill appearing No respiratory difficulty, on O2 per n/c. Husband present HEENT: normal  Neck: supple. Thick. Hard to assess due to body habitus but JVP does not appear elelvated. Carotids 2+ bilat; no bruits. No lymphadenopathy or thryomegaly appreciated.  Cor: PMI nonpalpable. Distant. Regular rate and rhythm Lungs: diminished throughout 2 liters [place] Abdomen: obese, +mild distention. no tenderness. Good bowel sounds.  Extremities: no cyanosis, clubbing, rash, trace edema.   Neuro: alert & oriented x 3, cranial nerves grossly intact. moves all 4 extremities w/o difficulty. Affect pleasant.  ASSESSMENT AND PLAN  1) PAH -  6MW decreased over the past 2 years, currently on macitentan and Adcirca - will try to start Tyvaso if we can get assistance program to help with cost - continue continuous O2  2) Chronic diastolic HF - Weight stable. NYHA III symptoms. Volume status good. - continue current dose of torsemide and weekly metolazone - Recheck BMET today - consider getting another ECHO in next couple months  3) COPD on home O2 - continue O2  4) Morbid obesity - Discussed trying to lose some weight.  Patient seen  and examined with Junie Bame, NP. We discussed all aspects of the encounter. I agree with the assessment and plan as stated above.   She has severe PAH which is multifactorial. Has had some benefit from macitentan and Adcirca but 6MW now getting worse. PVR remains very high. Previously unable to tolerate Ventavis due to cough. Cardiac output not low enough to consider IV therapies. Will try Tyvaso. Will need repeat echo in the near future. Stressed need to use O2 all the time and continue weight loss efforts. She is doing well with volume management. Reinforced need for daily weights and reviewed use of sliding scale diuretics. Will check labs today.   F/u 6-8 weeks.  Chaysen Tillman,MD 12:46 PM

## 2013-04-16 NOTE — Patient Instructions (Addendum)
Will start you on Tyvaso  We will contact you in 2 months to schedule your next appointment.

## 2013-07-04 ENCOUNTER — Telehealth: Payer: Self-pay

## 2013-07-04 NOTE — Telephone Encounter (Signed)
pharm called to get refill on opsumit 10 mg. i gave rx for opsumit 10 mg

## 2013-07-12 ENCOUNTER — Other Ambulatory Visit: Payer: Self-pay

## 2013-07-18 ENCOUNTER — Encounter (HOSPITAL_COMMUNITY): Payer: Self-pay

## 2013-07-18 ENCOUNTER — Ambulatory Visit (HOSPITAL_COMMUNITY)
Admission: RE | Admit: 2013-07-18 | Discharge: 2013-07-18 | Disposition: A | Discharge status: Home / Self Care | Payer: Medicaid - Out of State | Source: Ambulatory Visit | Attending: Internal Medicine | Admitting: Internal Medicine

## 2013-07-18 VITALS — BP 116/66 | HR 85 | Wt 248.0 lb

## 2013-07-18 DIAGNOSIS — I517 Cardiomegaly: Secondary | ICD-10-CM | POA: Insufficient documentation

## 2013-07-18 DIAGNOSIS — I10 Essential (primary) hypertension: Secondary | ICD-10-CM | POA: Insufficient documentation

## 2013-07-18 DIAGNOSIS — E669 Obesity, unspecified: Secondary | ICD-10-CM | POA: Insufficient documentation

## 2013-07-18 DIAGNOSIS — J449 Chronic obstructive pulmonary disease, unspecified: Secondary | ICD-10-CM | POA: Insufficient documentation

## 2013-07-18 DIAGNOSIS — E785 Hyperlipidemia, unspecified: Secondary | ICD-10-CM | POA: Insufficient documentation

## 2013-07-18 DIAGNOSIS — J4489 Other specified chronic obstructive pulmonary disease: Secondary | ICD-10-CM | POA: Insufficient documentation

## 2013-07-18 DIAGNOSIS — F17200 Nicotine dependence, unspecified, uncomplicated: Secondary | ICD-10-CM | POA: Insufficient documentation

## 2013-07-18 DIAGNOSIS — R0602 Shortness of breath: Secondary | ICD-10-CM | POA: Insufficient documentation

## 2013-07-18 DIAGNOSIS — G4733 Obstructive sleep apnea (adult) (pediatric): Secondary | ICD-10-CM | POA: Insufficient documentation

## 2013-07-18 DIAGNOSIS — I2721 Secondary pulmonary arterial hypertension: Secondary | ICD-10-CM

## 2013-07-18 DIAGNOSIS — I5032 Chronic diastolic (congestive) heart failure: Secondary | ICD-10-CM

## 2013-07-18 DIAGNOSIS — I2789 Other specified pulmonary heart diseases: Secondary | ICD-10-CM | POA: Insufficient documentation

## 2013-07-18 DIAGNOSIS — I519 Heart disease, unspecified: Secondary | ICD-10-CM | POA: Insufficient documentation

## 2013-07-18 LAB — BASIC METABOLIC PANEL
CO2: 28 mEq/L (ref 19–32)
Glucose, Bld: 121 mg/dL — ABNORMAL HIGH (ref 70–99)
Potassium: 4 mEq/L (ref 3.5–5.1)
Sodium: 139 mEq/L (ref 135–145)

## 2013-07-18 NOTE — Patient Instructions (Signed)
Follow up in 2 months  Do the following things EVERYDAY: 1) Weigh yourself in the morning before breakfast. Write it down and keep it in a log. 2) Take your medicines as prescribed 3) Eat low salt foods-Limit salt (sodium) to 2000 mg per day.  4) Stay as active as you can everyday 5) Limit all fluids for the day to less than 2 liters 

## 2013-07-18 NOTE — Progress Notes (Signed)
Patient ID: Alexis [name], female   DOB: November 04, 1955, 57 y.o.   MRN: [id number] PCP: Dr Frann Rider [place] [phone]  HPI Alexis [name] is a 57 year old female with history of obesity, severe COPD, mixed PAH and diastolic dysfunction.   Initial presentation was to [facility] in 2012 with acute on chronic respiratory failure. Based on the CT scan it was felt that the patient had severe pulmonary hypertension and was referred for diagnostic cardiac catheterization. Coronaries were normal. Her PA pressures and mean pulmonary artery pressure were markedly elevated. She was found to have severe obstructive/restrictive lung disease but with very little response to bronchodilator therapy. VQ and CT negative for PE. She was then subsequently referred to Dr. Koleen Nimrod. Her PH was felt to be out of proportion to he COPD and she was started on PDE-5 inhibitor and macitentan was added, She has been intolerant of Ventavis due to cough.   Initial RHC April 02, 2011.  RA mean of 25 RV pressure is 117/13 PA 112/54 with mean of 74.  CO 5.8 liters. CI 3.3 liters.   PVR approximately 13 Woods units. Started on sildenafil (switched to tadalafil)  July 2012 pulmonary function test FVC 1.17 L per minute 37% of predicted, FEV1 1 L 40% of predicted. DLCO 61%  ECHO 06/2011 EF 60-65%. RV moderately dilated and mild to moderately hypokinetic with septal flattening RVSP 77 ECHO 10/02/12 Poor quality. EF 60-65% RV does appear better.   07/12/11 6MW at pulmonary rehab unchanged from August 2012. 900 feet. 04/26/12  6MW 870 feet O2 sats 93-94% on 2 liters [place]. 10/02/12 6MW 830 ft on 2L O2 with sats 87-90% 04/16/13            6MC 760 ft on 2L O2 sats 89-93%  10/06/12 [place] on adcirca and macitentan  RA = 15  RV = 91/15/20  PA = 90/41 (64)  PCW = 20  Fick cardiac output/index = 5.2/2.5  PVR = 8.4 Woods  FA sat = 95%  PA sat = 69%, 73%  High SVC sat = 71%   Labs 7/14: BUN 15, Creatinine 0.77, K+ 4.7 Labs  04/16/13: K 3.4 Creatinine 1.0 BUN 24   She returns for follow up. Remains on adcirca and macitentan. She has not problems at rest but ongoing exertional dyspnea. + Orthopnea sleeps on a wedge.No presyncope.  Remains on 2- 3 liters [place]. O2 sats 92-95% with exertion 88-93%. Weight at home 240-250 pounds but only weights 1x/week,. She takes Metolazone about once every 2 weeks.  Following low salt diet and drinking less than 2 L day.   Allergies  Allergen Reactions  . Bactrim     Face and eye swelling  . Morphine And Related Other (See Comments)    Severe headache  . Penicillins     "doesn't work" got a lot as a child that doesn't work. But no systemic reaction.  . Sulfa Antibiotics Itching   Current Outpatient Prescriptions on File Prior to Encounter  Medication Sig Dispense Refill  . acetaminophen (TYLENOL) 500 MG tablet Take 1,000 mg by mouth every 6 (six) hours as needed. For pain      . aspirin 81 MG EC tablet Take 81 mg by mouth daily.        . Macitentan (OPSUMIT) 10 MG TABS Take 10 mg by mouth daily.      . metolazone (ZAROXOLYN) 2.5 MG tablet As needed      . omeprazole (PRILOSEC) 20 MG capsule Take 1 capsule (  20 mg total) by mouth daily.  90 capsule  3  . potassium chloride SA (K-DUR,KLOR-CON) 20 MEQ tablet Take 3 tablets (60 mEq total) by mouth 2 (two) times daily.  180 tablet  3  . spironolactone (ALDACTONE) 25 MG tablet Take 1 tablet (25 mg total) by mouth daily.  30 tablet  30  . Tadalafil, PAH, (ADCIRCA) 20 MG TABS Take 40 mg by mouth daily.      Marland Kitchen torsemide (DEMADEX) 20 MG tablet Take 2 tabs in AM and 1 tab in PM      . warfarin (COUMADIN) 5 MG tablet Take 7.5 mg daily except take 80m on Sunday  60 tablet  3   No current facility-administered medications on file prior to encounter.    Past Medical History  Diagnosis Date  . SOB (shortness of breath) on exertion   . Other chronic pulmonary heart diseases   . Chronic obstructive asthma with exacerbation     CT scan was a  pattern but no evidence of pulmonary embolus and., Negative VQ scan July 2012 pulmonary function test FVC 1.17 L per minute 37% of predicted, FEV1 1 L 40% of predicted. DLCO 61% of predicted no response to bronchodilators negative HIV serology, ANA in scleroderma.  . Tobacco use disorder   . Obesity, unspecified   . Other and unspecified hyperlipidemia   . Cardiomegaly     Normal LV systolic function ejection fraction 55-60% with moderate decrease in RV function.  . Other diseases of lung, not elsewhere classified   . Hepatomegaly   . Body mass index 40.0-44.9, adult   . Edema   . Pulmonary arterial hypertension      PA pressure 112/54 mmHg the mean of 74 mmHg, right atrial pressure mean 25 mmHg. Pulmonary vascular resistance 13 with units. Cardiac output 5.8 L. Normal coronary arteries with normal LV function July 2012   . Obstructive sleep apnea     BiPAP initiated July 2012   PHYSICAL EXAM Filed Vitals:   07/18/13 1020  BP: 116/66  Pulse: 85  Weight: 248 lb (112.492 kg)  SpO2: 96%   General: Chronically ill appearing No respiratory difficulty, on O2 per n/c. Husband present HEENT: normal  Neck: supple. Thick. Hard to assess due to body habitus but JVP does not appear elelvated. Carotids 2+ bilat; no bruits. No lymphadenopathy or thryomegaly appreciated.  Cor: PMI nonpalpable. Distant. Regular rate and rhythm. Loud P2. 2/6 TR Lungs: diminished throughout 2 liters [place] Abdomen: obese, +mild distention. no tenderness. Good bowel sounds.  Extremities: no cyanosis, clubbing, rash, trace edema.   Neuro: alert & oriented x 3, cranial nerves grossly intact. moves all 4 extremities w/o difficulty. Affect pleasant.  ASSESSMENT AND PLAN  1) PAH - Continue macitentan and Adcirca at current regimen - Tyvaso pending if we can get assistance program to help with cost - continue continuous O2  2) Chronic diastolic HF - Weight stable. NYHA III symptoms. Volume status good. - continue current  dose of torsemide and weekly metolazone - consider getting another ECHO in next couple months -Check BMET  3) COPD on home O2 - continuous 2 liters [place]  4) Morbid obesity - Discussed trying to lose some weight.   Follow up in 2 months .  CLEGG,AMY NP-C 10:25 AM   Patient seen and examined with ADarrick Grinder NP. We discussed all aspects of the encounter. I agree with the assessment and plan as stated above.   Difficult case. She continues with NYHA III  symptoms. Her PAH is really a mixed picture but degree of PAH out of proportion to her COPD and left hear failure suggesting signifcant WHO Group I component.We will continue combination therapy. She has not been able to tolerate Ventavis and we are now trying to obtain Tyvaso for her, Oral trepostinol is also an option but outcomes are not very convincing. She is not interested in IV therapies at this point.   Once again reiterated need for weight loss. Also, reinforced need for daily weights and reviewed use of sliding scale diuretics.    Melonee Gerstel,MD 6:45 PM

## 2013-09-18 LAB — PULMONARY FUNCTION TEST

## 2013-09-20 ENCOUNTER — Other Ambulatory Visit (HOSPITAL_COMMUNITY): Payer: Self-pay | Admitting: *Deleted

## 2013-09-20 MED ORDER — TADALAFIL (PAH) 20 MG PO TABS: 40.0000 mg | ORAL_TABLET | Freq: Every day | ORAL | Status: DC

## 2013-09-20 MED ORDER — MACITENTAN 10 MG PO TABS: 10.0000 mg | ORAL_TABLET | Freq: Every day | ORAL | Status: DC

## 2013-10-02 ENCOUNTER — Other Ambulatory Visit (HOSPITAL_COMMUNITY): Payer: Self-pay

## 2013-10-02 MED ORDER — MACITENTAN 10 MG PO TABS
10.0000 mg | ORAL_TABLET | Freq: Every day | ORAL | Status: DC
Start: 2013-10-02 — End: 2013-10-08

## 2013-10-05 ENCOUNTER — Ambulatory Visit (HOSPITAL_COMMUNITY)
Admission: RE | Admit: 2013-10-05 | Discharge: 2013-10-05 | Disposition: A | Discharge status: Home / Self Care | Payer: Medicare Other | Source: Ambulatory Visit | Attending: Internal Medicine | Admitting: Internal Medicine

## 2013-10-05 VITALS — BP 110/66 | HR 92 | Wt 245.5 lb

## 2013-10-05 DIAGNOSIS — I2789 Other specified pulmonary heart diseases: Secondary | ICD-10-CM

## 2013-10-05 DIAGNOSIS — J441 Chronic obstructive pulmonary disease with (acute) exacerbation: Secondary | ICD-10-CM

## 2013-10-05 DIAGNOSIS — I2721 Secondary pulmonary arterial hypertension: Secondary | ICD-10-CM

## 2013-10-05 DIAGNOSIS — J449 Chronic obstructive pulmonary disease, unspecified: Secondary | ICD-10-CM | POA: Insufficient documentation

## 2013-10-05 DIAGNOSIS — J45901 Unspecified asthma with (acute) exacerbation: Secondary | ICD-10-CM

## 2013-10-05 DIAGNOSIS — I5032 Chronic diastolic (congestive) heart failure: Secondary | ICD-10-CM

## 2013-10-05 DIAGNOSIS — J4489 Other specified chronic obstructive pulmonary disease: Secondary | ICD-10-CM | POA: Insufficient documentation

## 2013-10-05 MED ORDER — POTASSIUM CHLORIDE CRYS ER 20 MEQ PO TBCR: EXTENDED_RELEASE_TABLET | ORAL | Status: DC

## 2013-10-05 MED ORDER — TORSEMIDE 20 MG PO TABS: ORAL_TABLET | ORAL | Status: DC

## 2013-10-05 MED ORDER — SPIRONOLACTONE 25 MG PO TABS: 25.0000 mg | ORAL_TABLET | Freq: Every day | ORAL | Status: DC

## 2013-10-05 NOTE — Progress Notes (Signed)
Patient ID: Alexis [name], female   DOB: 1956-05-05, 58 y.o.   MRN: [id number] PCP: Dr Frann Rider [place] [phone]  HPI Alexis [name] is a 58 year old female with history of obesity, severe COPD, mixed PAH and diastolic dysfunction.   Initial presentation was to [facility] in 2012 with acute on chronic respiratory failure. Based on the CT scan it was felt that the patient had severe pulmonary hypertension and was referred for diagnostic cardiac catheterization. Coronaries were normal. Her PA pressures and mean pulmonary artery pressure were markedly elevated. She was found to have severe obstructive/restrictive lung disease but with very little response to bronchodilator therapy. VQ and CT negative for PE. She was then subsequently referred to Dr. Koleen Nimrod. Her PH was felt to be out of proportion to he COPD and she was started on PDE-5 inhibitor and macitentan was added.  She has been intolerant of Ventavis due to cough. She was not able to qualify for Tyvaso.   Initial RHC April 02, 2011.  RA mean of 25 RV pressure is 117/13 PA 112/54 with mean of 74.  CO 5.8 liters. CI 3.3 liters.   PVR approximately 13 Woods units. Started on sildenafil (switched to tadalafil)  July 2012 pulmonary function test FVC 1.17 L per minute 37% of predicted, FEV1 1 L 40% of predicted. DLCO 61%  ECHO 06/2011 EF 60-65%. RV moderately dilated and mild to moderately hypokinetic with septal flattening RVSP 77 ECHO 10/02/12 Poor quality. EF 60-65% RV does appear better.   07/12/11 6MW at pulmonary rehab unchanged from August 2012. 900 feet. 04/26/12  6MW 870 feet O2 sats 93-94% on 2 liters [place]. 10/02/12 6MW 830 ft on 2L O2 with sats 87-90% 04/16/13            6MWQ 760 ft on 2L O2 sats 89-93% 10/06/13            6MW 650 ft, 198 m  10/06/12 [place] on adcirca and macitentan  RA = 15  RV = 91/15/20  PA = 90/41 (64)  PCW = 20  Fick cardiac output/index = 5.2/2.5  PVR = 8.4 Woods  FA sat = 95%  PA sat =  69%, 73%  High SVC sat = 71%  Follow up:  Remains on adcirca and macitentan. She has not problems at rest but ongoing exertional dyspnea. + Orthopnea sleeps on a wedge.  No presyncope.  Remains on 2- 3 liters [place]. O2 sats 92-95% with exertion 88-93%. Weight down 3 lbs since last appointment. She takes Metolazone about once every 2 weeks.  Following low salt diet and drinking less than 2 L day.  She stayed recently in [place] with her son.   She saw a pulmonologist at the [facility] in [place].  She had an extensive workup there.  CT chest (1/15) did not show interstitial lung disease.  PFTs (1/15) with FEV1 31%, FVC 33% (mixed obstruction/restriction).  Echo (1/15) showed EF 65%, mildly D-shaped interventricular septum, mildly dilated and dysfunctional RV, PA systolic pressure 72 mmHg.  They recommended that she continue macitentan and tadalafil and started her on Advair.  She was unable to qualify for Tyvaso.   Labs 7/14: BUN 15, Creatinine 0.77, K+ 4.7 Labs 04/16/13: K 3.4 Creatinine 1.0 BUN 24  Labs (11/14): K 4, creatinine 0.85 Labs (1/15): BNP 129, ANA neg, anti-SCL 70 neg, K 4.5, creatinine 0.9  Allergies  Allergen Reactions  . Bactrim     Face and eye swelling  . Morphine And Related Other (  See Comments)    Severe headache  . Penicillins     "doesn't work" got a lot as a child that doesn't work. But no systemic reaction.  . Sulfa Antibiotics Itching   Current Outpatient Prescriptions on File Prior to Encounter  Medication Sig Dispense Refill  . acetaminophen (TYLENOL) 500 MG tablet Take 1,000 mg by mouth every 6 (six) hours as needed. For pain      . aspirin 81 MG EC tablet Take 81 mg by mouth daily.        . Macitentan (OPSUMIT) 10 MG TABS Take 10 mg by mouth daily.  90 tablet  3  . metolazone (ZAROXOLYN) 2.5 MG tablet As needed      . omeprazole (PRILOSEC) 20 MG capsule Take 1 capsule (20 mg total) by mouth daily.  90 capsule  3  . potassium chloride SA (K-DUR,KLOR-CON)  20 MEQ tablet Take 3 tablets (60 mEq total) by mouth 2 (two) times daily.  180 tablet  3  . spironolactone (ALDACTONE) 25 MG tablet Take 1 tablet (25 mg total) by mouth daily.  30 tablet  30  . Tadalafil, PAH, (ADCIRCA) 20 MG TABS Take 2 tablets (40 mg total) by mouth daily.  180 tablet  3  . torsemide (DEMADEX) 20 MG tablet Take 2 tabs in AM and 1 tab in PM      . warfarin (COUMADIN) 5 MG tablet Take 7.5 mg daily except take 22m on Sunday  60 tablet  3   No current facility-administered medications on file prior to encounter.    Past Medical History  Diagnosis Date  . SOB (shortness of breath) on exertion   . Other chronic pulmonary heart diseases   . Chronic obstructive asthma with exacerbation     CT scan was a pattern but no evidence of pulmonary embolus and., Negative VQ scan July 2012 pulmonary function test FVC 1.17 L per minute 37% of predicted, FEV1 1 L 40% of predicted. DLCO 61% of predicted no response to bronchodilators negative HIV serology, ANA in scleroderma.  . Tobacco use disorder   . Obesity, unspecified   . Other and unspecified hyperlipidemia   . Cardiomegaly     Normal LV systolic function ejection fraction 55-60% with moderate decrease in RV function.  . Other diseases of lung, not elsewhere classified   . Hepatomegaly   . Body mass index 40.0-44.9, adult   . Edema   . Pulmonary arterial hypertension      PA pressure 112/54 mmHg the mean of 74 mmHg, right atrial pressure mean 25 mmHg. Pulmonary vascular resistance 13 with units. Cardiac output 5.8 L. Normal coronary arteries with normal LV function July 2012   . Obstructive sleep apnea     BiPAP initiated July 2012    There were no vitals filed for this visit.  PHYSICAL EXAM General: Chronically ill appearing No respiratory difficulty, on O2 per n/c. Husband present HEENT: normal  Neck: supple. Thick. Hard to assess due to body habitus but JVP does not appear elevated. Carotids 2+ bilat; no bruits. No  lymphadenopathy or thryomegaly appreciated.  Cor: PMI nonpalpable. Distant. Regular rate and rhythm. Loud P2. 2/6 TR Lungs: diminished throughout 2 liters [place] Abdomen: obese, +mild distention. no tenderness. Good bowel sounds.  Extremities: no cyanosis, clubbing, rash, trace edema.   Neuro: alert & oriented x 3, cranial nerves grossly intact. moves all 4 extremities w/o difficulty. Affect pleasant.  ASSESSMENT AND PLAN  1) PAH: Mixed PAH.  WHO group 3  component from COPD and OHS/OSA.  However, pulmonary HTN seems out of proportion to the degree of COPD, so suspect component of WHO group 1 PAH.  6 minute walk is worse than in the past.  Symptomatically, she feels about the same.  She was unable to qualify for Tyvaso.  I suspect the worsening may be due more to COPD, OHS/OSA.  She recently had an extensive workup at [facility] in [place] that showed similar test results to what has been done here. Echo this month in [place] showed mild RV dysfunction/dilation, no worse than prior and possibly better but unable to compare. - Continue current macitentan and Adcirca current regimen. - Continue continuous O2 - Agree with starting Advair and would have her followup with pulmonary here, will arrange appointment with Dr. Lamonte Sakai.  - She needs a repeat sleep study for adjustment of CPAP.   2) Chronic diastolic HF:  Weight stable. NYHA III symptoms. Volume status stable. - continue current dose of torsemide and metolazone as needed. -Check BMET   3) COPD on home O2: continuous 2 liters [place]   4) Morbid obesity: Discussed trying to lose some weight.  Loralie Champagne 10/07/2013

## 2013-10-05 NOTE — Patient Instructions (Signed)
Referral to [facility] with Dr. Gwenette Greet February 3rd at 11:00 am.

## 2013-10-05 NOTE — Progress Notes (Signed)
6 minute ambulation completed during AHF clinic appointment on 2L continuous O2 and using 3-in-1 RW.  Patient tolerated fairly well, quickly SOB.    Starting O2 on 2L continuous 94%, HR 100. Ambulated 64f. Required 1 30-45 sec rest break after 3023fO2 sat 97%, HR112. Ending O2 sat 94%, Hr 118.

## 2013-10-08 ENCOUNTER — Other Ambulatory Visit (HOSPITAL_COMMUNITY): Payer: Self-pay

## 2013-10-08 MED ORDER — MACITENTAN 10 MG PO TABS: 10.0000 mg | ORAL_TABLET | Freq: Every day | ORAL | Status: DC

## 2013-10-09 ENCOUNTER — Ambulatory Visit (INDEPENDENT_AMBULATORY_CARE_PROVIDER_SITE_OTHER): Payer: Medicare Other | Admitting: Pulmonary Disease

## 2013-10-09 ENCOUNTER — Encounter: Payer: Self-pay | Admitting: Pulmonary Disease

## 2013-10-09 ENCOUNTER — Other Ambulatory Visit: Payer: Medicare Other

## 2013-10-09 VITALS — BP 130/78 | HR 91 | Temp 97.8°F | Ht 62.5 in | Wt 245.8 lb

## 2013-10-09 DIAGNOSIS — J438 Other emphysema: Secondary | ICD-10-CM

## 2013-10-09 DIAGNOSIS — G4733 Obstructive sleep apnea (adult) (pediatric): Secondary | ICD-10-CM

## 2013-10-09 DIAGNOSIS — E662 Morbid (severe) obesity with alveolar hypoventilation: Secondary | ICD-10-CM | POA: Insufficient documentation

## 2013-10-09 DIAGNOSIS — J439 Emphysema, unspecified: Secondary | ICD-10-CM

## 2013-10-09 DIAGNOSIS — I2789 Other specified pulmonary heart diseases: Secondary | ICD-10-CM

## 2013-10-09 DIAGNOSIS — I2721 Secondary pulmonary arterial hypertension: Secondary | ICD-10-CM

## 2013-10-09 NOTE — Assessment & Plan Note (Signed)
The patient has no airflow obstruction by her FEV1 percent, but has significant air trapping on her lung volumes along with mosaic attenuation on her high-resolution CT. The CT report did not mention significant emphysema. It is unclear how much of this is related to emphysema, and how much may be related to chronic obstructive asthma. Alexis [name] add a long-acting anticholinergic to her regimen, since this is known to improve air trapping significantly. I have also stressed to her the importance of staying away from smoking.

## 2013-10-09 NOTE — Assessment & Plan Note (Signed)
I suspect this is one of the patient's major issues, and is most likely responsible for her hypercarbia much more so than her mild obstructive disease. I have stressed to her the importance of aggressive weight loss in her overall treatment strategy, as well as maintaining her nocturnal ventilation with bilevel.

## 2013-10-09 NOTE — Progress Notes (Signed)
Subjective:    Patient ID: Alexis [name], female    DOB: 01-Dec-1955, 58 y.o.   MRN: [id number]  HPI The patient is a 58 year old female who I've been asked to see for obstructive sleep apnea and also COPD. She carries a diagnosis of pulmonary arterial hypertension, and has been on multiple medications over the years. She has recently had he second evaluation at the [facility] in [place] in January of this year, with multiple issues being found. She has had a CT of her chest that showed only mosaic attenuation that is probably secondary to air trapping. There was no evidence for interstitial lung disease. She underwent a nuclear medicine study that showed low probability, and nothing to indicate chronic thromboembolic disease. She has had pulmonary function studies that showed an FEV1 that was 31% of predicted, however her ratio was completely normal. Her lung volumes showed minimal restriction, and she had significant air trapping. Her diffusion capacity surprisingly was normal at 80% of predicted. She has had a 6 minute walk of only 221 m. The patient is on chronic oxygen 24 hours a day, and also has been started on Advair for her underlying obstructive disease. She has a long history of smoking, but has not done so since 2012. The patient was diagnosed with sleep apnea in 2008, and has been on CPAP at 12 cm since 2012. She has had a negative autoimmune evaluation both in 2012 and 2015. She was noted to have chronic hypercarbia in the 50s on arterial blood gases in 2012. The patient states that she has severe dyspnea on exertion even with her activities of daily living. She denies any significant cough or mucus production. She has had intermittent lower extremity edema.     Review of Systems  Constitutional: Negative for fever and unexpected weight change.  HENT: Positive for ear pain and sneezing. Negative for congestion, dental problem, nosebleeds, postnasal drip, rhinorrhea,  sinus pressure, sore throat and trouble swallowing.   Eyes: Negative for redness and itching.  Respiratory: Positive for shortness of breath. Negative for cough, chest tightness and wheezing.   Cardiovascular: Negative for palpitations and leg swelling.  Gastrointestinal: Positive for abdominal pain. Negative for nausea and vomiting.  Genitourinary: Negative for dysuria.  Musculoskeletal: Negative for joint swelling.  Skin: Negative for rash.  Neurological: Positive for headaches.  Hematological: Does not bruise/bleed easily.  Psychiatric/Behavioral: Negative for dysphoric mood. The patient is nervous/anxious.        Objective:   Physical Exam Constitutional:  Morbidly obese female, no acute distress  HENT:  Nares patent without discharge  Oropharynx without exudate, palate and uvula are elongated.   Eyes:  Perrla, eomi, no scleral icterus  Neck:  No JVD, no TMG  Cardiovascular:  Normal rate, regular rhythm, no rubs or gallops.  No murmurs        Intact distal pulses but diminished.   Pulmonary :  decreased breath sounds, no stridor or respiratory distress   No rales, rhonchi, or wheezing  Abdominal:  Soft, nondistended, bowel sounds present.  No tenderness noted.   Musculoskeletal:  mild lower extremity edema noted.  Lymph Nodes:  No cervical lymphadenopathy noted  Skin:  No cyanosis noted  Neurologic:  Alert, appropriate, moves all 4 extremities without obvious deficit.         Assessment & Plan:

## 2013-10-09 NOTE — Patient Instructions (Addendum)
Will check bloodwork today for the inherited form of emphysema Stay on advair twice a day everyday, and will add spiriva respimat 2 inhalations each am only.  Will schedule for sleep study to re-optimize your pressures on cpap.  Will recheck your oxygen levels once your pressure is adjusted. Work aggressively on weight loss Will arrange followup once sleep study results are available.  Please bring your cpap device WITH SD card with you to the visit.

## 2013-10-09 NOTE — Assessment & Plan Note (Signed)
The patient has multifactorial, very arterial hypertension, and is on vasodilators for this. She will also need aggressive treatment for her various contributing problems such as her sleep apnea, obesity hypoventilation, and also her mild COPD with significant air trapping by PFTs and high-resolution CT.

## 2013-10-09 NOTE — Assessment & Plan Note (Signed)
The patient tells me that she is on bilevel for her obstructive sleep apnea, but she has not had her pressure adjusted in 3 years. I think it will be very important to completely control her sleep-disordered breathing, and to maintain adequate oxygen saturations during sleep. Will schedule for a titration study to reestablish her diagnosis of sleep apnea, and also to optimize her pressure. We can then do an overnight oximetry to insure that we are maintaining adequate saturations.

## 2013-10-11 ENCOUNTER — Ambulatory Visit (HOSPITAL_BASED_OUTPATIENT_CLINIC_OR_DEPARTMENT_OTHER): Payer: Medicare Other | Attending: Pulmonary Disease | Admitting: Radiology

## 2013-10-11 VITALS — Ht 62.5 in | Wt 245.0 lb

## 2013-10-11 DIAGNOSIS — I4949 Other premature depolarization: Secondary | ICD-10-CM | POA: Insufficient documentation

## 2013-10-11 DIAGNOSIS — G4761 Periodic limb movement disorder: Secondary | ICD-10-CM | POA: Insufficient documentation

## 2013-10-11 DIAGNOSIS — Z9989 Dependence on other enabling machines and devices: Secondary | ICD-10-CM

## 2013-10-11 DIAGNOSIS — G4733 Obstructive sleep apnea (adult) (pediatric): Secondary | ICD-10-CM

## 2013-10-18 ENCOUNTER — Telehealth: Payer: Self-pay | Admitting: Pulmonary Disease

## 2013-10-18 DIAGNOSIS — G4733 Obstructive sleep apnea (adult) (pediatric): Secondary | ICD-10-CM

## 2013-10-18 LAB — ALPHA-1 ANTITRYPSIN PHENOTYPE: A1 ANTITRYPSIN: 208 mg/dL — AB (ref 83–199)

## 2013-10-18 NOTE — Telephone Encounter (Signed)
Needs ov to review sleep study

## 2013-10-19 NOTE — Procedures (Signed)
NAMEBRAYLIN, Alexis [name]              ACCOUNT NO.:  [id number]  MEDICAL RECORD NO.:  [id number]          PATIENT TYPE:  OUT  LOCATION:  SLEEP CENTER                 FACILITY:  [facility]  PHYSICIAN:  Kathee Delton, MD,FCCPDATE OF BIRTH:  09/14/1953  DATE OF STUDY:  10/11/2013                           NOCTURNAL POLYSOMNOGRAM  REFERRING PHYSICIAN:  Kathee Delton, MD,FCCP  LOCATION:  Sleep Lab.  INDICATION FOR STUDY:  Hypersomnia with sleep apnea.  EPWORTH SLEEPINESS SCORE:  7.  MEDICATIONS:  SLEEP ARCHITECTURE:  The patient had total sleep time of 263 minutes with no slow-wave sleep and only 10 minutes of REM.  Sleep onset latency was minimally prolonged at 34 minutes, and REM onset was very prolonged at 340 minutes.  Sleep efficiency was poor at 66%.  RESPIRATORY DATA:  The patient was found to have 19 apneas and 1 obstructive hypopnea, giving her an AHI of only 5 events per hour. However, she was also noted to have 196 RERAs, giving her an RDI of 49 events per hour.  The events occurred in the nonsupine position, and there was moderate snoring noted throughout.  The patient did not meet split-night criteria secondary to the majority of her events occurring well after 2 a.m.  OXYGEN DATA:  There was O2 desaturation as low as 91%.  It should be noted the patient wore 2 liters of nasal cannula during the study as she does at home during sleep.  CARDIAC DATA:  Occasional PVC noted.  MOVEMENT-PARASOMNIA:  The patient had 171 limb movements with 13 per hour resulting in arousal or awakening.  There were no abnormal behaviors seen.  IMPRESSIONS-RECOMMENDATIONS: 1. Mild obstructive sleep apnea/hypopnea syndrome, with an AHI of 5     events per hour and a RDI of 49 events per hour with oxygen     desaturation as low as 91%.  The patient was wearing oxygen at 2     liters during the night.  Treatment for this degree of sleep apnea     can include a trial of weight loss alone, upper  airway surgery,     dental appliance, and also a CPAP.  Clinical correlation is     suggested. 2. Occasional premature ventricular contraction noted. 3. Large numbers of periodic limb movements with what appears to be     significant sleep disruption.  It is unclear whether this is     related to the patient's sleep disordered breathing, or she may     have a concomitant primary     movement disorder of sleep.  Clinical correlation is suggested once     she has adequate treatment of her sleep-disordered breathing.     Kathee Delton, MD,FCCP Diplomate, [place] Board of Sleep Medicine    [facility]  D:  10/18/2013 15:57:12  T:  10/19/2013 05:24:49  Job:  [id number]

## 2013-10-22 NOTE — Telephone Encounter (Signed)
PT SCHEDULED FOR APPT WITH [facility] 2/24 @ 430

## 2013-10-24 ENCOUNTER — Telehealth: Payer: Self-pay | Admitting: Pulmonary Disease

## 2013-10-24 NOTE — Progress Notes (Signed)
Quick Note:  Pt aware of lab results per [place]. She verbalized understanding and voiced no further questions or concerns at this time. ______

## 2013-10-24 NOTE — Telephone Encounter (Signed)
Alexis [name] was calling pt to let her know:  Notes Recorded by Kathee Delton, MD on 10/24/2013 at 11:44 AM Let pt know that her blood test for hereditary emphysema is normal.  -----  Called, spoke with pt.  Informed her of above results per [facility].  She verbalized understanding and voiced no further questions or concerns at this time.

## 2013-10-30 ENCOUNTER — Telehealth: Payer: Self-pay | Admitting: Pulmonary Disease

## 2013-10-30 ENCOUNTER — Ambulatory Visit: Payer: Medicare Other | Admitting: Pulmonary Disease

## 2013-10-30 NOTE — Telephone Encounter (Signed)
appt has been rescheduled to 11/07/13 _0 :30.  Nothing further needed.

## 2013-11-07 ENCOUNTER — Ambulatory Visit (INDEPENDENT_AMBULATORY_CARE_PROVIDER_SITE_OTHER): Payer: Medicare Other | Admitting: Pulmonary Disease

## 2013-11-07 ENCOUNTER — Encounter: Payer: Self-pay | Admitting: Pulmonary Disease

## 2013-11-07 VITALS — BP 124/76 | HR 104 | Temp 98.1°F | Ht 62.5 in | Wt 247.0 lb

## 2013-11-07 DIAGNOSIS — G4733 Obstructive sleep apnea (adult) (pediatric): Secondary | ICD-10-CM

## 2013-11-07 DIAGNOSIS — J439 Emphysema, unspecified: Secondary | ICD-10-CM

## 2013-11-07 DIAGNOSIS — J438 Other emphysema: Secondary | ICD-10-CM

## 2013-11-07 MED ORDER — FLUTICASONE-SALMETEROL 250-50 MCG/DOSE IN AEPB: 1.0000 | INHALATION_SPRAY | Freq: Two times a day (BID) | RESPIRATORY_TRACT | Status: DC

## 2013-11-07 MED ORDER — TIOTROPIUM BROMIDE MONOHYDRATE 2.5 MCG/ACT IN AERS: 2.0000 | INHALATION_SPRAY | Freq: Every day | RESPIRATORY_TRACT | Status: DC

## 2013-11-07 NOTE — Assessment & Plan Note (Signed)
The patient has had a recent sleep study where she did not meet criteria for split-night. However, we did re-document mild obstructive sleep apnea.  Her download today shows that she is on an auto CPAP, and she has been noncompliant. She blames this on a poorly fitting mask, and we'll therefore arrange for a fitting at the sleep Center. Once this is done, we will recheck a download to insure that we are adequately controlling her sleep disordered breathing.

## 2013-11-07 NOTE — Assessment & Plan Note (Signed)
The patient feels that Spiriva may have improved her breathing has had on therapy to Advair. I think we need to continue her for another month or so and see how she responds. I've also stressed to her the importance of a conditioning program and weight loss, and she is going to go to the [facility] for this. She was not happy with the pulmonary rehabilitation program in [place].

## 2013-11-07 NOTE — Progress Notes (Signed)
   Subjective:    Patient ID: Alexis [name], female    DOB: 19-Nov-1955, 58 y.o.   MRN: [id number]  HPI The patient comes in today for followup of her obstructive sleep apnea, and also multifactorial respiratory failure. At the last visit, Spiriva was added to her Advair to see if this improved her breathing. She did see some improvement, and would like to continue with this. She was also scheduled for a split night sleep study, but did not meet split-night criteria. We did reestablish that she indeed continues to have sleep apnea. The patient brought in her machine today where we can do a download, and unfortunately she has been noncompliant because of a poorly fitting mask.   Review of Systems  Constitutional: Positive for diaphoresis ( night sweats and hot flashes). Negative for fever and unexpected weight change.  HENT: Positive for congestion, nosebleeds and rhinorrhea. Negative for dental problem, ear pain, postnasal drip, sinus pressure, sneezing, sore throat and trouble swallowing.   Eyes: Negative for redness and itching.  Respiratory: Negative for cough, chest tightness, shortness of breath and wheezing.   Cardiovascular: Negative for palpitations and leg swelling.  Gastrointestinal: Negative for nausea and vomiting.  Genitourinary: Negative for dysuria.  Musculoskeletal: Negative for joint swelling.  Skin: Negative for rash.  Neurological: Positive for headaches.  Hematological: Does not bruise/bleed easily.  Psychiatric/Behavioral: Negative for dysphoric mood. The patient is not nervous/anxious.        Objective:   Physical Exam Morbidly obese female in no acute distress Nose without purulence or discharge noted No skin breakdown or pressure necrosis from the CPAP mask Neck without lymphadenopathy or thyromegaly Lower extremities with edema noted Alert and oriented, moves all 4 extremities.       Assessment & Plan:

## 2013-11-07 NOTE — Patient Instructions (Signed)
Will arrange for a mask fitting session over at the sleep center.  Once you get a mask that fit well, let us know and we can arrange for a 4 week download off your machine to make sure that we are adequately treating your sleep apnea Continue with advair and spiriva, and will send a prescription to the pharmacy Work on weight loss and conditioning program. followup with me again in 36mo.

## 2013-11-13 ENCOUNTER — Ambulatory Visit (HOSPITAL_BASED_OUTPATIENT_CLINIC_OR_DEPARTMENT_OTHER): Payer: Medicare Other | Attending: Pulmonary Disease | Admitting: Radiology

## 2013-11-13 DIAGNOSIS — G4733 Obstructive sleep apnea (adult) (pediatric): Secondary | ICD-10-CM

## 2013-11-13 DIAGNOSIS — Z9989 Dependence on other enabling machines and devices: Principal | ICD-10-CM

## 2013-11-16 ENCOUNTER — Telehealth: Payer: Self-pay | Admitting: Pulmonary Disease

## 2013-11-16 NOTE — Telephone Encounter (Signed)
Called spoke w/ pt. She went for mask fitting Tuesday. Was told [facility] sent over the paperwork on what type mask she needs. We haven't sent order yet. She wants to pick up RX and take to [facility]. [place] has signed RX and placed for pick u;p.

## 2013-11-16 NOTE — Telephone Encounter (Signed)
Pt is across the street & would like to speak w/ someone since she lives 50 miles from here.  Please help ASAP.

## 2013-11-19 ENCOUNTER — Encounter (HOSPITAL_COMMUNITY): Payer: Self-pay

## 2013-11-19 ENCOUNTER — Telehealth (HOSPITAL_COMMUNITY): Payer: Self-pay

## 2013-11-19 NOTE — Progress Notes (Signed)
I have called and left a message with Raenette to inquire about participation in Pulmonary Rehab. Will send letter in mail and follow up.

## 2013-11-19 NOTE — Telephone Encounter (Signed)
Patient states that she lives in [place] and it is too far to travel to [place] for Pulmonary Rehab.

## 2013-12-18 ENCOUNTER — Telehealth (HOSPITAL_COMMUNITY): Payer: Self-pay | Admitting: Cardiology

## 2013-12-18 NOTE — Telephone Encounter (Signed)
ERRONEOUS ENCOUNTER

## 2013-12-24 ENCOUNTER — Telehealth: Payer: Self-pay | Admitting: Pulmonary Disease

## 2013-12-24 NOTE — Telephone Encounter (Signed)
ashtyn please advise if you have these forms from [facility] about the pts sleep mask. thanks

## 2013-12-25 NOTE — Telephone Encounter (Signed)
Pt aware that this in Dr Gwenette Greet [facility] folder, once signed we will fax this back.  Pt just wanted to make sure that we received this from [facility].   Nothing further needed.

## 2014-01-21 ENCOUNTER — Encounter (HOSPITAL_COMMUNITY): Payer: Self-pay

## 2014-01-21 ENCOUNTER — Ambulatory Visit (HOSPITAL_COMMUNITY)
Admission: RE | Admit: 2014-01-21 | Discharge: 2014-01-21 | Disposition: A | Discharge status: Home / Self Care | Payer: Medicare Other | Source: Ambulatory Visit | Attending: Pediatrics | Admitting: Pediatrics

## 2014-01-21 VITALS — BP 112/60 | HR 94 | Wt 246.4 lb

## 2014-01-21 DIAGNOSIS — Z7901 Long term (current) use of anticoagulants: Secondary | ICD-10-CM | POA: Insufficient documentation

## 2014-01-21 DIAGNOSIS — I509 Heart failure, unspecified: Secondary | ICD-10-CM | POA: Insufficient documentation

## 2014-01-21 DIAGNOSIS — I2789 Other specified pulmonary heart diseases: Secondary | ICD-10-CM | POA: Insufficient documentation

## 2014-01-21 DIAGNOSIS — J449 Chronic obstructive pulmonary disease, unspecified: Secondary | ICD-10-CM | POA: Insufficient documentation

## 2014-01-21 DIAGNOSIS — Z6841 Body Mass Index (BMI) 40.0 and over, adult: Secondary | ICD-10-CM | POA: Insufficient documentation

## 2014-01-21 DIAGNOSIS — E785 Hyperlipidemia, unspecified: Secondary | ICD-10-CM | POA: Insufficient documentation

## 2014-01-21 DIAGNOSIS — I5032 Chronic diastolic (congestive) heart failure: Secondary | ICD-10-CM | POA: Insufficient documentation

## 2014-01-21 DIAGNOSIS — J4489 Other specified chronic obstructive pulmonary disease: Secondary | ICD-10-CM | POA: Insufficient documentation

## 2014-01-21 DIAGNOSIS — I517 Cardiomegaly: Secondary | ICD-10-CM | POA: Insufficient documentation

## 2014-01-21 DIAGNOSIS — Z7982 Long term (current) use of aspirin: Secondary | ICD-10-CM | POA: Insufficient documentation

## 2014-01-21 DIAGNOSIS — I2721 Secondary pulmonary arterial hypertension: Secondary | ICD-10-CM

## 2014-01-21 DIAGNOSIS — G4733 Obstructive sleep apnea (adult) (pediatric): Secondary | ICD-10-CM | POA: Insufficient documentation

## 2014-01-21 DIAGNOSIS — Z9981 Dependence on supplemental oxygen: Secondary | ICD-10-CM | POA: Insufficient documentation

## 2014-01-21 DIAGNOSIS — E669 Obesity, unspecified: Secondary | ICD-10-CM | POA: Insufficient documentation

## 2014-01-21 NOTE — Progress Notes (Signed)
6 min walk test complete, pt ambulated 600 ft (182.9 m) O2 sat ranged from 91-88% on 2 L oxygen, HR ranged from 101-121, after walk pt recovered back to baseline 91% on 2L and HR 112  .

## 2014-01-21 NOTE — Patient Instructions (Signed)
Your physician recommends that you schedule a follow-up appointment in: 4 months  Do the following things EVERYDAY: 1) Weigh yourself in the morning before breakfast. Write it down and keep it in a log. 2) Take your medicines as prescribed 3) Eat low salt foods-Limit salt (sodium) to 2000 mg per day.  4) Stay as active as you can everyday 5) Limit all fluids for the day to less than 2 liters 6)

## 2014-01-21 NOTE — Progress Notes (Addendum)
Patient ID: Alexis [name], female   DOB: 1956/08/04, 57 y.o.   MRN: [id number] PCP: Dr Frann Rider [place] [phone]  HPI Alexis [name] is a 58 year old female with history of obesity, severe COPD, mixed PAH and diastolic dysfunction.   Initial presentation was to [facility] in 2012 with acute on chronic respiratory failure. Based on the CT scan it was felt that the patient had severe pulmonary hypertension and was referred for diagnostic cardiac catheterization. Coronaries were normal. Her PA pressures and mean pulmonary artery pressure were markedly elevated. She was found to have severe obstructive/restrictive lung disease but with very little response to bronchodilator therapy. VQ and CT negative for PE. She was then subsequently referred to Dr. Koleen Nimrod. Her PH was felt to be out of proportion to he COPD and she was started on PDE-5 inhibitor and macitentan was added, She has been intolerant of Ventavis due to cough.   Initial RHC April 02, 2011.  RA mean of 25 RV pressure is 117/13 PA 112/54 with mean of 74.  CO 5.8 liters. CI 3.3 liters.   PVR approximately 13 [place] units. Started on sildenafil (switched to tadalafil)  July 2012 pulmonary function test FVC 1.17 L per minute 37% of predicted, FEV1 1 L 40% of predicted. DLCO 61%  ECHO 06/2011 EF 60-65%. RV moderately dilated and mild to moderately hypokinetic with septal flattening RVSP 77 ECHO 10/02/12 Poor quality. EF 60-65% RV does appear better.  ECHO 1/15 EF 65% RV mildly dilated/HK D-shaped septum RVSP 72  07/12/11 6MW at pulmonary rehab unchanged from August 2012. 900 feet. 04/26/12  6MW 870 feet O2 sats 93-94% on 2 liters [place]. 10/02/12 6MW 830 ft on 2L O2 with sats 87-90% 04/16/13            6MW 760 ft on 2L O2 sats 89-93% 01/21/14            6MW 600 ft on 2L O2 sats 88-91%   10/06/12 [place] on adcirca and macitentan  RA = 15  RV = 91/15/20  PA = 90/41 (64)  PCW = 20  Fick cardiac output/index = 5.2/2.5  PVR = 8.4  [place]  FA sat = 95%  PA sat = 69%, 73%  High SVC sat = 71%  Labs 7/14: BUN 15, Creatinine 0.77, K+ 4.7 Labs 04/16/13: K 3.4 Creatinine 1.0 BUN 24   She returns for follow up. We have been working on getting her Tyvaso but she has been refused.. Remains on adcirca and macitentan. Says her breathing is relatively stable. Class II-III exertional dyspnea. Saw Dr. Gwenette Greet and was switched to Spiriva which didn't help much. CPAP auto-download showed non-compliance. Mask re-fitted. No presyncope.  Remains on 2- 3 liters [place]. O2 sats 92-95% with exertion 88-93%. Weight at home 245-250 pounds - only weighs herself a couple times per month. She takes Metolazone about once every month..    Allergies  Allergen Reactions  . Bactrim     Face and eye swelling  . Morphine And Related Other (See Comments)    Severe headache  . Penicillins     "doesn't work" got a lot as a child that doesn't work. But no systemic reaction.  . Sulfa Antibiotics Itching   Current Outpatient Prescriptions on File Prior to Encounter  Medication Sig Dispense Refill  . acetaminophen (TYLENOL) 500 MG tablet Take 1,000 mg by mouth every 6 (six) hours as needed. For pain      . aspirin 81 MG EC tablet Take 81  mg by mouth daily.        . Fluticasone-Salmeterol (ADVAIR DISKUS) 250-50 MCG/DOSE AEPB Inhale 1 puff into the lungs 2 (two) times daily.  60 each  6  . Macitentan (OPSUMIT) 10 MG TABS Take 10 mg by mouth daily.  90 tablet  3  . metolazone (ZAROXOLYN) 2.5 MG tablet As needed      . potassium chloride SA (K-DUR,KLOR-CON) 20 MEQ tablet 2 tabs in AM and 1 tab in PM  270 tablet  3  . spironolactone (ALDACTONE) 25 MG tablet Take 1 tablet (25 mg total) by mouth daily.  90 tablet  3  . Tadalafil, PAH, (ADCIRCA) 20 MG TABS Take 2 tablets (40 mg total) by mouth daily.  180 tablet  3  . Tiotropium Bromide Monohydrate (SPIRIVA RESPIMAT) 2.5 MCG/ACT AERS Inhale 2 puffs into the lungs daily.  1 Inhaler  6  . torsemide (DEMADEX) 20 MG  tablet Take 2 tabs in AM and 1 tab in PM  270 tablet  3  . warfarin (COUMADIN) 5 MG tablet Take 7.5 mg daily except take 20m on Sunday  60 tablet  3   No current facility-administered medications on file prior to encounter.    Past Medical History  Diagnosis Date  . SOB (shortness of breath) on exertion   . Other chronic pulmonary heart diseases   . Chronic obstructive asthma with exacerbation     CT scan was a pattern but no evidence of pulmonary embolus and., Negative VQ scan July 2012 pulmonary function test FVC 1.17 L per minute 37% of predicted, FEV1 1 L 40% of predicted. DLCO 61% of predicted no response to bronchodilators negative HIV serology, ANA in scleroderma.  . Tobacco use disorder   . Obesity, unspecified   . Other and unspecified hyperlipidemia   . Cardiomegaly     Normal LV systolic function ejection fraction 55-60% with moderate decrease in RV function.  . Other diseases of lung, not elsewhere classified   . Hepatomegaly   . Body mass index 40.0-44.9, adult   . Edema   . Pulmonary arterial hypertension      PA pressure 112/54 mmHg the mean of 74 mmHg, right atrial pressure mean 25 mmHg. Pulmonary vascular resistance 13 with units. Cardiac output 5.8 L. Normal coronary arteries with normal LV function July 2012   . Obstructive sleep apnea     BiPAP initiated July 2012   PHYSICAL EXAM Filed Vitals:   01/21/14 1149  BP: 112/60  Pulse: 94  Weight: 246 lb 6.4 oz (111.766 kg)  SpO2: 93%   General: Chronically ill appearing No respiratory difficulty, on O2 per n/c. Husband present HEENT: normal  Neck: supple. Thick. Hard to assess due to body habitus but JVP does not appear elelvated. Carotids 2+ bilat; no bruits. No lymphadenopathy or thryomegaly appreciated.  Cor: PMI nonpalpable. Distant. Regular rate and rhythm. increased P2. Soft TR no RV lift Lungs: diminished throughout 2 liters [place] Abdomen: obese, +mild distention. Mildly tender LUQ. Good bowel sounds.   Extremities: no cyanosis, clubbing, rash, trace edema.   Neuro: alert & oriented x 3, cranial nerves grossly intact. moves all 4 extremities w/o difficulty. Affect pleasant.  ASSESSMENT AND PLAN  1) PAH - Continue macitentan and Adcirca - Stable Class III. Echo stable. - Has been refused Tyvaso - Continue O2 and CPAP. - Will repeat 6MW today - Continue current regimen. May be candidate for selexapeg down the road  2) Chronic diastolic HF - Weight stable. NYHA  III symptoms. Volume status good. - continue current dose of torsemide and weekly metolazone - encouraged her to me more compliant with daily weights and sliding scale diuretics.  3) COPD on home O2 - continuous 2 liters [place]    Daniel R Bensimhon,MD 12:32 PM  Addendum: 6MW test today with distance of 600 feet which is significant decline from previous. Will discuss repeating RHC in next few weeks.   Shaune Pascal Bensimhon,MD 3:13 PM

## 2014-01-21 NOTE — Addendum Note (Signed)
Encounter addended by: Kerry Dory, CMA on: 01/21/2014  1:05 PM<BR>     Documentation filed: Patient Instructions Section, Notes Section

## 2014-01-21 NOTE — Addendum Note (Signed)
Encounter addended by: Jolaine Artist, MD on: 01/21/2014  3:14 PM<BR>     Documentation filed: Notes Section

## 2014-03-07 ENCOUNTER — Telehealth: Payer: Self-pay | Admitting: Pulmonary Disease

## 2014-03-07 NOTE — Telephone Encounter (Signed)
The number listed on message is incorrect >> Melissa @ [phone]  Called spoke with Lenna Sciara who reported that pt changed insurances to Medicare at the beginning of 2015.  At pt's upcoming appt with [facility] on 7.7.15 @ 3pm pt will need to be requalified with a new order for Medicare documentation purposes.    **pt does see [facility] for both pulmonary and sleep**  Will sign and forward to [place] as FYI.

## 2014-03-12 ENCOUNTER — Ambulatory Visit (INDEPENDENT_AMBULATORY_CARE_PROVIDER_SITE_OTHER): Payer: Medicare Other | Admitting: Pulmonary Disease

## 2014-03-12 ENCOUNTER — Encounter: Payer: Self-pay | Admitting: Pulmonary Disease

## 2014-03-12 VITALS — BP 118/70 | HR 91 | Temp 97.7°F | Ht 62.5 in | Wt 251.6 lb

## 2014-03-12 DIAGNOSIS — J438 Other emphysema: Secondary | ICD-10-CM

## 2014-03-12 DIAGNOSIS — J9610 Chronic respiratory failure, unspecified whether with hypoxia or hypercapnia: Secondary | ICD-10-CM | POA: Insufficient documentation

## 2014-03-12 DIAGNOSIS — G4733 Obstructive sleep apnea (adult) (pediatric): Secondary | ICD-10-CM

## 2014-03-12 DIAGNOSIS — J9611 Chronic respiratory failure with hypoxia: Secondary | ICD-10-CM

## 2014-03-12 DIAGNOSIS — R0902 Hypoxemia: Secondary | ICD-10-CM

## 2014-03-12 DIAGNOSIS — E662 Morbid (severe) obesity with alveolar hypoventilation: Secondary | ICD-10-CM

## 2014-03-12 NOTE — Assessment & Plan Note (Signed)
I have stressed to the patient the importance of aggressive weight loss.

## 2014-03-12 NOTE — Assessment & Plan Note (Signed)
The pt has very mild copd by pfts, and did not see a difference with spiriva added to her regimen.  I've asked her to continue on Advair, but reminded her this is not really contributing a lot to her underlying dyspnea.

## 2014-03-12 NOTE — Progress Notes (Signed)
   Subjective:    Patient ID: Alexis [name], female    DOB: [birth year], 58 y.o.   MRN: [id number]  HPI The patient comes in today for followup of her multifactorial chronic respiratory failure. She has pulmonary hypertension that is being followed by cardiology, as well as obesity hypoventilation syndrome and obstructive sleep apnea. She has very mild COPD by PFTs. At the last visit, Spiriva was added to her Advair to see if this would help her breathing, but she did not feel that it was helpful. She is having increased shortness of breath from the last visit with exertional activities. She is continuing on CPAP, and it least has found a mask that fits well for her. Unfortunately, she did not bring her device as I asked from the last visit so that we can get a download. This will be done through her home care company, and then we will have to check overnight oximetry to verify that she has adequate oxygenation during sleep. Of note, the patient's weight has increased further since the last visit.   Review of Systems  Constitutional: Negative for fever and unexpected weight change.  HENT: Negative for congestion, dental problem, ear pain, nosebleeds, postnasal drip, rhinorrhea, sinus pressure, sneezing, sore throat and trouble swallowing.   Eyes: Negative for redness and itching.  Respiratory: Positive for shortness of breath. Negative for cough, chest tightness and wheezing.   Cardiovascular: Negative for palpitations and leg swelling.  Gastrointestinal: Negative for nausea and vomiting.  Genitourinary: Negative for dysuria.  Musculoskeletal: Negative for joint swelling.  Skin: Negative for rash.  Neurological: Negative for headaches.  Hematological: Does not bruise/bleed easily.  Psychiatric/Behavioral: Negative for dysphoric mood. The patient is not nervous/anxious.        Objective:   Physical Exam Obese female in no acute distress Nose without purulence or discharge noted No  skin breakdown or pressure necrosis from the CPAP mask Neck without lymphadenopathy or thyromegaly Chest with minimal basilar crackles, but very poor depth of inspiration. No wheezing noted. Cardiac exam with regular rate and rhythm Lower extremities with minimal edema, no cyanosis Alert and oriented, moves all 4 extremities.       Assessment & Plan:

## 2014-03-12 NOTE — Patient Instructions (Signed)
Stay on advair for your mild copd Work aggressively on weight loss We need to get a download off your machine to make sure your pressure is adequate.  Once we establish that your sleep apnea is well controlled, will then need to check oxygen levels overnight to make sure they are adequate.  Will see if you qualify for a portable concentrator thru your home care company followup with me again in 41mo.

## 2014-03-12 NOTE — Assessment & Plan Note (Signed)
The patient unfortunately did not bring her device to the office visit as I had asked her at the last meeting.  We still have no idea if her current pressure is adequately treating her sleep apnea.  We'll need to get a download off her device to evaluate for this, and then check overnight oximetry to make sure that we are providing adequate oxygenation.

## 2014-03-18 ENCOUNTER — Telehealth: Payer: Self-pay | Admitting: Pulmonary Disease

## 2014-03-18 DIAGNOSIS — J438 Other emphysema: Secondary | ICD-10-CM

## 2014-03-18 NOTE — Telephone Encounter (Signed)
Spoke with patient -- states that she received a call from [facility] Per [place] they do not carry the small light weight POC that Dr Gwenette Greet requested. Per patient she was advised that their POC weighs 10lbs and is a "pull along" concentrator  Pt is requesting another order to be sent to a DME that carries the light weight he needs.   Please advise Dr Gwenette Greet, thanks.

## 2014-03-18 NOTE — Telephone Encounter (Signed)
Order has been placed to [facility] will forward to them so they are aware.  Pt is aware of this. Nothing further needed

## 2014-03-18 NOTE — Telephone Encounter (Signed)
[place] with me.  ?check with [facility].

## 2014-03-22 ENCOUNTER — Telehealth: Payer: Self-pay | Admitting: Pulmonary Disease

## 2014-03-22 NOTE — Telephone Encounter (Signed)
Will forward to [facility] as FYI

## 2014-05-09 ENCOUNTER — Encounter (HOSPITAL_COMMUNITY): Payer: Self-pay | Admitting: Vascular Surgery

## 2014-06-17 ENCOUNTER — Ambulatory Visit (HOSPITAL_COMMUNITY)
Admission: RE | Admit: 2014-06-17 | Discharge: 2014-06-17 | Disposition: A | Discharge status: Home / Self Care | Payer: Medicare Other | Source: Ambulatory Visit | Attending: Cardiology | Admitting: Cardiology

## 2014-06-17 ENCOUNTER — Encounter (HOSPITAL_COMMUNITY): Payer: Self-pay

## 2014-06-17 VITALS — BP 128/66 | HR 113 | Wt 251.4 lb

## 2014-06-17 DIAGNOSIS — Z79899 Other long term (current) drug therapy: Secondary | ICD-10-CM | POA: Diagnosis not present

## 2014-06-17 DIAGNOSIS — Z7901 Long term (current) use of anticoagulants: Secondary | ICD-10-CM | POA: Insufficient documentation

## 2014-06-17 DIAGNOSIS — I5032 Chronic diastolic (congestive) heart failure: Secondary | ICD-10-CM | POA: Diagnosis not present

## 2014-06-17 DIAGNOSIS — R1012 Left upper quadrant pain: Secondary | ICD-10-CM | POA: Diagnosis not present

## 2014-06-17 DIAGNOSIS — I2721 Secondary pulmonary arterial hypertension: Secondary | ICD-10-CM

## 2014-06-17 DIAGNOSIS — I517 Cardiomegaly: Secondary | ICD-10-CM | POA: Insufficient documentation

## 2014-06-17 DIAGNOSIS — Z87891 Personal history of nicotine dependence: Secondary | ICD-10-CM | POA: Insufficient documentation

## 2014-06-17 DIAGNOSIS — Z9981 Dependence on supplemental oxygen: Secondary | ICD-10-CM | POA: Diagnosis not present

## 2014-06-17 DIAGNOSIS — G4733 Obstructive sleep apnea (adult) (pediatric): Secondary | ICD-10-CM | POA: Diagnosis not present

## 2014-06-17 DIAGNOSIS — G8929 Other chronic pain: Secondary | ICD-10-CM | POA: Insufficient documentation

## 2014-06-17 DIAGNOSIS — Z7982 Long term (current) use of aspirin: Secondary | ICD-10-CM | POA: Diagnosis not present

## 2014-06-17 DIAGNOSIS — I272 Other secondary pulmonary hypertension: Secondary | ICD-10-CM | POA: Diagnosis present

## 2014-06-17 DIAGNOSIS — J449 Chronic obstructive pulmonary disease, unspecified: Secondary | ICD-10-CM | POA: Diagnosis not present

## 2014-06-17 LAB — BASIC METABOLIC PANEL
ANION GAP: 14 (ref 5–15)
BUN: 15 mg/dL (ref 6–23)
CALCIUM: 9.2 mg/dL (ref 8.4–10.5)
CHLORIDE: 98 meq/L (ref 96–112)
CO2: 26 meq/L (ref 19–32)
Creatinine, Ser: 0.8 mg/dL (ref 0.50–1.10)
GFR calc Af Amer: >90 mL/min (ref >90)
GFR calc non Af Amer: 79 mL/min — ABNORMAL LOW (ref >90)
GLUCOSE: 107 mg/dL — AB (ref 70–99)
POTASSIUM: 4.5 meq/L (ref 3.7–5.3)
SODIUM: 138 meq/L (ref 137–147)

## 2014-06-17 LAB — PRO B NATRIURETIC PEPTIDE: PRO B NATRI PEPTIDE: 489 pg/mL — AB (ref 0–125)

## 2014-06-17 MED ORDER — TORSEMIDE 20 MG PO TABS: 40.0000 mg | ORAL_TABLET | Freq: Two times a day (BID) | ORAL | Status: DC

## 2014-06-17 NOTE — Patient Instructions (Signed)
Increase Torsemide to 40 mg (2 tabs) Twice daily   Labs today  Lab in 2 weeks at Primary Care office  Your physician recommends that you schedule a follow-up appointment in: 2 months  We have provided you with a prescription for Ultram 50 mg Twice daily quantity of 14 no refills, future refills will need to come from Primary Care MD

## 2014-06-18 ENCOUNTER — Telehealth (HOSPITAL_COMMUNITY): Payer: Self-pay | Admitting: Vascular Surgery

## 2014-06-18 NOTE — Telephone Encounter (Signed)
Pt left message her water pills have not been called in and she would like her lab results.. Please advise

## 2014-06-18 NOTE — Progress Notes (Signed)
Patient ID: Alexis [name], female   DOB: [birth year], 58 y.o.   MRN: [id number] PCP: Dr Frann Rider [place] [phone]  HPI Alexis [name] is a 58 year old female with history of obesity, severe COPD, mixed PAH and diastolic dysfunction.   Initial presentation was to [facility] in 2012 with acute on chronic respiratory failure. Based on the CT scan it was felt that the patient had severe pulmonary hypertension and was referred for diagnostic cardiac catheterization. Coronaries were normal. Her PA pressures and mean pulmonary artery pressure were markedly elevated. She was found to have severe obstructive/restrictive lung disease but with very little response to bronchodilator therapy. VQ and CT negative for PE. She was then subsequently referred to Dr. Koleen Nimrod. Her PH was felt to be out of proportion to her COPD and she was started on PDE-5 inhibitor and macitentan was added, She has been intolerant of Ventavis due to cough.   Initial RHC April 02, 2011.  RA mean of 25 RV pressure is 117/13 PA 112/54 with mean of 74.  CO 5.8 liters. CI 3.3 liters.   PVR approximately 13 Woods units. Started on sildenafil (switched to tadalafil)  July 2012 pulmonary function test FVC 1.17 L per minute 37% of predicted, FEV1 1 L 40% of predicted. DLCO 61%  ECHO 06/2011 EF 60-65%. RV moderately dilated and mild to moderately hypokinetic with septal flattening RVSP 77 ECHO 10/02/12 Poor quality. EF 60-65% RV does appear better.  ECHO 1/15 EF 65% RV mildly dilated/HK D-shaped septum RVSP 72  07/12/11 6MW at pulmonary rehab unchanged from August 2012. 900 feet. 04/26/12  6MW 870 feet O2 sats 93-94% on 2 liters [place]. 10/02/12 6MW 830 ft on 2L O2 with sats 87-90% 04/16/13            6MW 760 ft on 2L O2 sats 89-93% 01/21/14            6MW 600 ft on 2L O2 sats 88-91%   10/06/12 [place] on adcirca and macitentan  RA = 15  RV = 91/15/20  PA = 90/41 (64)  PCW = 20  Fick cardiac output/index = 5.2/2.5  PVR =  8.4 Woods  FA sat = 95%  PA sat = 69%, 73%  High SVC sat = 71%  Labs 7/14: BUN 15, Creatinine 0.77, K+ 4.7 Labs 04/16/13: K 3.4 Creatinine 1.0 BUN 24   She returns for follow up. We were unable to get Tyvaso for her. Remains on Adcirca and macitentan. Says her breathing is relatively stable. Class III exertional dyspnea, mostly does ok in the house but short of breath walking longer distances.  Uses walker outside of house. Weight is up 5 lbs.  Main complaint today centers around her LUQ chronic abdominal pain.  Her arms and legs also have been hurting.  This has been going on for several weeks also.  She had a colonoscopy recently that did not show any etiology for her abdominal pain.  She says that her legs hurt too much today to do a 6 minute walk.    Allergies  Allergen Reactions  . Bactrim     Face and eye swelling  . Morphine And Related Other (See Comments)    Severe headache  . Penicillins     "doesn't work" got a lot as a child that doesn't work. But no systemic reaction.  . Sulfa Antibiotics Itching   Current Outpatient Prescriptions on File Prior to Encounter  Medication Sig Dispense Refill  . acetaminophen (TYLENOL) 500  MG tablet Take 1,000 mg by mouth every 6 (six) hours as needed. For pain      . aspirin 81 MG EC tablet Take 81 mg by mouth daily.        . Fluticasone-Salmeterol (ADVAIR DISKUS) 250-50 MCG/DOSE AEPB Inhale 1 puff into the lungs 2 (two) times daily.  60 each  6  . Macitentan (OPSUMIT) 10 MG TABS Take 10 mg by mouth daily.  90 tablet  3  . metolazone (ZAROXOLYN) 2.5 MG tablet As needed      . omeprazole (PRILOSEC) 20 MG capsule Take 20 mg by mouth 2 (two) times daily before a meal.      . potassium chloride SA (K-DUR,KLOR-CON) 20 MEQ tablet 2 tabs in AM and 2 tab in PM      . spironolactone (ALDACTONE) 25 MG tablet Take 1 tablet (25 mg total) by mouth daily.  90 tablet  3  . Tadalafil, PAH, (ADCIRCA) 20 MG TABS Take 2 tablets (40 mg total) by mouth daily.  180  tablet  3  . warfarin (COUMADIN) 5 MG tablet Take 7.5 mg daily except take 68m on Sunday  60 tablet  3   No current facility-administered medications on file prior to encounter.    Past Medical History  Diagnosis Date  . SOB (shortness of breath) on exertion   . Other chronic pulmonary heart diseases   . Chronic obstructive asthma with exacerbation     CT scan was a pattern but no evidence of pulmonary embolus and., Negative VQ scan July 2012 pulmonary function test FVC 1.17 L per minute 37% of predicted, FEV1 1 L 40% of predicted. DLCO 61% of predicted no response to bronchodilators negative HIV serology, ANA in scleroderma.  . Tobacco use disorder   . Obesity, unspecified   . Other and unspecified hyperlipidemia   . Cardiomegaly     Normal LV systolic function ejection fraction 55-60% with moderate decrease in RV function.  . Other diseases of lung, not elsewhere classified   . Hepatomegaly   . Body mass index 40.0-44.9, adult   . Edema   . Pulmonary arterial hypertension      PA pressure 112/54 mmHg the mean of 74 mmHg, right atrial pressure mean 25 mmHg. Pulmonary vascular resistance 13 with units. Cardiac output 5.8 L. Normal coronary arteries with normal LV function July 2012   . Obstructive sleep apnea     BiPAP initiated July 2012   PHYSICAL EXAM Filed Vitals:   06/17/14 1212  BP: 128/66  Pulse: 113  Weight: 251 lb 6.4 oz (114.034 kg)  SpO2: 92%   General: Chronically ill appearing No respiratory difficulty, on O2 per n/c. Husband present HEENT: normal  Neck: supple. Thick. JVP 8-9 cm. Carotids 2+ bilat; no bruits. No lymphadenopathy or thryomegaly appreciated.  Cor: PMI nonpalpable. Distant. Regular rate and rhythm. increased P2. Soft TR no RV lift Lungs: diminished throughout 2 liters [place] Abdomen: obese, +mild distention. Mildly tender LUQ. Good bowel sounds.  Extremities: no cyanosis, clubbing, rash, trace ankle edema.   Neuro: alert & oriented x 3, cranial nerves  grossly intact. moves all 4 extremities w/o difficulty. Affect pleasant.  ASSESSMENT AND PLAN  1) PAH: Mixed.  She has severe COPD and a component of group 3 PAH but also concerned for group 1 PAH.  Stable class III symptoms.   - Continue macitentan and Adcirca.  She has been unable to get Tyvaso.  - Continue O2 and CPAP. - Will repeat 6MW  at followup, says she cannot do it today due to arm/leg pain. - Continue current regimen. May be candidate for selexapeg down the road 2) Chronic diastolic HF: Weight is up, she does appear to have some volume overload on exam today.  - Increase torsemide to 40 mg bid with BMET/BNP today and in 2 wks.  3) COPD on home O2: continuous 2 liters [place].  4) GI: Chronic LUQ pain.  She has been following up on this with her physicians in [place].   Loralie Champagne 06/18/2014

## 2014-06-19 MED ORDER — TORSEMIDE 20 MG PO TABS: 40.0000 mg | ORAL_TABLET | Freq: Two times a day (BID) | ORAL | Status: DC

## 2014-06-19 NOTE — Telephone Encounter (Signed)
Spoke w/pt she is aware of lab results, torsemide sent to pharmacy

## 2014-08-15 ENCOUNTER — Encounter (HOSPITAL_COMMUNITY): Payer: Self-pay | Admitting: Internal Medicine

## 2014-08-20 ENCOUNTER — Ambulatory Visit (HOSPITAL_COMMUNITY)
Admission: RE | Admit: 2014-08-20 | Discharge: 2014-08-20 | Disposition: A | Discharge status: Home / Self Care | Payer: Medicare Other | Source: Ambulatory Visit | Attending: Internal Medicine | Admitting: Internal Medicine

## 2014-08-20 ENCOUNTER — Encounter (HOSPITAL_COMMUNITY): Payer: Self-pay

## 2014-08-20 VITALS — BP 102/66 | HR 85 | Wt 248.8 lb

## 2014-08-20 DIAGNOSIS — J449 Chronic obstructive pulmonary disease, unspecified: Secondary | ICD-10-CM | POA: Diagnosis present

## 2014-08-20 DIAGNOSIS — J441 Chronic obstructive pulmonary disease with (acute) exacerbation: Secondary | ICD-10-CM | POA: Insufficient documentation

## 2014-08-20 DIAGNOSIS — Z880 Allergy status to penicillin: Secondary | ICD-10-CM | POA: Insufficient documentation

## 2014-08-20 DIAGNOSIS — I517 Cardiomegaly: Secondary | ICD-10-CM | POA: Diagnosis not present

## 2014-08-20 DIAGNOSIS — Z7982 Long term (current) use of aspirin: Secondary | ICD-10-CM | POA: Insufficient documentation

## 2014-08-20 DIAGNOSIS — Z882 Allergy status to sulfonamides status: Secondary | ICD-10-CM | POA: Diagnosis not present

## 2014-08-20 DIAGNOSIS — R160 Hepatomegaly, not elsewhere classified: Secondary | ICD-10-CM | POA: Diagnosis not present

## 2014-08-20 DIAGNOSIS — Z885 Allergy status to narcotic agent status: Secondary | ICD-10-CM | POA: Insufficient documentation

## 2014-08-20 DIAGNOSIS — I272 Other secondary pulmonary hypertension: Secondary | ICD-10-CM | POA: Insufficient documentation

## 2014-08-20 DIAGNOSIS — I2721 Secondary pulmonary arterial hypertension: Secondary | ICD-10-CM

## 2014-08-20 DIAGNOSIS — Z7902 Long term (current) use of antithrombotics/antiplatelets: Secondary | ICD-10-CM | POA: Insufficient documentation

## 2014-08-20 DIAGNOSIS — F17200 Nicotine dependence, unspecified, uncomplicated: Secondary | ICD-10-CM | POA: Diagnosis not present

## 2014-08-20 DIAGNOSIS — G4733 Obstructive sleep apnea (adult) (pediatric): Secondary | ICD-10-CM | POA: Diagnosis not present

## 2014-08-20 DIAGNOSIS — E669 Obesity, unspecified: Secondary | ICD-10-CM | POA: Diagnosis present

## 2014-08-20 DIAGNOSIS — I5032 Chronic diastolic (congestive) heart failure: Secondary | ICD-10-CM | POA: Diagnosis not present

## 2014-08-20 DIAGNOSIS — E785 Hyperlipidemia, unspecified: Secondary | ICD-10-CM | POA: Diagnosis not present

## 2014-08-20 DIAGNOSIS — Z79899 Other long term (current) drug therapy: Secondary | ICD-10-CM | POA: Diagnosis not present

## 2014-08-20 DIAGNOSIS — J9610 Chronic respiratory failure, unspecified whether with hypoxia or hypercapnia: Secondary | ICD-10-CM

## 2014-08-20 LAB — BASIC METABOLIC PANEL
ANION GAP: 15 (ref 5–15)
BUN: 14 mg/dL (ref 6–23)
CHLORIDE: 95 meq/L — AB (ref 96–112)
CO2: 26 meq/L (ref 19–32)
CREATININE: 0.74 mg/dL (ref 0.50–1.10)
Calcium: 9.5 mg/dL (ref 8.4–10.5)
GFR calc non Af Amer: >90 mL/min (ref >90)
Glucose, Bld: 99 mg/dL (ref 70–99)
POTASSIUM: 4.2 meq/L (ref 3.7–5.3)
SODIUM: 136 meq/L — AB (ref 137–147)

## 2014-08-20 NOTE — Patient Instructions (Signed)
Follow up in 4 months.  Will call if lab results abnormal.  Do the following things EVERYDAY: 1) Weigh yourself in the morning before breakfast. Write it down and keep it in a log. 2) Take your medicines as prescribed 3) Eat low salt foods-Limit salt (sodium) to 2000 mg per day.  4) Stay as active as you can everyday 5) Limit all fluids for the day to less than 2 liters

## 2014-08-20 NOTE — Progress Notes (Signed)
6 Minute Walk  Start 94% on 2L continuous O2 via [place] 95HR  Patient ambulated 524f total, frequent rest breaks as she quickly became short of breath and c/o L hip pain.  HR ranged from 95-130 BPM, O2 sats ranged from 93-97% on 2L continuous O2 via [place].  End 93% on 2L continuous O2 via [place] HR 120

## 2014-08-20 NOTE — Progress Notes (Signed)
Patient ID: Alexis [name], female   DOB: 1956/03/22, 58 y.o.   MRN: [id number] PCP: Dr Frann Rider [place] [phone]  HPI [name] is a 58 year old female with history of obesity, severe COPD, mixed PAH and diastolic dysfunction.   Initial presentation was to [facility] in 2012 with acute on chronic respiratory failure. Based on the CT scan it was felt that the patient had severe pulmonary hypertension and was referred for diagnostic cardiac catheterization. Coronaries were normal. Her PA pressures and mean pulmonary artery pressure were markedly elevated. She was found to have severe obstructive/restrictive lung disease but with very little response to bronchodilator therapy. VQ and CT negative for PE. She was then subsequently referred to Dr. Koleen Nimrod. Her PH was felt to be out of proportion to her COPD and she was started on PDE-5 inhibitor and macitentan was added, She has been intolerant of Ventavis due to cough.   Initial RHC April 02, 2011.  RA mean of 25 RV pressure is 117/13 PA 112/54 with mean of 74.  CO 5.8 liters. CI 3.3 liters.   PVR approximately 13 Woods units. Started on sildenafil (switched to tadalafil)  July 2012 pulmonary function test FVC 1.17 L per minute 37% of predicted, FEV1 1 L 40% of predicted. DLCO 61%  ECHO 06/2011 EF 60-65%. RV moderately dilated and mild to moderately hypokinetic with septal flattening RVSP 77 ECHO 10/02/12 Poor quality. EF 60-65% RV does appear better.  ECHO 1/15 EF 65% RV mildly dilated/HK D-shaped septum RVSP 72  07/12/11 6MW at pulmonary rehab unchanged from August 2012. 900 feet. 04/26/12  6MW 870 feet O2 sats 93-94% on 2 liters [place]. 10/02/12 6MW 830 ft on 2L O2 with sats 87-90% 04/16/13            6MW 760 ft on 2L O2 sats 89-93% 01/21/14            6MW 600 ft on 2L O2 sats 88-91%   10/06/12 [place] on adcirca and macitentan  RA = 15  RV = 91/15/20  PA = 90/41 (64)  PCW = 20  Fick cardiac output/index = 5.2/2.5  PVR =  8.4 Woods  FA sat = 95%  PA sat = 69%, 73%  High SVC sat = 71%  Labs 7/14: BUN 15, Creatinine 0.77, K+ 4.7 Labs 04/16/13: K 3.4 Creatinine 1.0 BUN 24   She returns for follow up. We were unable to get Tyvaso for her. Remains on Adcirca and macitentan. At last visit her fluid was up.  Torsemide increased to 40 bid. Refused to do 6MW last time as she was feeling poorly. Weight down 3 pounds to 248.  Says her breathing is relatively stable. Class III exertional dyspnea, mostly does ok in the house but short of breath walking longer distances.  Uses walker outside of house. Walking mostly limited by back pain. Had colonoscopy which was ok except for 1 polyp. No syncope. Wearing CPAP intermittently.     Allergies  Allergen Reactions  . Bactrim     Face and eye swelling  . Morphine And Related Other (See Comments)    Severe headache  . Penicillins     "doesn't work" got a lot as a child that doesn't work. But no systemic reaction.  . Sulfa Antibiotics Itching   Current Outpatient Prescriptions on File Prior to Encounter  Medication Sig Dispense Refill  . acetaminophen (TYLENOL) 500 MG tablet Take 1,000 mg by mouth every 6 (six) hours as needed. For pain    .  aspirin 81 MG EC tablet Take 81 mg by mouth daily.      . Fluticasone-Salmeterol (ADVAIR DISKUS) 250-50 MCG/DOSE AEPB Inhale 1 puff into the lungs 2 (two) times daily. 60 each 6  . Macitentan (OPSUMIT) 10 MG TABS Take 10 mg by mouth daily. 90 tablet 3  . metolazone (ZAROXOLYN) 2.5 MG tablet As needed    . omeprazole (PRILOSEC) 20 MG capsule Take 20 mg by mouth 2 (two) times daily before a meal.    . potassium chloride SA (K-DUR,KLOR-CON) 20 MEQ tablet 2 tabs in AM and 2 tab in PM    . spironolactone (ALDACTONE) 25 MG tablet Take 1 tablet (25 mg total) by mouth daily. 90 tablet 3  . Tadalafil, PAH, (ADCIRCA) 20 MG TABS Take 2 tablets (40 mg total) by mouth daily. 180 tablet 3  . torsemide (DEMADEX) 20 MG tablet Take 2 tablets (40 mg  total) by mouth 2 (two) times daily. 240 tablet 3  . warfarin (COUMADIN) 5 MG tablet Take 7.5 mg daily except take 15m on Sunday 60 tablet 3   No current facility-administered medications on file prior to encounter.    Past Medical History  Diagnosis Date  . SOB (shortness of breath) on exertion   . Other chronic pulmonary heart diseases   . Chronic obstructive asthma with exacerbation     CT scan was a pattern but no evidence of pulmonary embolus and., Negative VQ scan July 2012 pulmonary function test FVC 1.17 L per minute 37% of predicted, FEV1 1 L 40% of predicted. DLCO 61% of predicted no response to bronchodilators negative HIV serology, ANA in scleroderma.  . Tobacco use disorder   . Obesity, unspecified   . Other and unspecified hyperlipidemia   . Cardiomegaly     Normal LV systolic function ejection fraction 55-60% with moderate decrease in RV function.  . Other diseases of lung, not elsewhere classified   . Hepatomegaly   . Body mass index 40.0-44.9, adult   . Edema   . Pulmonary arterial hypertension      PA pressure 112/54 mmHg the mean of 74 mmHg, right atrial pressure mean 25 mmHg. Pulmonary vascular resistance 13 with units. Cardiac output 5.8 L. Normal coronary arteries with normal LV function July 2012   . Obstructive sleep apnea     BiPAP initiated July 2012   PHYSICAL EXAM Filed Vitals:   08/20/14 1154  BP: 102/66  Pulse: 85  Weight: 248 lb 12.8 oz (112.855 kg)  SpO2: 92%   General: Chronically ill appearing No respiratory difficulty, on O2 per n/c. Husband present HEENT: normal  Neck: supple. Thick. JVP hard to see. Looks ok. Carotids 2+ bilat; no bruits. No lymphadenopathy or thryomegaly appreciated.  Cor: PMI nonpalpable. Distant. Regular rate and rhythm. increased P2. Soft TR no RV lift Lungs: diminished throughout 2 liters [place] Abdomen: obese, NT/ND. Good bowel sounds.  Extremities: no cyanosis, clubbing, rash, no  ankle edema.   Neuro: alert & oriented  x 3, cranial nerves grossly intact. moves all 4 extremities w/o difficulty. Affect pleasant.  ASSESSMENT AND PLAN  1) PAH: Mixed.  She has severe COPD and a component of group 3 PAH but also concerned for group 1 PAH.  Stable class III symptoms.   - Continue macitentan and Adcirca.  She has been unable to get Tyvaso.  - Continue O2 and CPAP. - Will repeat 6MW today - Continue current regimen. May be candidate for selexapeg down the road 2) Chronic diastolic HF:  Volume status looks better.   - Continue torsemide 40 mg bid. Check BMET today.  3) COPD on home O2: continuous 2 liters [place].    Glori Bickers MD 08/20/2014

## 2014-08-20 NOTE — Addendum Note (Signed)
Encounter addended by: Renee Pain, RN on: 08/20/2014  1:25 PM<BR>     Documentation filed: Notes Section

## 2014-08-20 NOTE — Addendum Note (Signed)
Encounter addended by: Renee Pain, RN on: 08/20/2014  1:13 PM<BR>     Documentation filed: [employer], Patient Instructions Section, Orders

## 2014-08-22 ENCOUNTER — Telehealth (HOSPITAL_COMMUNITY): Payer: Self-pay | Admitting: Vascular Surgery

## 2014-08-22 DIAGNOSIS — I5022 Chronic systolic (congestive) heart failure: Secondary | ICD-10-CM

## 2014-08-22 MED ORDER — POTASSIUM CHLORIDE CRYS ER 20 MEQ PO TBCR: 40.0000 meq | EXTENDED_RELEASE_TABLET | Freq: Two times a day (BID) | ORAL | Status: DC

## 2014-08-22 NOTE — Telephone Encounter (Signed)
Pt need new prescription with new dosage potassium.. Please advise

## 2014-09-09 ENCOUNTER — Other Ambulatory Visit (HOSPITAL_COMMUNITY): Payer: Self-pay | Admitting: Internal Medicine

## 2014-09-13 ENCOUNTER — Ambulatory Visit: Payer: Medicare Other | Admitting: Pulmonary Disease

## 2014-10-02 ENCOUNTER — Telehealth (HOSPITAL_COMMUNITY): Payer: Self-pay | Admitting: Vascular Surgery

## 2014-10-02 NOTE — Telephone Encounter (Signed)
PT called pt head is stopped up, fever 100.6 , her sfamily doctor gave her a antibiotic and steroid but pt feels worse she wasnts to know if there is something else she can take to help with her congestion.. Please advise

## 2014-10-03 ENCOUNTER — Telehealth (HOSPITAL_COMMUNITY): Payer: Self-pay | Admitting: Vascular Surgery

## 2014-10-03 NOTE — Telephone Encounter (Signed)
Left VM, needs to f/u with primary MD as this is not heart failure related.

## 2014-10-03 NOTE — Telephone Encounter (Signed)
Pt called again today about what she can take for her cold.. Please advise

## 2014-10-03 NOTE — Telephone Encounter (Signed)
Pt returned call regarding congestion Advised ok to take coricidin, plain mucinex, or plain robitussin

## 2014-10-03 NOTE — Telephone Encounter (Signed)
Spoke w/pt advised to stay away from decongestants

## 2014-10-15 DIAGNOSIS — J40 Bronchitis, not specified as acute or chronic: Secondary | ICD-10-CM | POA: Diagnosis not present

## 2014-10-15 DIAGNOSIS — I270 Primary pulmonary hypertension: Secondary | ICD-10-CM | POA: Diagnosis not present

## 2014-10-15 DIAGNOSIS — N390 Urinary tract infection, site not specified: Secondary | ICD-10-CM | POA: Diagnosis not present

## 2014-11-12 ENCOUNTER — Ambulatory Visit: Payer: Medicare Other | Admitting: Pulmonary Disease

## 2014-11-14 ENCOUNTER — Other Ambulatory Visit (HOSPITAL_COMMUNITY): Payer: Self-pay | Admitting: *Deleted

## 2014-11-14 DIAGNOSIS — I5032 Chronic diastolic (congestive) heart failure: Secondary | ICD-10-CM

## 2014-11-14 MED ORDER — SPIRONOLACTONE 25 MG PO TABS: 25.0000 mg | ORAL_TABLET | Freq: Every day | ORAL | Status: AC

## 2014-12-23 ENCOUNTER — Ambulatory Visit: Payer: Medicare Other | Admitting: Pulmonary Disease

## 2014-12-29 ENCOUNTER — Telehealth: Payer: Self-pay | Admitting: Cardiology

## 2014-12-29 NOTE — Telephone Encounter (Signed)
Paged re: LE edema and post menopausal bleeding in a woman on warfarin (indication PAH per patient).   Alexis [name] reports that frequently has ankle and feet (not leg) swelling that improves with lying down.For past two days, swelling hasn't gone away with lying down.  She states that "My breathing is basically the same as its been" and that the edema is still localized to the feet and ankle.   She also reports she has been having uterine bleeding x3 days. States it is not like an period - just sees blood when she wipes after using the bathroom. She is post menopausal. She skipped her dose yesterday and is still bleeding today. She has had intermittent bleeding for the past 3 mos. She had an OBGYN appointment last week but missed it. She skipped a dose of warfarin last night.   We discussed that she should take a dose of metolazone x 1 and if things have not improved by the mid morning, she should contact Dr. Clayborne Dana office for further instruction.    Regarding the bleeding, we discussed that it is never normal to have post menopausal bleeding and that she needs to see an OBGYN ASAP. She will hold her dose of warfarin tonight and call the OBGYN office tomorrow.   All questions answered. The patient was in agreement with the plan.

## 2014-12-30 ENCOUNTER — Telehealth (HOSPITAL_COMMUNITY): Payer: Self-pay

## 2014-12-30 NOTE — Telephone Encounter (Signed)
Patient called c/o edema in feet.  Called yesterday and spoke to MD on call through answering service who instructed her at that time to take 1 2.14m tablet of metolazone and elevated lower extremities.  Patient states she had a lot of urinary output and edema is no longer in legs and ankles, just in feet.  Instructed patient to take 1 more metolazone today with legs elevated.  Advised patient to call [facility] when she could get an appointment scheduled as she has not had an appointment or BMET since December of last year.  States she is also having some post menopausal bleeding, has not had a period for years, and is also on coumadin.  Advised to be seen with OBGYN asap (cancelled her appointment last week because she did not feel well).  Patient aware and agreeable to plan.  Will call back tomorrow morning with updates.

## 2015-01-01 DIAGNOSIS — Z1272 Encounter for screening for malignant neoplasm of vagina: Secondary | ICD-10-CM | POA: Diagnosis not present

## 2015-01-01 DIAGNOSIS — N950 Postmenopausal bleeding: Secondary | ICD-10-CM | POA: Diagnosis not present

## 2015-01-02 ENCOUNTER — Ambulatory Visit: Payer: Self-pay | Admitting: *Deleted

## 2015-01-02 DIAGNOSIS — Z7901 Long term (current) use of anticoagulants: Secondary | ICD-10-CM

## 2015-01-03 ENCOUNTER — Telehealth (HOSPITAL_COMMUNITY): Payer: Self-pay | Admitting: Cardiology

## 2015-01-03 NOTE — Telephone Encounter (Signed)
Multiple concerns regarding Vaginal bleeding and clots Abdominal pain- very senistive to touch (pain scale 0-10, pt answer=8) Coumadin on hold  Pt did see OBGYN recently and was advised to have additional testing (colposcopy ?/vaginal ultrasound) Pt did not schedule   Advised pt she should follow up with OBGYN/ OBGYN on call

## 2015-01-14 ENCOUNTER — Ambulatory Visit: Payer: Medicare Other | Admitting: Pulmonary Disease

## 2015-02-07 ENCOUNTER — Telehealth (HOSPITAL_COMMUNITY): Payer: Self-pay | Admitting: Vascular Surgery

## 2015-02-07 ENCOUNTER — Other Ambulatory Visit (HOSPITAL_COMMUNITY): Payer: Self-pay

## 2015-02-07 MED ORDER — METOLAZONE 2.5 MG PO TABS: 2.5000 mg | ORAL_TABLET | Freq: Every day | ORAL | Status: DC

## 2015-02-07 NOTE — Telephone Encounter (Signed)
Refill Metolazone , she also wants a referral to see a OBGYN pt states she is bleeding.. Please advise

## 2015-02-11 ENCOUNTER — Ambulatory Visit (HOSPITAL_COMMUNITY)
Admission: RE | Admit: 2015-02-11 | Discharge: 2015-02-11 | Disposition: A | Discharge status: Home / Self Care | Payer: Medicare Other | Source: Ambulatory Visit | Attending: Internal Medicine | Admitting: Internal Medicine

## 2015-02-11 ENCOUNTER — Encounter (HOSPITAL_COMMUNITY): Payer: Self-pay

## 2015-02-11 VITALS — BP 108/66 | HR 100 | Wt 245.2 lb

## 2015-02-11 DIAGNOSIS — E785 Hyperlipidemia, unspecified: Secondary | ICD-10-CM | POA: Insufficient documentation

## 2015-02-11 DIAGNOSIS — Z7901 Long term (current) use of anticoagulants: Secondary | ICD-10-CM | POA: Diagnosis not present

## 2015-02-11 DIAGNOSIS — G4733 Obstructive sleep apnea (adult) (pediatric): Secondary | ICD-10-CM | POA: Diagnosis not present

## 2015-02-11 DIAGNOSIS — I272 Other secondary pulmonary hypertension: Secondary | ICD-10-CM | POA: Diagnosis not present

## 2015-02-11 DIAGNOSIS — J9610 Chronic respiratory failure, unspecified whether with hypoxia or hypercapnia: Secondary | ICD-10-CM | POA: Diagnosis not present

## 2015-02-11 DIAGNOSIS — I5032 Chronic diastolic (congestive) heart failure: Secondary | ICD-10-CM

## 2015-02-11 DIAGNOSIS — I2721 Secondary pulmonary arterial hypertension: Secondary | ICD-10-CM

## 2015-02-11 DIAGNOSIS — I5022 Chronic systolic (congestive) heart failure: Secondary | ICD-10-CM | POA: Diagnosis not present

## 2015-02-11 DIAGNOSIS — E669 Obesity, unspecified: Secondary | ICD-10-CM | POA: Diagnosis not present

## 2015-02-11 DIAGNOSIS — J449 Chronic obstructive pulmonary disease, unspecified: Secondary | ICD-10-CM | POA: Diagnosis not present

## 2015-02-11 DIAGNOSIS — Z79899 Other long term (current) drug therapy: Secondary | ICD-10-CM | POA: Insufficient documentation

## 2015-02-11 DIAGNOSIS — Z9981 Dependence on supplemental oxygen: Secondary | ICD-10-CM | POA: Diagnosis not present

## 2015-02-11 DIAGNOSIS — Z7982 Long term (current) use of aspirin: Secondary | ICD-10-CM | POA: Diagnosis not present

## 2015-02-11 LAB — BASIC METABOLIC PANEL
Anion gap: 13 (ref 5–15)
BUN: 18 mg/dL (ref 6–20)
CHLORIDE: 87 mmol/L — AB (ref 101–111)
CO2: 32 mmol/L (ref 22–32)
CREATININE: 1.02 mg/dL — AB (ref 0.44–1.00)
Calcium: 8.8 mg/dL — ABNORMAL LOW (ref 8.9–10.3)
GFR calc non Af Amer: 59 mL/min — ABNORMAL LOW (ref >60)
GLUCOSE: 116 mg/dL — AB (ref 65–99)
POTASSIUM: 3 mmol/L — AB (ref 3.5–5.1)
SODIUM: 132 mmol/L — AB (ref 135–145)

## 2015-02-11 LAB — BRAIN NATRIURETIC PEPTIDE: B Natriuretic Peptide: 135.7 pg/mL — ABNORMAL HIGH (ref 0.0–100.0)

## 2015-02-11 MED ORDER — POTASSIUM CHLORIDE CRYS ER 20 MEQ PO TBCR: 40.0000 meq | EXTENDED_RELEASE_TABLET | Freq: Two times a day (BID) | ORAL | Status: DC

## 2015-02-11 MED ORDER — METOLAZONE 2.5 MG PO TABS: 2.5000 mg | ORAL_TABLET | ORAL | Status: DC

## 2015-02-11 MED ORDER — METOLAZONE 2.5 MG PO TABS
2.5000 mg | ORAL_TABLET | ORAL | Status: DC
Start: 2015-02-11 — End: 2015-08-05

## 2015-02-11 NOTE — Progress Notes (Signed)
Advanced Heart Failure Medication Review by a Pharmacist  Does the patient  feel that his/her medications are working for him/her?  yes  Has the patient been experiencing any side effects to the medications prescribed?  no  Does the patient measure his/her own blood pressure or blood glucose at home?  no   Does the patient have any problems obtaining medications due to transportation or finances?   no  Understanding of regimen: excellent Understanding of indications: good Potential of compliance: good    Pharmacist comments: Patient presents to heart failure clinic and her medications were reviewed with a pharmacist. She has a good understanding of her medication regimen without being prompted, and also brings in an updated list of her medications with her. She does not complain of any side effects or issues at this time and has no questions about her medications.   Alexis [name] E. Curlee Bogan, Pharm.D Clinical Pharmacy Resident Pager: [phone] 02/11/2015 9:56 AM

## 2015-02-11 NOTE — Progress Notes (Signed)
Patient ID: Alexis [name], female   DOB: December 30, 1955, 59 y.o.   MRN: [id number] PCP: Alexis [name] [facility] [phone]  HPI Alexis [name] is a 59 year old female with history of obesity, severe COPD, mixed PAH and diastolic dysfunction.   Initial presentation was to [facility] in 2012 with acute on chronic respiratory failure. Based on the CT scan it was felt that the patient had severe pulmonary hypertension and was referred for diagnostic cardiac catheterization. Coronaries were normal. Her PA pressures and mean pulmonary artery pressure were markedly elevated. She was found to have severe obstructive/restrictive lung disease but with very little response to bronchodilator therapy. VQ and CT negative for PE. She was then subsequently referred to Alexis [name]. Her PH was felt to be out of proportion to her COPD and she was started on PDE-5 inhibitor and macitentan was added, She has been intolerant of Ventavis due to cough.   She returns for follow up. Ongoing dyspnea with exertion. Denies syncope. Occasional dizziness. Weight at home Mid 240s -254 pounds. Responds to metolazone better than torsemide alone. Not using CPAP due to night sweats. Taking all medications.   Initial RHC April 02, 2011.  RA mean of 25 RV pressure is 117/13 PA 112/54 with mean of 74.  CO 5.8 liters. CI 3.3 liters.   PVR approximately 13 Woods units. Started on sildenafil (switched to tadalafil)  July 2012 pulmonary function test FVC 1.17 L per minute 37% of predicted, FEV1 1 L 40% of predicted. DLCO 61%  ECHO 06/2011 EF 60-65%. RV moderately dilated and mild to moderately hypokinetic with septal flattening RVSP 77 ECHO 10/02/12 Poor quality. EF 60-65% RV does appear better.  ECHO 1/15 EF 65% RV mildly dilated/HK D-shaped septum RVSP 72  07/12/11 6MW at pulmonary rehab unchanged from August 2012. 900 feet. 04/26/12  6MW 870 feet O2 sats 93-94% on 2 liters [place]. 10/02/12 6MW 830 ft on 2L O2 with sats  87-90% 04/16/13            6MW 760 ft on 2L O2 sats 89-93% 01/21/14            6MW 600 ft on 2L O2 sats 88-91%   10/06/12 [place] on adcirca and macitentan  RA = 15  RV = 91/15/20  PA = 90/41 (64)  PCW = 20  Fick cardiac output/index = 5.2/2.5  PVR = 8.4 Woods  FA sat = 95%  PA sat = 69%, 73%  High SVC sat = 71%  Labs 7/14: BUN 15, Creatinine 0.77, K+ 4.7 Labs 04/16/13: K 3.4 Creatinine 1.0 BUN 24      Allergies  Allergen Reactions  . Bactrim     Face and eye swelling  . Morphine And Related Other (See Comments)    Severe headache  . Penicillins     "doesn't work" got a lot as a child that doesn't work. But no systemic reaction.  . Sulfa Antibiotics Itching   Current Outpatient Prescriptions on File Prior to Encounter  Medication Sig Dispense Refill  . acetaminophen (TYLENOL) 500 MG tablet Take 1,000 mg by mouth every 6 (six) hours as needed. For pain    . ADCIRCA 20 MG TABS Take 2 tablets by mouth  daily 180 tablet 3  . aspirin 81 MG EC tablet Take 81 mg by mouth daily.      . Macitentan (OPSUMIT) 10 MG TABS Take 10 mg by mouth daily. 90 tablet 3  . metolazone (ZAROXOLYN) 2.5 MG tablet Take 1  tablet (2.5 mg total) by mouth daily. As needed 30 tablet 3  . omeprazole (PRILOSEC) 20 MG capsule Take 20 mg by mouth 2 (two) times daily before a meal.    . potassium chloride SA (K-DUR,KLOR-CON) 20 MEQ tablet Take 2 tablets (40 mEq total) by mouth 2 (two) times daily. 2 tabs in AM and 2 tab in PM 120 tablet 3  . spironolactone (ALDACTONE) 25 MG tablet Take 1 tablet (25 mg total) by mouth daily. 90 tablet 3  . torsemide (DEMADEX) 20 MG tablet Take 2 tablets (40 mg total) by mouth 2 (two) times daily. 240 tablet 3  . warfarin (COUMADIN) 5 MG tablet Take 7.5 mg daily except take 61m on Sunday (Patient taking differently: Take 5 mg by mouth daily. Take 570mdaily) 60 tablet 3  . Fluticasone-Salmeterol (ADVAIR DISKUS) 250-50 MCG/DOSE AEPB Inhale 1 puff into the lungs 2 (two) times daily. (Patient  not taking: Reported on 02/11/2015) 60 each 6   No current facility-administered medications on file prior to encounter.    Past Medical History  Diagnosis Date  . SOB (shortness of breath) on exertion   . Other chronic pulmonary heart diseases   . Chronic obstructive asthma with exacerbation     CT scan was a pattern but no evidence of pulmonary embolus and., Negative VQ scan July 2012 pulmonary function test FVC 1.17 L per minute 37% of predicted, FEV1 1 L 40% of predicted. DLCO 61% of predicted no response to bronchodilators negative HIV serology, ANA in scleroderma.  . Tobacco use disorder   . Obesity, unspecified   . Other and unspecified hyperlipidemia   . Cardiomegaly     Normal LV systolic function ejection fraction 55-60% with moderate decrease in RV function.  . Other diseases of lung, not elsewhere classified   . Hepatomegaly   . Body mass index 40.0-44.9, adult   . Edema   . Pulmonary arterial hypertension      PA pressure 112/54 mmHg the mean of 74 mmHg, right atrial pressure mean 25 mmHg. Pulmonary vascular resistance 13 with units. Cardiac output 5.8 L. Normal coronary arteries with normal LV function July 2012   . Obstructive sleep apnea     BiPAP initiated July 2012   PHYSICAL EXAM Filed Vitals:   02/11/15 0944  BP: 108/66  Pulse: 100  Weight: 245 lb 4 oz (111.245 kg)  SpO2: 94%   General: Chronically ill appearing No respiratory difficulty, on O2 per n/c. Husband present HEENT: normal  Neck: supple. Thick. JVP hard to see Carotids 2+ bilat; no bruits. No lymphadenopathy or thryomegaly appreciated.  Cor: PMI nonpalpable. Distant. Regular rate and rhythm. increased P2. Soft TR no RV lift Lungs: diminished throughout 2 liters [place] Abdomen: obese, NT/ND. Good bowel sounds.  Extremities: no cyanosis, clubbing, rash, no  ankle edema.   Neuro: alert & oriented x 3, cranial nerves grossly intact. moves all 4 extremities w/o difficulty. Affect pleasant.  ASSESSMENT AND  PLAN  1) PAH: Mixed.  She has severe COPD and a component of group 3 PAH but also concerned for group 1 PAH.  Stable class III symptoms.   - Continue macitentan and Adcirca.  She has been unable to get Tyvaso.  - Continue O2 and CPAP. - - Continue current regimen. May be candidate for selexapeg down the road Repeat ECHO  Refer to Alexis McLake Bells Check BMET today.  2) Chronic diastolic HF: Volume status stable.  Continue torsemide 40 mg bid. Increase metolazone 2.5 mg  twice a week.  Continue current potassium -- 80 meq on torsemide day and 120 meq potassium on days with metolazone.  3) COPD on home O2: continuous 2 liters [place].  4) OSA- needs to use CPAP nightly  Follow up in 3 months with ECHO and 6MW CLEGG,AMY NP-C 02/11/2015

## 2015-02-11 NOTE — Patient Instructions (Addendum)
Take Metolazone 2.5 mg (1 tablet) once on Tuesdays and Fridays only.  Continue taking potassium 2 tablets twice daily. On Tuesdays and Fridays (Metolazone days), take an EXTRA 2 tablets of potassium.  Follow up 3 months with echocardiogram.  Do the following things EVERYDAY: 1) Weigh yourself in the morning before breakfast. Write it down and keep it in a log. 2) Take your medicines as prescribed 3) Eat low salt foods-Limit salt (sodium) to 2000 mg per day.  4) Stay as active as you can everyday 5) Limit all fluids for the day to less than 2 liters

## 2015-02-12 ENCOUNTER — Telehealth (HOSPITAL_COMMUNITY): Payer: Self-pay | Admitting: Vascular Surgery

## 2015-02-12 NOTE — Telephone Encounter (Signed)
Pt is completely out of Metolazone, a prescription was sent by this office to [facility] on 02/11/15. The pt can not get the medication because her Insurance will not refill it early, because it looks like she got the medication refilled at another pharmacy. Pt states she called last week to this office and a prescription was sent to mail order pharmacy which she does not use for her metolazone, she believes this is the reason why she cant get it at [facility].. She would like someone to take care of this ASAP she completely out of medications.. Please advise

## 2015-02-12 NOTE — Telephone Encounter (Signed)
Pt unable to get meds (METOLAZONE)  as insurance has already approved meds through mail order pharm, however pt cannot afford co-pay associated with meds and they will not mail out meds until co pay has been addressed Advised pt to call mail order pharmacy and decline meds/ cancel rx so local pharmacy can fill med through her insurance Pt voiced understanding and will return call if further assistance is needed

## 2015-02-18 ENCOUNTER — Telehealth (HOSPITAL_COMMUNITY): Payer: Self-pay

## 2015-02-18 NOTE — Telephone Encounter (Signed)
Patient advised per Dr. Aundra Dubin to take extra 60 meq potassium today for recent serum K of 3.0, aware and agreeable.

## 2015-03-04 ENCOUNTER — Encounter: Payer: Self-pay | Admitting: Pulmonary Disease

## 2015-03-04 ENCOUNTER — Ambulatory Visit (INDEPENDENT_AMBULATORY_CARE_PROVIDER_SITE_OTHER): Payer: Medicare Other | Admitting: Pulmonary Disease

## 2015-03-04 VITALS — BP 96/60 | HR 97 | Ht 62.0 in | Wt 241.0 lb

## 2015-03-04 DIAGNOSIS — J438 Other emphysema: Secondary | ICD-10-CM

## 2015-03-04 DIAGNOSIS — I272 Other secondary pulmonary hypertension: Secondary | ICD-10-CM | POA: Diagnosis not present

## 2015-03-04 DIAGNOSIS — G4733 Obstructive sleep apnea (adult) (pediatric): Secondary | ICD-10-CM

## 2015-03-04 DIAGNOSIS — I2721 Secondary pulmonary arterial hypertension: Secondary | ICD-10-CM

## 2015-03-04 DIAGNOSIS — N950 Postmenopausal bleeding: Secondary | ICD-10-CM | POA: Diagnosis not present

## 2015-03-04 MED ORDER — FORMOTEROL FUMARATE 20 MCG/2ML IN NEBU: 20.0000 ug | INHALATION_SOLUTION | Freq: Two times a day (BID) | RESPIRATORY_TRACT | Status: DC

## 2015-03-04 MED ORDER — ARFORMOTEROL TARTRATE 15 MCG/2ML IN NEBU
15.0000 ug | INHALATION_SOLUTION | Freq: Two times a day (BID) | RESPIRATORY_TRACT | Status: DC
Start: 2015-03-04 — End: 2015-03-04

## 2015-03-04 MED ORDER — ARFORMOTEROL TARTRATE 15 MCG/2ML IN NEBU: 15.0000 ug | INHALATION_SOLUTION | Freq: Two times a day (BID) | RESPIRATORY_TRACT | Status: DC

## 2015-03-04 NOTE — Assessment & Plan Note (Signed)
Advised to lose weight at length.

## 2015-03-04 NOTE — Assessment & Plan Note (Signed)
She has obesity hypoventilation syndrome which is contributing significantly to her pulmonary hypertension. Currently she is noncompliant secondary to hot flashes.  Plan: Encouraged at length to use regularly We discussed different mask options that may be more comfortable for her in the setting of sweating and hot flashes

## 2015-03-04 NOTE — Patient Instructions (Signed)
Keep using her oxygen regularly We will prescribe a portable oxygen concentrator Use Pulmicort and Perforomist twice a day with a nebulizer no how you feel Increase exercise as we described Research the sleep weaver mask, if we can help you get this we are happy to help because you really need to use BIPAP every night We will see you back in 3 months or sooner if needed

## 2015-03-04 NOTE — Assessment & Plan Note (Signed)
She carries a diagnosis of COPD mostly because of air trapping seen on pulmonary function testing as well as her prior smoking history. However, by traditional criteria she does not have COPD based on her lung function testing. We know that some people with restriction as well as airway damage from tobacco use and have air trapping and COPD type symptoms in the absence of airflow obstructions I think a diagnosis of COPD is reasonable. However, considering the concomitant restrictive lung disease from her obesity it would  probably be inaccurate to characterize this as severe COPD.  She would benefit from long-acting broncho-dilators and inhaled cortical steroid. She is currently not using Advair but she says it never really helped much.  Plan: Change Advair to Pulmicort and formoterol twice a day Continue albuterol as needed Would like to enrolled in pulmonary rehabilitation but there is none available near her home, I advised her to increase her exercise at home and gave specific examples

## 2015-03-04 NOTE — Addendum Note (Signed)
Addended by: Len Blalock on: 03/04/2015 03:26 PM   Modules accepted: Orders, Medications

## 2015-03-04 NOTE — Assessment & Plan Note (Signed)
I agree that this is a mixed picture of likely WHO group 3 and group 2 and group 1 disease. She has benefited from pulmonary arterial vasodilators.    From my standpoint we need to get her on pulmonary bronchi dilators to treat her airways disease as well as improve her compliance with BiPAP. I described this to her at length today.  Plan: Pulmonary vasodilators per cardiology COPD and obesity hypoventilation management as above

## 2015-03-04 NOTE — Progress Notes (Signed)
Subjective:    Patient ID: Alexis [name], female    DOB: May 11, 1956, 59 y.o.   MRN: [id number]  Synopsis: Former patient of Dr. Gwenette Greet who has likely COPD, obesity hypoventilation syndrome, and pulmonary hypertension.  PA pressure 112/54 mmHg the mean of 74 mmHg, right atrial pressure mean 25 mmHg. Pulmonary vascular resistance 13 with units. Cardiac output 5.8 L. Normal coronary arteries with normal LV function July 2012  10/06/12 [place] on adcirca and macitentan  RA = 15  RV = 91/15/20  PA = 90/41 (64)  PCW = 20  Fick cardiac output/index = 5.2/2.5  PVR = 8.4 Woods  FA sat = 95%  PA sat = 69%, 73%  High SVC sat = 71%  BiPAP initiated July 2012 Currently on cpap with auto 4-20cm Auto 10/2013: poor compliance, AHI well controlled, optimal pressure 11cm (but not a lot of time on the device) Split night 10/2013:  AHI 5/hr, RDI 49/hr, did not meet split night criteria  Only mild airflow obstruction by PFT's, primarily manifested as airtrapping.  PFT's 03/2011:  FEV1 1.00 (40%), ratio 85, TLC 78%, ++airtrapping, DLCO 67% PFT's 09/2013 at [facility]:  FEV1 31% pred, normal ratio, mild restriction, ++airtrapping, DLCO 80% pred.  6MW 09/2013:  277m  HRCT 09/2013:  +mosaic attenuation, no definite emphysema.  AAT 10/2013:  MM, normal level [place] rehab [place]   HPI Chief Complaint  Patient presents with  . Pulmonary Consult    [facility] [place] c/o SOB with activity, PND. Denies any cough, chest tightness/congestion. Pt states she is interested in a PMarine scientist    NCherrylsays that her shortness of breath is about the same. She says that with pretty much any activity she starts to get short of breath and she uses albuterol for this. It does help somewhat. She does not have cough or mucus production. She continues to use her oxygen on a regular basis. She says that this helps. She has been using her medications for pulmonary hypertension regularly  as prescribed by the cardiology clinic. She denies significant leg swelling. She does not exercise routinely. She lately has not been taking any bronchi dilators other than albuterol because of cost reasons. She said that albuterol helps when she takes it. Advair in the past has not been very helpful. She also took Spiriva in the past and this did not make much of a difference. She does not have frequent exacerbations of COPD unless his consistently and environment around other people. 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 Also, the last time she went to pulmonary rehabilitation she got bronchitis. Since she has been trying to keep to herself at home more frequently she has not had exacerbations. Her last treatment with prednisone was in 2012. She has not been compliant with BiPAP recently because of night sweats and hot sweats.  Past Medical History  Diagnosis Date  . SOB (shortness of breath) on exertion   . Other chronic pulmonary heart diseases   . Chronic obstructive asthma with exacerbation     CT scan was a pattern but no evidence of pulmonary embolus and., Negative VQ scan July 2012 pulmonary function test FVC 1.17 L per minute 37% of predicted, FEV1 1 L 40% of predicted. DLCO 61% of predicted no response to bronchodilators negative HIV serology, ANA in scleroderma.  . Tobacco use disorder   . Obesity, unspecified   .  Other and unspecified hyperlipidemia   . Cardiomegaly     Normal LV systolic function ejection fraction 55-60% with moderate decrease in RV function.  . Other diseases of lung, not elsewhere classified   . Hepatomegaly   . Body mass index 40.0-44.9, adult   . Edema   . Pulmonary arterial hypertension      PA pressure 112/54 mmHg the mean of 74 mmHg, right atrial pressure mean 25 mmHg. Pulmonary vascular resistance 13 with units. Cardiac output 5.8 L. Normal coronary arteries with normal LV function  July 2012   . Obstructive sleep apnea     BiPAP initiated July 2012   Allergies  Allergen Reactions  . Bactrim     Face and eye swelling  . Morphine And Related Other (See Comments)    Severe headache  . Penicillins     "doesn't work" got a lot as a child that doesn't work. But no systemic reaction.  . Sulfa Antibiotics Itching     Review of Systems  Constitutional: Positive for fatigue. Negative for fever and chills.  HENT: Negative for postnasal drip, rhinorrhea and sinus pressure.   Respiratory: Positive for shortness of breath. Negative for cough and wheezing.   Cardiovascular: Negative for chest pain, palpitations and leg swelling.  Gastrointestinal: Negative for nausea, diarrhea and constipation.  Genitourinary: Negative for frequency.  Musculoskeletal: Negative for myalgias, joint swelling and gait problem.  Neurological: Positive for light-headedness and headaches. Negative for seizures.       Objective:   Physical Exam  Filed Vitals:   03/04/15 1115  BP: 96/60  Pulse: 97  Height: _0  (1.575 m)  Weight: 241 lb (109.317 kg)  SpO2: 93%   2L [place]  Gen: obese, chronically ill appearing, no acute distress HENT: NCAT, OP clear, neck supple without masses Eyes: PERRL, EOMi Lymph: no cervical lymphadenopathy PULM: CTA B, normal effort CV: RRR, no mgr, no JVD GI: BS+, soft, nontender, no hsm Derm: no rash or skin breakdown MSK: normal bulk and tone Neuro: A&Ox4, CN II-XII intact, strength 5/5 in all 4 extremities Psyche: normal mood and affect  Recent cardiology records reviewed where she is managed for pulmonary hypertension with Opsumit and Adcirca Old records reviewed extensively, see details above     Assessment & Plan:  COPD (chronic obstructive pulmonary disease) with emphysema She carries a diagnosis of COPD mostly because of air trapping seen on pulmonary function testing as well as her prior smoking history. However, by traditional criteria she does  not have COPD based on her lung function testing. We know that some people with restriction as well as airway damage from tobacco use and have air trapping and COPD type symptoms in the absence of airflow obstructions I think a diagnosis of COPD is reasonable. However, considering the concomitant restrictive lung disease from her obesity it would  probably be inaccurate to characterize this as severe COPD.  She would benefit from long-acting broncho-dilators and inhaled cortical steroid. She is currently not using Advair but she says it never really helped much.  Plan: Change Advair to Pulmicort and formoterol twice a day Continue albuterol as needed Would like to enrolled in pulmonary rehabilitation but there is none available near her home, I advised her to increase her exercise at home and gave specific examples  Morbid obesity Advised to lose weight at length.  Obstructive sleep apnea She has obesity hypoventilation syndrome which is contributing significantly to her pulmonary hypertension. Currently she is noncompliant secondary to hot flashes.  Plan: Encouraged at length to use regularly We discussed different mask options that may be more comfortable for her in the setting of sweating and hot flashes  Pulmonary arterial hypertension I agree that this is a mixed picture of likely WHO group 3 and group 2 and group 1 disease. She has benefited from pulmonary arterial vasodilators.    From my standpoint we need to get her on pulmonary bronchi dilators to treat her airways disease as well as improve her compliance with BiPAP. I described this to her at length today.  Plan: Pulmonary vasodilators per cardiology COPD and obesity hypoventilation management as above      Current outpatient prescriptions:  .  acetaminophen (TYLENOL) 500 MG tablet, Take 1,000 mg by mouth every 6 (six) hours as needed. For pain, Disp: , Rfl:  .  ADCIRCA 20 MG TABS, Take 2 tablets by mouth  daily, Disp:  180 tablet, Rfl: 3 .  albuterol (PROVENTIL HFA;VENTOLIN HFA) 108 (90 BASE) MCG/ACT inhaler, Inhale 2 puffs into the lungs every 4 (four) hours as needed for wheezing or shortness of breath., Disp: , Rfl:  .  aspirin 81 MG EC tablet, Take 81 mg by mouth daily.  , Disp: , Rfl:  .  Macitentan (OPSUMIT) 10 MG TABS, Take 10 mg by mouth daily., Disp: 90 tablet, Rfl: 3 .  metolazone (ZAROXOLYN) 2.5 MG tablet, Take 1 tablet (2.5 mg total) by mouth 2 (two) times a week. Tuesdays & Fridays, Disp: 15 tablet, Rfl: 3 .  omeprazole (PRILOSEC) 20 MG capsule, Take 20 mg by mouth 2 (two) times daily before a meal., Disp: , Rfl:  .  potassium chloride SA (K-DUR,KLOR-CON) 20 MEQ tablet, Take 2 tablets (40 mEq total) by mouth 2 (two) times daily. Take extra 40 meq (2 tablets) on Tuesdays and Fridays., Disp: 150 tablet, Rfl: 3 .  spironolactone (ALDACTONE) 25 MG tablet, Take 1 tablet (25 mg total) by mouth daily., Disp: 90 tablet, Rfl: 3 .  torsemide (DEMADEX) 20 MG tablet, Take 2 tablets (40 mg total) by mouth 2 (two) times daily., Disp: 240 tablet, Rfl: 3 .  traMADol (ULTRAM) 50 MG tablet, Take 50 mg by mouth every 12 (twelve) hours as needed for moderate pain., Disp: , Rfl:  .  warfarin (COUMADIN) 5 MG tablet, Take 7.5 mg daily except take 38m on Sunday (Patient taking differently: Take 5 mg by mouth daily. Take 538mdaily), Disp: 60 tablet, Rfl: 3 .  arformoterol (BROVANA) 15 MCG/2ML NEBU, Take 2 mLs (15 mcg total) by nebulization 2 (two) times daily., Disp: 60 mL, Rfl: 11 .  Fluticasone-Salmeterol (ADVAIR DISKUS) 250-50 MCG/DOSE AEPB, Inhale 1 puff into the lungs 2 (two) times daily. (Patient not taking: Reported on 02/11/2015), Disp: 60 each, Rfl: 6 .  formoterol (PERFOROMIST) 20 MCG/2ML nebulizer solution, Take 2 mLs (20 mcg total) by nebulization 2 (two) times daily., Disp: 60 mL, Rfl: 11

## 2015-03-05 ENCOUNTER — Telehealth: Payer: Self-pay | Admitting: Pulmonary Disease

## 2015-03-05 ENCOUNTER — Telehealth (HOSPITAL_COMMUNITY): Payer: Self-pay | Admitting: *Deleted

## 2015-03-05 DIAGNOSIS — J438 Other emphysema: Secondary | ICD-10-CM

## 2015-03-05 NOTE — Telephone Encounter (Signed)
lmtcb X1 for Alexis [name] at [facility].

## 2015-03-05 NOTE — Telephone Encounter (Signed)
Spoke with Helene Kelp, pharmacist  She states that due to medicare guidelines the pt can not take both perforomist and brovana  She can take one or the other by itself Dr. Lake Bells, please advise, thanks!

## 2015-03-05 NOTE — Telephone Encounter (Signed)
Rise Paganini states Dr Ronita Hipps needs to do a procedure on pt and wants to know what Dr Haroldine Laws would recommend for Valium dose.  Discussed w/Dr Bensimhon he states hypoxia is a concern for pt so he would use as little as possible Rise Paganini is aware and agreeable

## 2015-03-05 NOTE — Telephone Encounter (Signed)
Called and spoke to [place]. Melissa requested an order for a neb machine as BQ just prescribed neb meds for pt on 6.28.16. New order placed. Melissa verbalized understanding and denied any further questions or concerns at this time.

## 2015-03-05 NOTE — Telephone Encounter (Signed)
Melissa cb [phone]

## 2015-03-06 NOTE — Telephone Encounter (Signed)
Attempted to call [place]. They are not open yet. Will have to call back.

## 2015-03-06 NOTE — Telephone Encounter (Signed)
She is right.  There must be a mistake somewhere. I want her to take perforomist bid and budesonide 0.51m bid, both nebulized, both through [facility] (relatively new, I think it is [facility] or something like that).  Tammy Parrett knows the name of the pharmacy if there is a question.

## 2015-03-07 NOTE — Telephone Encounter (Signed)
Alexis [name] from [facility] returned call. Informed her of the recs per BQ. Alexis [name] verbalized understanding and stated she can accept a VO and will fax over the forms for BQ to sign. Nothing further needed at this time.   Will send to [facility] as FYI.

## 2015-03-18 ENCOUNTER — Telehealth: Payer: Self-pay | Admitting: Pulmonary Disease

## 2015-03-18 NOTE — Telephone Encounter (Signed)
Called and spoke with [facility] and their POC with 2 lpm continuous flow will only last 30-45 minutes. Called and spoke with patient and advised her that all the POC with continuous flow will only last 45 minutes and the only option will be to purchase another battery, which patient states that she can't afford. Advised patient that even though the portable tanks aren't as easy as the POC, this may be a better option if she can't afford the extra battery. Advised patient that most patients with POC's have purchased an extra battery for this reason and keeps one charge while the other is in use. Pt stated that she just couldn't afford this. Patient stated that she was looking at liquid o2 as well, but with [facility] you have to be on a higher liter flow and that liquid o2 can also have issues freezing as well.  Pt stated that she would decide if she wanted to do anything different and let us know if she does. Nothing else needed at this time. Rhonda J Cobb

## 2015-03-18 NOTE — Telephone Encounter (Signed)
Spoke with pt. She reports if she gets a POC and she uses 2 liters it will only last 45 min. To get an extra battery it will cost her over $300 out of pocket. She reports when she goes to the grocery store it takes more than 45 min. She wants to know if another DME offers another POC that lasts longer than 45 min. PC's please advise thanks

## 2015-03-20 DIAGNOSIS — N950 Postmenopausal bleeding: Secondary | ICD-10-CM | POA: Diagnosis not present

## 2015-06-17 ENCOUNTER — Other Ambulatory Visit (HOSPITAL_COMMUNITY): Payer: Self-pay | Admitting: Internal Medicine

## 2015-07-10 ENCOUNTER — Encounter (HOSPITAL_COMMUNITY): Payer: Medicare Other | Admitting: Internal Medicine

## 2015-07-21 DIAGNOSIS — Z23 Encounter for immunization: Secondary | ICD-10-CM | POA: Diagnosis not present

## 2015-07-29 ENCOUNTER — Encounter (HOSPITAL_COMMUNITY): Payer: Self-pay | Admitting: *Deleted

## 2015-07-29 ENCOUNTER — Telehealth (HOSPITAL_COMMUNITY): Payer: Self-pay | Admitting: Cardiology

## 2015-07-29 ENCOUNTER — Other Ambulatory Visit (HOSPITAL_COMMUNITY): Payer: Self-pay | Admitting: Student

## 2015-07-29 ENCOUNTER — Ambulatory Visit (HOSPITAL_BASED_OUTPATIENT_CLINIC_OR_DEPARTMENT_OTHER)
Admission: RE | Admit: 2015-07-29 | Discharge: 2015-07-29 | Disposition: A | Discharge status: Home / Self Care | Payer: Medicare Other | Source: Ambulatory Visit | Attending: Internal Medicine | Admitting: Internal Medicine

## 2015-07-29 ENCOUNTER — Inpatient Hospital Stay (HOSPITAL_COMMUNITY)
Admission: AD | Admit: 2015-07-29 | Discharge: 2015-08-05 | DRG: 286 | Disposition: A | Discharge status: Home / Self Care | Payer: Medicare Other | Source: Ambulatory Visit | Attending: Internal Medicine | Admitting: Internal Medicine

## 2015-07-29 ENCOUNTER — Encounter (HOSPITAL_COMMUNITY): Payer: Self-pay | Admitting: Internal Medicine

## 2015-07-29 VITALS — BP 100/64 | HR 88 | Wt 250.5 lb

## 2015-07-29 DIAGNOSIS — Z6841 Body Mass Index (BMI) 40.0 and over, adult: Secondary | ICD-10-CM | POA: Diagnosis not present

## 2015-07-29 DIAGNOSIS — D379 Neoplasm of uncertain behavior of digestive organ, unspecified: Secondary | ICD-10-CM | POA: Diagnosis not present

## 2015-07-29 DIAGNOSIS — Z885 Allergy status to narcotic agent status: Secondary | ICD-10-CM | POA: Diagnosis not present

## 2015-07-29 DIAGNOSIS — Z881 Allergy status to other antibiotic agents status: Secondary | ICD-10-CM | POA: Diagnosis not present

## 2015-07-29 DIAGNOSIS — J9620 Acute and chronic respiratory failure, unspecified whether with hypoxia or hypercapnia: Secondary | ICD-10-CM | POA: Diagnosis present

## 2015-07-29 DIAGNOSIS — Z880 Allergy status to penicillin: Secondary | ICD-10-CM | POA: Diagnosis not present

## 2015-07-29 DIAGNOSIS — E785 Hyperlipidemia, unspecified: Secondary | ICD-10-CM | POA: Diagnosis present

## 2015-07-29 DIAGNOSIS — Z7982 Long term (current) use of aspirin: Secondary | ICD-10-CM | POA: Diagnosis not present

## 2015-07-29 DIAGNOSIS — E876 Hypokalemia: Secondary | ICD-10-CM | POA: Diagnosis present

## 2015-07-29 DIAGNOSIS — I509 Heart failure, unspecified: Secondary | ICD-10-CM | POA: Diagnosis not present

## 2015-07-29 DIAGNOSIS — I272 Other secondary pulmonary hypertension: Secondary | ICD-10-CM | POA: Diagnosis present

## 2015-07-29 DIAGNOSIS — Z9981 Dependence on supplemental oxygen: Secondary | ICD-10-CM

## 2015-07-29 DIAGNOSIS — G4733 Obstructive sleep apnea (adult) (pediatric): Secondary | ICD-10-CM | POA: Diagnosis present

## 2015-07-29 DIAGNOSIS — Z882 Allergy status to sulfonamides status: Secondary | ICD-10-CM | POA: Diagnosis not present

## 2015-07-29 DIAGNOSIS — G319 Degenerative disease of nervous system, unspecified: Secondary | ICD-10-CM | POA: Diagnosis not present

## 2015-07-29 DIAGNOSIS — R221 Localized swelling, mass and lump, neck: Secondary | ICD-10-CM

## 2015-07-29 DIAGNOSIS — I5033 Acute on chronic diastolic (congestive) heart failure: Secondary | ICD-10-CM

## 2015-07-29 DIAGNOSIS — D490 Neoplasm of unspecified behavior of digestive system: Secondary | ICD-10-CM | POA: Diagnosis present

## 2015-07-29 DIAGNOSIS — Z7901 Long term (current) use of anticoagulants: Secondary | ICD-10-CM

## 2015-07-29 DIAGNOSIS — D110 Benign neoplasm of parotid gland: Secondary | ICD-10-CM | POA: Diagnosis not present

## 2015-07-29 DIAGNOSIS — R51 Headache: Secondary | ICD-10-CM | POA: Diagnosis present

## 2015-07-29 DIAGNOSIS — Z87891 Personal history of nicotine dependence: Secondary | ICD-10-CM

## 2015-07-29 DIAGNOSIS — J9621 Acute and chronic respiratory failure with hypoxia: Secondary | ICD-10-CM | POA: Diagnosis not present

## 2015-07-29 DIAGNOSIS — I5022 Chronic systolic (congestive) heart failure: Secondary | ICD-10-CM

## 2015-07-29 DIAGNOSIS — K118 Other diseases of salivary glands: Secondary | ICD-10-CM | POA: Diagnosis not present

## 2015-07-29 DIAGNOSIS — I2721 Secondary pulmonary arterial hypertension: Secondary | ICD-10-CM | POA: Insufficient documentation

## 2015-07-29 DIAGNOSIS — Z79899 Other long term (current) drug therapy: Secondary | ICD-10-CM | POA: Diagnosis not present

## 2015-07-29 DIAGNOSIS — J449 Chronic obstructive pulmonary disease, unspecified: Secondary | ICD-10-CM | POA: Diagnosis present

## 2015-07-29 LAB — COMPREHENSIVE METABOLIC PANEL
ALT: 28 U/L (ref 14–54)
AST: 29 U/L (ref 15–41)
Albumin: 3.5 g/dL (ref 3.5–5.0)
Alkaline Phosphatase: 146 U/L — ABNORMAL HIGH (ref 38–126)
Anion gap: 9 (ref 5–15)
BUN: 13 mg/dL (ref 6–20)
CHLORIDE: 102 mmol/L (ref 101–111)
CO2: 28 mmol/L (ref 22–32)
CREATININE: 0.92 mg/dL (ref 0.44–1.00)
Calcium: 9.1 mg/dL (ref 8.9–10.3)
Glucose, Bld: 91 mg/dL (ref 65–99)
POTASSIUM: 4.1 mmol/L (ref 3.5–5.1)
SODIUM: 139 mmol/L (ref 135–145)
Total Bilirubin: 1 mg/dL (ref 0.3–1.2)
Total Protein: 7 g/dL (ref 6.5–8.1)

## 2015-07-29 LAB — PROTIME-INR
INR: 1.17 (ref 0.00–1.49)
PROTHROMBIN TIME: 15.1 s (ref 11.6–15.2)

## 2015-07-29 LAB — BRAIN NATRIURETIC PEPTIDE: B NATRIURETIC PEPTIDE 5: 340.6 pg/mL — AB (ref 0.0–100.0)

## 2015-07-29 LAB — MRSA PCR SCREENING: MRSA BY PCR: NEGATIVE

## 2015-07-29 LAB — MAGNESIUM: MAGNESIUM: 2.4 mg/dL (ref 1.7–2.4)

## 2015-07-29 MED ORDER — SODIUM CHLORIDE 0.9 % IJ SOLN
3.0000 mL | INTRAMUSCULAR | Status: DC | PRN
  Administered 2015-07-30: 10 mL via INTRAVENOUS

## 2015-07-29 MED ORDER — PANTOPRAZOLE SODIUM 40 MG PO TBEC
40.0000 mg | DELAYED_RELEASE_TABLET | Freq: Every day | ORAL | Status: DC
  Administered 2015-07-29 – 2015-08-05 (×8): 40 mg via ORAL
  Filled 2015-07-29 (×8): qty 1

## 2015-07-29 MED ORDER — SODIUM CHLORIDE 0.9 % IJ SOLN
3.0000 mL | Freq: Two times a day (BID) | INTRAMUSCULAR | Status: DC
  Administered 2015-07-29 – 2015-07-30 (×4): 3 mL via INTRAVENOUS
  Administered 2015-07-31: 10 mL via INTRAVENOUS
  Administered 2015-07-31 – 2015-08-02 (×2): 3 mL via INTRAVENOUS

## 2015-07-29 MED ORDER — ONDANSETRON HCL 4 MG/2ML IJ SOLN
4.0000 mg | Freq: Four times a day (QID) | INTRAMUSCULAR | Status: DC | PRN
  Administered 2015-07-31: 4 mg via INTRAVENOUS

## 2015-07-29 MED ORDER — FUROSEMIDE 10 MG/ML IJ SOLN: 80.0000 mg | Freq: Two times a day (BID) | INTRAMUSCULAR | Status: DC

## 2015-07-29 MED ORDER — ASPIRIN EC 81 MG PO TBEC
81.0000 mg | DELAYED_RELEASE_TABLET | Freq: Every day | ORAL | Status: DC
  Administered 2015-07-30 – 2015-08-05 (×6): 81 mg via ORAL
  Filled 2015-07-29 (×6): qty 1

## 2015-07-29 MED ORDER — DOCUSATE SODIUM 50 MG PO CAPS
100.0000 mg | ORAL_CAPSULE | Freq: Two times a day (BID) | ORAL | Status: DC
  Filled 2015-07-29 (×2): qty 2

## 2015-07-29 MED ORDER — TRAMADOL HCL 50 MG PO TABS
50.0000 mg | ORAL_TABLET | Freq: Two times a day (BID) | ORAL | Status: DC | PRN
  Administered 2015-07-30: 50 mg via ORAL
  Filled 2015-07-29 (×2): qty 1

## 2015-07-29 MED ORDER — MACITENTAN 10 MG PO TABS
10.0000 mg | ORAL_TABLET | Freq: Every day | ORAL | Status: DC
  Administered 2015-07-31 – 2015-08-05 (×6): 10 mg via ORAL
  Filled 2015-07-29 (×6): qty 1

## 2015-07-29 MED ORDER — POTASSIUM CHLORIDE CRYS ER 20 MEQ PO TBCR
40.0000 meq | EXTENDED_RELEASE_TABLET | Freq: Two times a day (BID) | ORAL | Status: DC
  Administered 2015-07-29 – 2015-07-30 (×3): 40 meq via ORAL
  Filled 2015-07-29 (×3): qty 2

## 2015-07-29 MED ORDER — TADALAFIL (PAH) 20 MG PO TABS
40.0000 mg | ORAL_TABLET | Freq: Every day | ORAL | Status: DC
  Administered 2015-07-30 – 2015-08-05 (×7): 40 mg via ORAL
  Filled 2015-07-29 (×7): qty 2

## 2015-07-29 MED ORDER — SENNOSIDES-DOCUSATE SODIUM 8.6-50 MG PO TABS
1.0000 | ORAL_TABLET | Freq: Two times a day (BID) | ORAL | Status: DC
  Administered 2015-07-29 – 2015-08-04 (×12): 1 via ORAL
  Filled 2015-07-29 (×13): qty 1

## 2015-07-29 MED ORDER — SPIRONOLACTONE 25 MG PO TABS
25.0000 mg | ORAL_TABLET | Freq: Every day | ORAL | Status: DC
  Administered 2015-07-30 – 2015-08-05 (×7): 25 mg via ORAL
  Filled 2015-07-29 (×8): qty 1

## 2015-07-29 MED ORDER — SODIUM CHLORIDE 0.9 % IV SOLN: 250.0000 mL | INTRAVENOUS | Status: DC | PRN

## 2015-07-29 MED ORDER — FUROSEMIDE 10 MG/ML IJ SOLN
80.0000 mg | Freq: Once | INTRAMUSCULAR | Status: AC
  Administered 2015-07-29: 80 mg via INTRAVENOUS

## 2015-07-29 MED ORDER — METOLAZONE 2.5 MG PO TABS
2.5000 mg | ORAL_TABLET | Freq: Once | ORAL | Status: AC
  Administered 2015-07-29: 2.5 mg via ORAL
  Filled 2015-07-29 (×2): qty 1

## 2015-07-29 MED ORDER — ALBUTEROL SULFATE HFA 108 (90 BASE) MCG/ACT IN AERS: 2.0000 | INHALATION_SPRAY | RESPIRATORY_TRACT | Status: DC | PRN

## 2015-07-29 MED ORDER — ALBUTEROL SULFATE (2.5 MG/3ML) 0.083% IN NEBU: 2.5000 mg | INHALATION_SOLUTION | RESPIRATORY_TRACT | Status: DC | PRN

## 2015-07-29 MED ORDER — FUROSEMIDE 10 MG/ML IJ SOLN
INTRAMUSCULAR | Status: AC
  Filled 2015-07-29: qty 4

## 2015-07-29 MED ORDER — ACETAMINOPHEN 325 MG PO TABS: 650.0000 mg | ORAL_TABLET | ORAL | Status: DC | PRN

## 2015-07-29 MED ORDER — MOMETASONE FURO-FORMOTEROL FUM 100-5 MCG/ACT IN AERO
2.0000 | INHALATION_SPRAY | Freq: Two times a day (BID) | RESPIRATORY_TRACT | Status: DC
  Filled 2015-07-29: qty 8.8

## 2015-07-29 MED ORDER — ACETAMINOPHEN 325 MG PO TABS
650.0000 mg | ORAL_TABLET | ORAL | Status: DC | PRN
  Administered 2015-07-29 – 2015-07-31 (×6): 650 mg via ORAL
  Filled 2015-07-29 (×6): qty 2

## 2015-07-29 NOTE — Telephone Encounter (Signed)
Patient to be admitted directly from CHF clinic Cpt [phone] With pts current insurance- Medicare No pre cert is required

## 2015-07-29 NOTE — Progress Notes (Signed)
IV started  Left Forearm  22 gauze Pt tolerated well

## 2015-07-29 NOTE — Addendum Note (Signed)
Encounter addended by: Patton Salles, RN on: 07/29/2015  3:38 PM<BR>     Documentation filed: Notes Section

## 2015-07-29 NOTE — H&P (Signed)
ADVANCED HF CLINIC NOTE  Patient ID: Naseem Adler, female DOB: Sep 17, 1955, 59 y.o. MRN: [id number] PCP: Dr Frann Rider [place] [phone]  HPI Mrs Childers is a 59 year old female with history of obesity, severe COPD, mixed PAH and diastolic dysfunction.   Initial presentation was to [facility] in 2012 with acute on chronic respiratory failure. Based on the CT scan it was felt that the patient had severe pulmonary hypertension and was referred for diagnostic cardiac catheterization. Coronaries were normal. Her PA pressures and mean pulmonary artery pressure were markedly elevated. She was found to have severe obstructive/restrictive lung disease but with very little response to bronchodilator therapy. VQ and CT negative for PE. She was then subsequently referred to Dr. Koleen Nimrod. Her PH was felt to be out of proportion to her COPD and she was started on PDE-5 inhibitor and macitentan was added, She has been intolerant of Ventavis due to cough.   She returns for follow up. Says she feels miserable. Much more bloated in her abdomen. Breathing much worse. SOB with any activity. Ok at rest. Takes metolazone on occasion but tries to avoid because it makes her constipated. Not weighing at home regularly. +bendopnea/orthopnea. Watching her fluid intake. Not using CPAP. Taking all medications. Not smoking.   Initial RHC April 02, 2011.  RA mean of 25 RV pressure is 117/13 PA 112/54 with mean of 74.  CO 5.8 liters. CI 3.3 liters.  PVR approximately 13 Woods units. Started on sildenafil (switched to tadalafil)  July 2012 pulmonary function test FVC 1.17 L per minute 37% of predicted, FEV1 1 L 40% of predicted. DLCO 61%  ECHO 06/2011 EF 60-65%. RV moderately dilated and mild to moderately hypokinetic with septal flattening RVSP 77 ECHO 10/02/12 Poor quality. EF 60-65% RV does appear better.  ECHO 1/15 EF 65% RV mildly dilated/HK D-shaped septum RVSP  72  11/05/126MW at pulmonary rehab unchanged from August 2012. 900 feet. 04/26/12 6MW 870 feet O2 sats 93-94% on 2 liters [place]. 1/27/146MW 830 ft on 2L O2 with sats 87-90% 04/16/13 6MW 760 ft on 2L O2 sats 89-93% 01/21/14 6MW 600 ft on 2L O2 sats 88-91%   10/06/12 [place] on adcirca and macitentan  RA = 15  RV = 91/15/20  PA = 90/41 (64)  PCW = 20  Fick cardiac output/index = 5.2/2.5  PVR = 8.4 Woods  FA sat = 95%  PA sat = 69%, 73%  High SVC sat = 71%  Labs 7/14: BUN 15, Creatinine 0.77, K+ 4.7 Labs 04/16/13: K 3.4 Creatinine 1.0 BUN 24     Allergies  Allergen Reactions  . Bactrim     Face and eye swelling  . Morphine And Related Other (See Comments)    Severe headache  . Penicillins     "doesn't work" got a lot as a child that doesn't work. But no systemic reaction.  . Sulfa Antibiotics Itching   Current Outpatient Prescriptions on File Prior to Encounter  Medication Sig Dispense Refill  . ADCIRCA 20 MG TABS Take 2 tablets by mouth daily 180 tablet 3  . albuterol (PROVENTIL HFA;VENTOLIN HFA) 108 (90 BASE) MCG/ACT inhaler Inhale 2 puffs into the lungs every 4 (four) hours as needed for wheezing or shortness of breath.    Marland Kitchen aspirin 81 MG EC tablet Take 81 mg by mouth daily.     . Fluticasone-Salmeterol (ADVAIR DISKUS) 250-50 MCG/DOSE AEPB Inhale 1 puff into the lungs 2 (two) times daily. 60 each 6  . Macitentan (  OPSUMIT) 10 MG TABS Take 10 mg by mouth daily. 90 tablet 3  . metolazone (ZAROXOLYN) 2.5 MG tablet Take 1 tablet (2.5 mg total) by mouth 2 (two) times a week. Tuesdays & Fridays 15 tablet 3  . omeprazole (PRILOSEC) 20 MG capsule Take 20 mg by mouth 2 (two) times daily before a meal.    . potassium chloride SA (K-DUR,KLOR-CON) 20 MEQ tablet Take 2 tablets (40 mEq total) by mouth 2 (two) times daily. Take extra 40 meq (2 tablets) on Tuesdays and  Fridays. 150 tablet 3  . spironolactone (ALDACTONE) 25 MG tablet Take 1 tablet (25 mg total) by mouth daily. 90 tablet 3  . torsemide (DEMADEX) 20 MG tablet Take 2 tablets (40 mg total) by mouth 2 (two) times daily. 240 tablet 3  . warfarin (COUMADIN) 5 MG tablet Take 7.5 mg daily except take 5m on Sunday (Patient taking differently: Take 5 mg by mouth daily. Take 524mdaily) 60 tablet 3  . acetaminophen (TYLENOL) 500 MG tablet Take 1,000 mg by mouth every 6 (six) hours as needed. For pain    . traMADol (ULTRAM) 50 MG tablet Take 50 mg by mouth every 12 (twelve) hours as needed for moderate pain.     No current facility-administered medications on file prior to encounter.    Past Medical History  Diagnosis Date  . SOB (shortness of breath) on exertion   . Other chronic pulmonary heart diseases   . Chronic obstructive asthma with exacerbation (HCC)     CT scan was a pattern but no evidence of pulmonary embolus and., Negative VQ scan July 2012 pulmonary function test FVC 1.17 L per minute 37% of predicted, FEV1 1 L 40% of predicted. DLCO 61% of predicted no response to bronchodilators negative HIV serology, ANA in scleroderma.  . Tobacco use disorder   . Obesity, unspecified   . Other and unspecified hyperlipidemia   . Cardiomegaly     Normal LV systolic function ejection fraction 55-60% with moderate decrease in RV function.  . Other diseases of lung, not elsewhere classified   . Hepatomegaly   . Body mass index 40.0-44.9, adult ([place]  . Edema   . Pulmonary arterial hypertension (HCC)     PA pressure 112/54 mmHg the mean of 74 mmHg, right atrial pressure mean 25 mmHg. Pulmonary vascular resistance 13 with units. Cardiac output 5.8 L. Normal coronary arteries with normal LV function July 2012   . Obstructive sleep apnea     BiPAP initiated July 2012   PHYSICAL EXAM Filed Vitals:   07/29/15 1408    BP: 100/64  Pulse: 88  Weight: 250 lb 8 oz (113.626 kg)  SpO2: 94%   General: Chronically ill appearing. SOB at rest. Husband present HEENT: normal  Neck: supple. Thick. JVP hard to see. Appears up Carotids 2+ bilat; no bruits. No lymphadenopathy or thryomegaly appreciated.  Cor: PMI nonpalpable. Distant. Regular rate and rhythm. increased P2. Soft TR no RV lift Lungs: diminished throughout 2 liters [place] Abdomen: obese, NT. + distended. Good bowel sounds.  Extremities: no cyanosis, clubbing, rash, tr-1+ ankle edema.  Neuro: alert & oriented x 3, cranial nerves grossly intact. moves all 4 extremities w/o difficulty. Affect pleasant.  ASSESSMENT AND PLAN  1) Acute on chronic diastolic HF: Volume status appears elevated. Will admit for IV lasix and repeat echo.  2) PAH: Mixed. She has severe COPD and a component of group 3 PAH but also concerned for group 1 PAH. Stable class III  symptoms.  - Continue macitentan and Adcirca. She has been unable to get Tyvaso.  - Continue O2 and CPAP. - Continue current regimen. - Will repeat echo and possibly RHC.  - May be candidate for selexapeg down the road 3) COPD on home O2: continuous 2 liters [place].  4) OSA- needs to use CPAP nightly 5) Morbid obesity - needs weight loss.    Glori Bickers MD  07/29/2015

## 2015-07-29 NOTE — Progress Notes (Signed)
ADVANCED HF CLINIC NOTE  Patient ID: Alexis [name], female   DOB: 1955-12-05, 59 y.o.   MRN: [id number] PCP: Dr Frann Rider [place] [phone]  HPI Alexis [name] is a 59 year old female with history of obesity, severe COPD, mixed PAH and diastolic dysfunction.   Initial presentation was to [facility] in 2012 with acute on chronic respiratory failure. Based on the CT scan it was felt that the patient had severe pulmonary hypertension and was referred for diagnostic cardiac catheterization. Coronaries were normal. Her PA pressures and mean pulmonary artery pressure were markedly elevated. She was found to have severe obstructive/restrictive lung disease but with very little response to bronchodilator therapy. VQ and CT negative for PE. She was then subsequently referred to Dr. Koleen Nimrod. Her PH was felt to be out of proportion to her COPD and she was started on PDE-5 inhibitor and macitentan was added, She has been intolerant of Ventavis due to cough.   She returns for follow up. Says she feels miserable. Much more bloated in her abdomen. Breathing much worse. SOB with any activity. Ok at rest. Takes metolazone on occasion but tries to avoid because it makes her constipated. Not weighing at home regularly. +bendopnea/orthopnea. Watching her fluid intake. Not using CPAP. Taking all medications. Not smoking.   Initial RHC April 02, 2011.  RA mean of 25 RV pressure is 117/13 PA 112/54 with mean of 74.  CO 5.8 liters. CI 3.3 liters.   PVR approximately 13 Woods units. Started on sildenafil (switched to tadalafil)  July 2012 pulmonary function test FVC 1.17 L per minute 37% of predicted, FEV1 1 L 40% of predicted. DLCO 61%  ECHO 06/2011 EF 60-65%. RV moderately dilated and mild to moderately hypokinetic with septal flattening RVSP 77 ECHO 10/02/12 Poor quality. EF 60-65% RV does appear better.  ECHO 1/15 EF 65% RV mildly dilated/HK D-shaped septum RVSP 72  07/12/11 6MW at  pulmonary rehab unchanged from August 2012. 900 feet. 04/26/12  6MW 870 feet O2 sats 93-94% on 2 liters [place]. 10/02/12 6MW 830 ft on 2L O2 with sats 87-90% 04/16/13            6MW 760 ft on 2L O2 sats 89-93% 01/21/14            6MW 600 ft on 2L O2 sats 88-91%   10/06/12 [place] on adcirca and macitentan  RA = 15  RV = 91/15/20  PA = 90/41 (64)  PCW = 20  Fick cardiac output/index = 5.2/2.5  PVR = 8.4 Woods  FA sat = 95%  PA sat = 69%, 73%  High SVC sat = 71%  Labs 7/14: BUN 15, Creatinine 0.77, K+ 4.7 Labs 04/16/13: K 3.4 Creatinine 1.0 BUN 24      Allergies  Allergen Reactions  . Bactrim     Face and eye swelling  . Morphine And Related Other (See Comments)    Severe headache  . Penicillins     "doesn't work" got a lot as a child that doesn't work. But no systemic reaction.  . Sulfa Antibiotics Itching   Current Outpatient Prescriptions on File Prior to Encounter  Medication Sig Dispense Refill  . ADCIRCA 20 MG TABS Take 2 tablets by mouth  daily 180 tablet 3  . albuterol (PROVENTIL HFA;VENTOLIN HFA) 108 (90 BASE) MCG/ACT inhaler Inhale 2 puffs into the lungs every 4 (four) hours as needed for wheezing or shortness of breath.    Marland Kitchen aspirin 81 MG EC tablet Take  81 mg by mouth daily.      . Fluticasone-Salmeterol (ADVAIR DISKUS) 250-50 MCG/DOSE AEPB Inhale 1 puff into the lungs 2 (two) times daily. 60 each 6  . Macitentan (OPSUMIT) 10 MG TABS Take 10 mg by mouth daily. 90 tablet 3  . metolazone (ZAROXOLYN) 2.5 MG tablet Take 1 tablet (2.5 mg total) by mouth 2 (two) times a week. Tuesdays & Fridays 15 tablet 3  . omeprazole (PRILOSEC) 20 MG capsule Take 20 mg by mouth 2 (two) times daily before a meal.    . potassium chloride SA (K-DUR,KLOR-CON) 20 MEQ tablet Take 2 tablets (40 mEq total) by mouth 2 (two) times daily. Take extra 40 meq (2 tablets) on Tuesdays and Fridays. 150 tablet 3  . spironolactone (ALDACTONE) 25 MG tablet Take 1 tablet (25 mg total) by mouth daily. 90 tablet 3  .  torsemide (DEMADEX) 20 MG tablet Take 2 tablets (40 mg total) by mouth 2 (two) times daily. 240 tablet 3  . warfarin (COUMADIN) 5 MG tablet Take 7.5 mg daily except take 83m on Sunday (Patient taking differently: Take 5 mg by mouth daily. Take 548mdaily) 60 tablet 3  . acetaminophen (TYLENOL) 500 MG tablet Take 1,000 mg by mouth every 6 (six) hours as needed. For pain    . traMADol (ULTRAM) 50 MG tablet Take 50 mg by mouth every 12 (twelve) hours as needed for moderate pain.     No current facility-administered medications on file prior to encounter.    Past Medical History  Diagnosis Date  . SOB (shortness of breath) on exertion   . Other chronic pulmonary heart diseases   . Chronic obstructive asthma with exacerbation (HCC)     CT scan was a pattern but no evidence of pulmonary embolus and., Negative VQ scan July 2012 pulmonary function test FVC 1.17 L per minute 37% of predicted, FEV1 1 L 40% of predicted. DLCO 61% of predicted no response to bronchodilators negative HIV serology, ANA in scleroderma.  . Tobacco use disorder   . Obesity, unspecified   . Other and unspecified hyperlipidemia   . Cardiomegaly     Normal LV systolic function ejection fraction 55-60% with moderate decrease in RV function.  . Other diseases of lung, not elsewhere classified   . Hepatomegaly   . Body mass index 40.0-44.9, adult ([place]  . Edema   . Pulmonary arterial hypertension (HCC)      PA pressure 112/54 mmHg the mean of 74 mmHg, right atrial pressure mean 25 mmHg. Pulmonary vascular resistance 13 with units. Cardiac output 5.8 L. Normal coronary arteries with normal LV function July 2012   . Obstructive sleep apnea     BiPAP initiated July 2012   PHYSICAL EXAM Filed Vitals:   07/29/15 1408  BP: 100/64  Pulse: 88  Weight: 250 lb 8 oz (113.626 kg)  SpO2: 94%   General: Chronically ill appearing. SOB at rest.  Husband present HEENT: normal  Neck: supple. Thick. JVP hard to see. Appears up  Carotids  2+ bilat; no bruits. No lymphadenopathy or thryomegaly appreciated.  Cor: PMI nonpalpable. Distant. Regular rate and rhythm. increased P2. Soft TR no RV lift Lungs: diminished throughout 2 liters [place] Abdomen: obese, NT. + distended. Good bowel sounds.  Extremities: no cyanosis, clubbing, rash, tr-1+ ankle edema.   Neuro: alert & oriented x 3, cranial nerves grossly intact. moves all 4 extremities w/o difficulty. Affect pleasant.  ASSESSMENT AND PLAN  1) Acute on chronic diastolic HF: Volume  status appears elevated. Will admit for IV lasix and repeat echo.  2) PAH: Mixed.  She has severe COPD and a component of group 3 PAH but also concerned for group 1 PAH.  Stable class III symptoms.   - Continue macitentan and Adcirca.  She has been unable to get Tyvaso.  - Continue O2 and CPAP. -  Continue current regimen. - Will repeat echo and possibly RHC.  - May be candidate for selexapeg down the road 3) COPD on home O2: continuous 2 liters [place].  4) OSA- needs to use CPAP nightly 5) Morbid obesity - needs weight loss.    Glori Bickers MD  07/29/2015

## 2015-07-30 ENCOUNTER — Inpatient Hospital Stay (HOSPITAL_COMMUNITY): Payer: Medicare Other

## 2015-07-30 DIAGNOSIS — I509 Heart failure, unspecified: Secondary | ICD-10-CM

## 2015-07-30 LAB — BASIC METABOLIC PANEL
ANION GAP: 11 (ref 5–15)
BUN: 11 mg/dL (ref 6–20)
CHLORIDE: 101 mmol/L (ref 101–111)
CO2: 27 mmol/L (ref 22–32)
CREATININE: 0.94 mg/dL (ref 0.44–1.00)
Calcium: 8.5 mg/dL — ABNORMAL LOW (ref 8.9–10.3)
GFR calc non Af Amer: >60 mL/min (ref >60)
Glucose, Bld: 92 mg/dL (ref 65–99)
POTASSIUM: 3.9 mmol/L (ref 3.5–5.1)
Sodium: 139 mmol/L (ref 135–145)

## 2015-07-30 LAB — CBC WITH DIFFERENTIAL/PLATELET
Basophils Absolute: 0 10*3/uL (ref 0.0–0.1)
Basophils Relative: 0 %
EOS ABS: 0.2 10*3/uL (ref 0.0–0.7)
Eosinophils Relative: 2 %
HCT: 43.5 % (ref 36.0–46.0)
HEMOGLOBIN: 11.6 g/dL — AB (ref 12.0–15.0)
LYMPHS ABS: 1.5 10*3/uL (ref 0.7–4.0)
Lymphocytes Relative: 20 %
MCH: 23.4 pg — AB (ref 26.0–34.0)
MCHC: 26.7 g/dL — ABNORMAL LOW (ref 30.0–36.0)
MCV: 87.7 fL (ref 78.0–100.0)
Monocytes Absolute: 0.4 10*3/uL (ref 0.1–1.0)
Monocytes Relative: 6 %
NEUTROS PCT: 72 %
Neutro Abs: 5.3 10*3/uL (ref 1.7–7.7)
Platelets: 251 10*3/uL (ref 150–400)
RBC: 4.96 MIL/uL (ref 3.87–5.11)
RDW: 18.3 % — ABNORMAL HIGH (ref 11.5–15.5)
WBC: 7.4 10*3/uL (ref 4.0–10.5)

## 2015-07-30 MED ORDER — SODIUM CHLORIDE 0.9 % IJ SOLN
10.0000 mL | INTRAMUSCULAR | Status: DC | PRN
Start: 2015-07-30 — End: 2015-08-05

## 2015-07-30 MED ORDER — FUROSEMIDE 10 MG/ML IJ SOLN
80.0000 mg | Freq: Two times a day (BID) | INTRAMUSCULAR | Status: DC
  Administered 2015-07-30 – 2015-08-02 (×7): 80 mg via INTRAVENOUS
  Filled 2015-07-30 (×6): qty 8

## 2015-07-30 MED ORDER — MACITENTAN 10 MG PO TABS
10.0000 mg | ORAL_TABLET | ORAL | Status: AC
  Administered 2015-07-30: 10 mg via ORAL

## 2015-07-30 MED ORDER — PERFLUTREN LIPID MICROSPHERE
1.0000 mL | INTRAVENOUS | Status: AC | PRN
  Administered 2015-07-30: 8 mL via INTRAVENOUS
  Filled 2015-07-30: qty 10

## 2015-07-30 MED ORDER — ENOXAPARIN SODIUM 60 MG/0.6ML ~~LOC~~ SOLN
55.0000 mg | SUBCUTANEOUS | Status: DC
  Administered 2015-07-30 – 2015-08-01 (×3): 55 mg via SUBCUTANEOUS
  Filled 2015-07-30 (×3): qty 0.6

## 2015-07-30 MED ORDER — METOLAZONE 2.5 MG PO TABS
2.5000 mg | ORAL_TABLET | Freq: Once | ORAL | Status: AC
  Administered 2015-07-30: 2.5 mg via ORAL
  Filled 2015-07-30: qty 1

## 2015-07-30 MED ORDER — SODIUM CHLORIDE 0.9 % IJ SOLN
10.0000 mL | Freq: Two times a day (BID) | INTRAMUSCULAR | Status: DC
  Administered 2015-07-30 – 2015-08-05 (×9): 10 mL

## 2015-07-30 NOTE — Care Management Note (Addendum)
Case Management Note  Patient Details  Name: Alexis [name] MRN: [id number] Date of Birth: Mar 27, 1956  Subjective/Objective:      Adm w heart failure              Action/Plan: lives w husband, pcp dr Joaquim Lai   Expected Discharge Date:                  Expected Discharge Plan:     In-House Referral:     Discharge planning Services     Post Acute Care Choice:    Choice offered to:     DME Arranged:    DME Agency:     HH Arranged:    [place] Agency:     Status of Service:     Medicare Important Message Given:    Date Medicare IM Given:    Medicare IM give by:    Date Additional Medicare IM Given:    Additional Medicare Important Message give by:     If discussed at [place] of Stay Meetings, dates discussed:    Additional Comments: ur review done, will follow for hhc as pt progresses  Lacretia Leigh, RN 07/30/2015, 7:42 AM

## 2015-07-30 NOTE — Progress Notes (Signed)
Advanced Heart Failure Rounding Note  PCP:  Primary Cardiologist: Dr. Haroldine Laws   Subjective:    Admitted from clinic 07/29/15 with volume overload and bendopnea/orthopnea. At around 10 lbs up from previous baseline.   Breathing feels better but still short this am. Husband to bring in [place] later this am.  Main complaint right now is HA.  Tramadol seems to take edge off.  Peed a lot over night.   Out 1.4 L so far on IV lasix 80 mg BID with 2.5 mg of metolazone. No weight yet this am, will have weight standing. Cr stable.   Objective:   Weight Range: 250 lb (113.399 kg) Body mass index is 44.97 kg/(m^2).   Vital Signs:   Temp:  [97.3 F (36.3 C)-98.7 F (37.1 C)] 97.3 F (36.3 C) (11/23 0400) Pulse Rate:  [81-102] 89 (11/23 0400) Resp:  [22-39] 23 (11/23 0400) BP: (94-141)/(43-76) 94/61 mmHg (11/23 0400) SpO2:  [94 %-98 %] 94 % (11/23 0400) Weight:  [250 lb (113.399 kg)] 250 lb (113.399 kg) (11/22 1600) Last BM Date: 07/29/15  Weight change: Filed Weights   07/29/15 1509 07/29/15 1600  Weight: 250 lb (113.399 kg) 250 lb (113.399 kg)    Intake/Output:   Intake/Output Summary (Last 24 hours) at 07/30/15 0727 Last data filed at 07/30/15 0400  Gross per 24 hour  Intake    680 ml  Output   2100 ml  Net  -1420 ml     Physical Exam: General:  Chronically ill appearing. 2 liters [place] HEENT: normal Neck: supple. JVP difficult (thick). Carotids 2+ bilat; no bruits. No thyromegaly or nodule noted Cor: PMI nonpalpable. RRR. Increased P2. Soft TR, no RV lift Lungs: Diminished throughout Abdomen: Obese,  soft, nontender, mildly distended. No HSM. No bruits or masses. +BS Extremities: no cyanosis, clubbing, rash. Trace ankle/pretibial edema. TED hose in place.  Neuro: alert & orientedx3, cranial nerves grossly intact. moves all 4 extremities w/o difficulty. Affect pleasant  Telemetry: NSR 90s, Occasional PVCs  Labs: CBC No results for input(s): WBC, NEUTROABS, HGB,  HCT, MCV, PLT in the last 72 hours. Basic Metabolic Panel  Recent Labs  07/29/15 1500 07/30/15 0312  NA 139 139  K 4.1 3.9  CL 102 101  CO2 28 27  GLUCOSE 91 92  BUN 13 11  CREATININE 0.92 0.94  CALCIUM 9.1 8.5*  MG 2.4  --    Liver Function Tests  Recent Labs  07/29/15 1500  AST 29  ALT 28  ALKPHOS 146*  BILITOT 1.0  PROT 7.0  ALBUMIN 3.5   No results for input(s): LIPASE, AMYLASE in the last 72 hours. Cardiac Enzymes No results for input(s): CKTOTAL, CKMB, CKMBINDEX, TROPONINI in the last 72 hours.  BNP: BNP (last 3 results)  Recent Labs  02/11/15 1050 07/29/15 1500  BNP 135.7* 340.6*    ProBNP (last 3 results) No results for input(s): PROBNP in the last 8760 hours.   D-Dimer No results for input(s): DDIMER in the last 72 hours. Hemoglobin A1C No results for input(s): HGBA1C in the last 72 hours. Fasting Lipid Panel No results for input(s): CHOL, HDL, LDLCALC, TRIG, CHOLHDL, LDLDIRECT in the last 72 hours. Thyroid Function Tests No results for input(s): TSH, T4TOTAL, T3FREE, THYROIDAB in the last 72 hours.  Invalid input(s): FREET3  Other results:     Imaging/Studies:   No results found.   Medications:     Scheduled Medications: . aspirin EC  81 mg Oral Daily  . furosemide  80  mg Intravenous BID  . Macitentan  10 mg Oral Daily  . pantoprazole  40 mg Oral Daily  . potassium chloride SA  40 mEq Oral BID  . senna-docusate  1 tablet Oral BID  . sodium chloride  3 mL Intravenous Q12H  . spironolactone  25 mg Oral Daily  . Tadalafil (PAH)  40 mg Oral Daily     Infusions:     PRN Medications:  sodium chloride, acetaminophen, albuterol, ondansetron (ZOFRAN) IV, sodium chloride, traMADol   Assessment/Plan   1) Acute on chronic diastolic HF:  - Echo 1/84/03 EF 65%, mild RV enlargement, mPAP 51 - Continue IV lasix 80 mg BID, will wait on weight to see if repeat metolazone. Cr stable.  - Repeat Echo pending. Will possibly need  RHC.  2) PAH: Mixed.Severe COPD as component of group 3 PAH but also concern for group 1 PAH. Stable class III symptoms.  - Continue currently macitentan and Adcirca. She has been unable to get Tyvaso.  - Continue O2 and nightly CPAP. - May be candidate for selexapeg down the road - Had been on warfarin for [facility]. Stopped several months ago with rectal bleeding.  On Lovenox for VTE prophylaxis 3) COPD on home O2: continuous 2 liters [place].  4) OSA 5) Morbid obesity    Length of Stay: 1   Shirley Friar PA-C 07/30/2015, 7:27 AM  Advanced Heart Failure Team Pager [phone] (M-F; 7a - 4p)  Please contact [facility] for night-coverage after hours (4p -7a ) and weekends on [email or web address]  Patient seen and examined with Oda Kilts, PA-C. We discussed all aspects of the encounter. I agree with the assessment and plan as stated above.   Diuresing well. Would continue lasix and metolazone for now. Repeat echo today. Will need RHC prior to d/c.   Dannisha Eckmann,MD 8:17 AM

## 2015-07-30 NOTE — Progress Notes (Signed)
  Echocardiogram 2D Echocardiogram has been performed.  Alexis [name] 07/30/2015, 9:53 AM

## 2015-07-30 NOTE — Progress Notes (Signed)
Peripherally Inserted Central Catheter/Midline Placement  The IV Nurse has discussed with the patient and/or persons authorized to consent for the patient, the purpose of this procedure and the potential benefits and risks involved with this procedure.  The benefits include less needle sticks, lab draws from the catheter and patient may be discharged home with the catheter.  Risks include, but not limited to, infection, bleeding, blood clot (thrombus formation), and puncture of an artery; nerve damage and irregular heat beat.  Alternatives to this procedure were also discussed.  PICC/Midline Placement Documentation        Alexis [name] 07/30/2015, 2:40 PM

## 2015-07-31 DIAGNOSIS — J9621 Acute and chronic respiratory failure with hypoxia: Secondary | ICD-10-CM

## 2015-07-31 LAB — BASIC METABOLIC PANEL
ANION GAP: 12 (ref 5–15)
BUN: 13 mg/dL (ref 6–20)
CALCIUM: 9 mg/dL (ref 8.9–10.3)
CHLORIDE: 86 mmol/L — AB (ref 101–111)
CO2: 39 mmol/L — AB (ref 22–32)
Creatinine, Ser: 0.98 mg/dL (ref 0.44–1.00)
GFR calc non Af Amer: >60 mL/min (ref >60)
Glucose, Bld: 106 mg/dL — ABNORMAL HIGH (ref 65–99)
Potassium: 3 mmol/L — ABNORMAL LOW (ref 3.5–5.1)
Sodium: 137 mmol/L (ref 135–145)

## 2015-07-31 MED ORDER — OXYCODONE-ACETAMINOPHEN 5-325 MG PO TABS
1.0000 | ORAL_TABLET | Freq: Four times a day (QID) | ORAL | Status: DC | PRN
  Administered 2015-07-31: 2 via ORAL
  Filled 2015-07-31: qty 2

## 2015-07-31 MED ORDER — POTASSIUM CHLORIDE CRYS ER 20 MEQ PO TBCR
40.0000 meq | EXTENDED_RELEASE_TABLET | Freq: Three times a day (TID) | ORAL | Status: DC
  Administered 2015-07-31 – 2015-08-01 (×5): 40 meq via ORAL
  Filled 2015-07-31 (×6): qty 2

## 2015-07-31 MED ORDER — METOLAZONE 2.5 MG PO TABS
2.5000 mg | ORAL_TABLET | Freq: Every day | ORAL | Status: DC
  Administered 2015-07-31 – 2015-08-01 (×2): 2.5 mg via ORAL
  Filled 2015-07-31 (×3): qty 1

## 2015-07-31 MED ORDER — ONDANSETRON HCL 4 MG/2ML IJ SOLN
INTRAMUSCULAR | Status: AC
  Administered 2015-07-31: 4 mg
  Filled 2015-07-31: qty 2

## 2015-07-31 MED ORDER — POTASSIUM CHLORIDE CRYS ER 20 MEQ PO TBCR
40.0000 meq | EXTENDED_RELEASE_TABLET | Freq: Once | ORAL | Status: AC
  Administered 2015-07-31: 40 meq via ORAL
  Filled 2015-07-31: qty 2

## 2015-07-31 MED ORDER — TRAMADOL HCL 50 MG PO TABS: 100.0000 mg | ORAL_TABLET | Freq: Four times a day (QID) | ORAL | Status: DC | PRN

## 2015-07-31 NOTE — Progress Notes (Signed)
Advanced Heart Failure Rounding Note  PCP:  Primary Cardiologist: Dr. Haroldine Laws   Subjective:    Admitted from clinic 07/29/15 with volume overload and bendopnea/orthopnea.  Continues to diurese well. Weight down 11 pounds. Breathing better. C/o HA and leg pain. CVP 18  Echo 11/13 LVEF 55-60% RV severely dilated and HK RVSP 120mHG  Objective:   Weight Range: 108.773 kg (239 lb 12.8 oz) Body mass index is 43.13 kg/(m^2).   Vital Signs:   Temp:  [97.8 F (36.6 C)-98.9 F (37.2 C)] 98.7 F (37.1 C) (11/24 0410) Pulse Rate:  [84-96] 84 (11/24 0410) Resp:  [17-31] 23 (11/24 0410) BP: (97-119)/(47-62) 103/56 mmHg (11/24 0410) SpO2:  [92 %-96 %] 96 % (11/24 0410) Weight:  [108.773 kg (239 lb 12.8 oz)] 108.773 kg (239 lb 12.8 oz) (11/24 0410) Last BM Date: 07/29/15  Weight change: Filed Weights   07/29/15 1600 07/30/15 0707 07/31/15 0410  Weight: 113.399 kg (250 lb) 111.5 kg (245 lb 13 oz) 108.773 kg (239 lb 12.8 oz)    Intake/Output:   Intake/Output Summary (Last 24 hours) at 07/31/15 0801 Last data filed at 07/31/15 0500  Gross per 24 hour  Intake    560 ml  Output   4450 ml  Net  -3890 ml     Physical Exam: General:  Chronically ill appearing. 2 liters [place] HEENT: normal Neck: supple. JVP difficult (thick). Carotids 2+ bilat; no bruits. No thyromegaly or nodule noted Cor: PMI nonpalpable. RRR. Increased P2. Soft TR, no RV lift Lungs: Diminished throughout Abdomen: Obese,  soft, nontender, mildly distended. No HSM. No bruits or masses. +BS Extremities: no cyanosis, clubbing, rash. 2-3+ edema L>R TED hose in place.  Neuro: alert & orientedx3, cranial nerves grossly intact. moves all 4 extremities w/o difficulty. Affect pleasant  Telemetry: NSR 90s, Occasional PVCs  Labs: CBC  Recent Labs  07/30/15 0825  WBC 7.4  NEUTROABS 5.3  HGB 11.6*  HCT 43.5  MCV 87.7  PLT 2240  Basic Metabolic Panel  Recent Labs  07/29/15 1500 07/30/15 0312 07/31/15 0517   NA 139 139 137  K 4.1 3.9 3.0*  CL 102 101 86*  CO2 28 27 39*  GLUCOSE 91 92 106*  BUN _0 CREATININE 0.92 0.94 0.98  CALCIUM 9.1 8.5* 9.0  MG 2.4  --   --    Liver Function Tests  Recent Labs  07/29/15 1500  AST 29  ALT 28  ALKPHOS 146*  BILITOT 1.0  PROT 7.0  ALBUMIN 3.5   No results for input(s): LIPASE, AMYLASE in the last 72 hours. Cardiac Enzymes No results for input(s): CKTOTAL, CKMB, CKMBINDEX, TROPONINI in the last 72 hours.  BNP: BNP (last 3 results)  Recent Labs  02/11/15 1050 07/29/15 1500  BNP 135.7* 340.6*    ProBNP (last 3 results) No results for input(s): PROBNP in the last 8760 hours.   D-Dimer No results for input(s): DDIMER in the last 72 hours. Hemoglobin A1C No results for input(s): HGBA1C in the last 72 hours. Fasting Lipid Panel No results for input(s): CHOL, HDL, LDLCALC, TRIG, CHOLHDL, LDLDIRECT in the last 72 hours. Thyroid Function Tests No results for input(s): TSH, T4TOTAL, T3FREE, THYROIDAB in the last 72 hours.  Invalid input(s): FREET3  Other results:     Imaging/Studies:  No results found.   Medications:     Scheduled Medications: . aspirin EC  81 mg Oral Daily  . enoxaparin (LOVENOX) injection  55 mg Subcutaneous Q24H  .  furosemide  80 mg Intravenous BID  . Macitentan  10 mg Oral Daily  . pantoprazole  40 mg Oral Daily  . potassium chloride SA  40 mEq Oral BID  . senna-docusate  1 tablet Oral BID  . sodium chloride  10-40 mL Intracatheter Q12H  . sodium chloride  3 mL Intravenous Q12H  . spironolactone  25 mg Oral Daily  . Tadalafil (PAH)  40 mg Oral Daily    Infusions:    PRN Medications: sodium chloride, acetaminophen, albuterol, ondansetron (ZOFRAN) IV, sodium chloride, sodium chloride, traMADol   Assessment/Plan   1) Acute on chronic diastolic HF:  - Echo 0/68/93 EF 65%, mild RV enlargement, mPAP 51 - Repeat Echo pending. Will possibly need RHC.  2) PAH: Mixed.Severe COPD as  component of group 3 PAH but also concern for group 1 PAH. Stable class III symptoms.  - Continue currently macitentan and Adcirca. She has been unable to get Tyvaso.  - Continue O2 and nightly CPAP. - May be candidate for selexapeg down the road - Had been on warfarin for [facility]. Stopped several months ago with rectal bleeding.  On Lovenox for VTE prophylaxis 3) COPD on home O2: continuous 2 liters [place].  4) OSA 5) Morbid obesity  6) Hypokalemia  Echo reviewed personally. Continues to diurese well. Renal function stable. CVP 18. Will continue lasix and metolazone. Continue combination therapy for PAH. Will check co-ox. Will need RHC prior to d/c and consideration of selexapeg. Tramadol for HA. LLE seesm slightly more swollen RLE. If not improving with diuresis will get u/s.    Taralynn Quiett,MD 8:01 AM Advanced Heart Failure Team Pager [phone] (M-F; 7a - 4p)  Please contact [facility] for night-coverage after hours (4p -7a ) and weekends on [email or web address]

## 2015-08-01 LAB — BASIC METABOLIC PANEL
Anion gap: 10 (ref 5–15)
BUN: 16 mg/dL (ref 6–20)
CHLORIDE: 83 mmol/L — AB (ref 101–111)
CO2: 42 mmol/L — AB (ref 22–32)
CREATININE: 1.14 mg/dL — AB (ref 0.44–1.00)
Calcium: 9.1 mg/dL (ref 8.9–10.3)
GFR calc non Af Amer: 51 mL/min — ABNORMAL LOW (ref >60)
GFR, EST AFRICAN AMERICAN: 59 mL/min — AB (ref >60)
Glucose, Bld: 109 mg/dL — ABNORMAL HIGH (ref 65–99)
Potassium: 3.7 mmol/L (ref 3.5–5.1)
Sodium: 135 mmol/L (ref 135–145)

## 2015-08-01 MED ORDER — KETOROLAC TROMETHAMINE 30 MG/ML IJ SOLN
30.0000 mg | Freq: Once | INTRAMUSCULAR | Status: AC
  Administered 2015-08-01: 30 mg via INTRAVENOUS
  Filled 2015-08-01: qty 1

## 2015-08-01 MED ORDER — ENOXAPARIN SODIUM 60 MG/0.6ML ~~LOC~~ SOLN
50.0000 mg | SUBCUTANEOUS | Status: DC
  Administered 2015-08-02 – 2015-08-03 (×2): 50 mg via SUBCUTANEOUS
  Filled 2015-08-01 (×3): qty 0.6

## 2015-08-01 NOTE — Progress Notes (Signed)
Advanced Heart Failure Rounding Note  PCP:  Primary Cardiologist: Dr. Haroldine Laws   Subjective:    Admitted from clinic 07/29/15 with volume overload and bendopnea/orthopnea.  Continues to diurese well. Weight down another 6 pounds - 17 pounds total. Breathing better. Still with HA and leg pain. CVP 14  Echo 11/23 LVEF 55-60% RV severely dilated and HK RVSP 171mHG  Objective:   Weight Range: 105.688 kg (233 lb) Body mass index is 41.91 kg/(m^2).   Vital Signs:   Temp:  [97.9 F (36.6 C)-99.5 F (37.5 C)] 98.4 F (36.9 C) (11/25 0753) Pulse Rate:  [83-110] 103 (11/25 0753) Resp:  [20-25] 21 (11/25 0753) BP: (85-134)/(50-71) 107/51 mmHg (11/25 0753) SpO2:  [89 %-100 %] 89 % (11/25 0753) Weight:  [105.688 kg (233 lb)] 105.688 kg (233 lb) (11/25 0500) Last BM Date: 07/29/15  Weight change: Filed Weights   07/30/15 0707 07/31/15 0410 08/01/15 0500  Weight: 111.5 kg (245 lb 13 oz) 108.773 kg (239 lb 12.8 oz) 105.688 kg (233 lb)    Intake/Output:   Intake/Output Summary (Last 24 hours) at 08/01/15 0830 Last data filed at 08/01/15 0400  Gross per 24 hour  Intake    390 ml  Output   4100 ml  Net  -3710 ml     Physical Exam: General:  Chronically ill appearing. 2 liters [place] HEENT: normal Neck: supple. JVP difficult (thick). Carotids 2+ bilat; no bruits. No thyromegaly or nodule noted Cor: PMI nonpalpable. RRR. Increased P2. Soft TR, no RV lift Lungs: Diminished throughout Abdomen: Obese,  soft, nontender, mildly distended. No HSM. No bruits or masses. +BS Extremities: no cyanosis, clubbing, rash. 2+ edema L>R TED hose in place.  Neuro: alert & orientedx3, cranial nerves grossly intact. moves all 4 extremities w/o difficulty. Affect pleasant  Telemetry: NSR 90s, Occasional PVCs  Labs: CBC  Recent Labs  07/30/15 0825  WBC 7.4  NEUTROABS 5.3  HGB 11.6*  HCT 43.5  MCV 87.7  PLT 2103  Basic Metabolic Panel  Recent Labs  07/29/15 1500  07/31/15 0517  08/01/15 0517  NA 139  < > 137 135  K 4.1  < > 3.0* 3.7  CL 102  < > 86* 83*  CO2 28  < > 39* 42*  GLUCOSE 91  < > 106* 109*  BUN 13  < > 13 16  CREATININE 0.92  < > 0.98 1.14*  CALCIUM 9.1  < > 9.0 9.1  MG 2.4  --   --   --   < > = values in this interval not displayed. Liver Function Tests  Recent Labs  07/29/15 1500  AST 29  ALT 28  ALKPHOS 146*  BILITOT 1.0  PROT 7.0  ALBUMIN 3.5   No results for input(s): LIPASE, AMYLASE in the last 72 hours. Cardiac Enzymes No results for input(s): CKTOTAL, CKMB, CKMBINDEX, TROPONINI in the last 72 hours.  BNP: BNP (last 3 results)  Recent Labs  02/11/15 1050 07/29/15 1500  BNP 135.7* 340.6*    ProBNP (last 3 results) No results for input(s): PROBNP in the last 8760 hours.   D-Dimer No results for input(s): DDIMER in the last 72 hours. Hemoglobin A1C No results for input(s): HGBA1C in the last 72 hours. Fasting Lipid Panel No results for input(s): CHOL, HDL, LDLCALC, TRIG, CHOLHDL, LDLDIRECT in the last 72 hours. Thyroid Function Tests No results for input(s): TSH, T4TOTAL, T3FREE, THYROIDAB in the last 72 hours.  Invalid input(s): FREET3  Other results:  Imaging/Studies:  No results found.   Medications:     Scheduled Medications: . aspirin EC  81 mg Oral Daily  . enoxaparin (LOVENOX) injection  55 mg Subcutaneous Q24H  . furosemide  80 mg Intravenous BID  . Macitentan  10 mg Oral Daily  . metolazone  2.5 mg Oral Daily  . pantoprazole  40 mg Oral Daily  . potassium chloride SA  40 mEq Oral TID  . senna-docusate  1 tablet Oral BID  . sodium chloride  10-40 mL Intracatheter Q12H  . sodium chloride  3 mL Intravenous Q12H  . spironolactone  25 mg Oral Daily  . Tadalafil (PAH)  40 mg Oral Daily    Infusions:    PRN Medications: sodium chloride, acetaminophen, albuterol, ondansetron (ZOFRAN) IV, oxyCODONE-acetaminophen, sodium chloride, sodium chloride, traMADol   Assessment/Plan   1) Acute  on chronic diastolic HF:  - Echo 8/88/28 EF 65%, mild RV enlargement, mPAP 51 - Repeat Echo pending. Will possibly need RHC.  2) PAH: Mixed.Severe COPD as component of group 3 PAH but also concern for group 1 PAH. Stable class III symptoms.  - Continue currently macitentan and Adcirca. She has been unable to get Tyvaso.  - Continue O2 and nightly CPAP. - May be candidate for selexapeg down the road - Had been on warfarin for [facility]. Stopped several months ago with rectal bleeding.  On Lovenox for VTE prophylaxis 3) COPD on home O2: continuous 2 liters [place].  4) OSA 5) Morbid obesity  6) Hypokalemia  Continues to diurese well. Renal function stable. CVP 18. Will continue lasix and metolazone. Continue combination therapy for PAH. Will check co-ox. Will need RHC prior to d/c (on Monday) and consideration of selexapeg.   Jaclynn Laumann,MD 8:30 AM Advanced Heart Failure Team Pager [phone] (M-F; 7a - 4p)  Please contact [facility] for night-coverage after hours (4p -7a ) and weekends on [email or web address]

## 2015-08-02 ENCOUNTER — Inpatient Hospital Stay (HOSPITAL_COMMUNITY): Payer: Medicare Other

## 2015-08-02 LAB — BASIC METABOLIC PANEL
ANION GAP: 7 (ref 5–15)
BUN: 27 mg/dL — ABNORMAL HIGH (ref 6–20)
CALCIUM: 9.2 mg/dL (ref 8.9–10.3)
CO2: 44 mmol/L — AB (ref 22–32)
Chloride: 82 mmol/L — ABNORMAL LOW (ref 101–111)
Creatinine, Ser: 1.22 mg/dL — ABNORMAL HIGH (ref 0.44–1.00)
GFR, EST AFRICAN AMERICAN: 54 mL/min — AB (ref >60)
GFR, EST NON AFRICAN AMERICAN: 47 mL/min — AB (ref >60)
GLUCOSE: 113 mg/dL — AB (ref 65–99)
POTASSIUM: 4.2 mmol/L (ref 3.5–5.1)
Sodium: 133 mmol/L — ABNORMAL LOW (ref 135–145)

## 2015-08-02 MED ORDER — KETOROLAC TROMETHAMINE 30 MG/ML IJ SOLN
30.0000 mg | Freq: Two times a day (BID) | INTRAMUSCULAR | Status: DC | PRN
  Administered 2015-08-02 – 2015-08-04 (×4): 30 mg via INTRAVENOUS
  Filled 2015-08-02 (×4): qty 1

## 2015-08-02 MED ORDER — SODIUM CHLORIDE 0.9 % IJ SOLN: 10.0000 mL | INTRAMUSCULAR | Status: DC | PRN

## 2015-08-02 MED ORDER — SODIUM CHLORIDE 0.9 % IJ SOLN
10.0000 mL | Freq: Two times a day (BID) | INTRAMUSCULAR | Status: DC
  Administered 2015-08-02 – 2015-08-03 (×2): 20 mL
  Administered 2015-08-03 – 2015-08-05 (×4): 10 mL

## 2015-08-02 MED ORDER — TORSEMIDE 20 MG PO TABS
40.0000 mg | ORAL_TABLET | Freq: Two times a day (BID) | ORAL | Status: DC
  Administered 2015-08-02 – 2015-08-03 (×2): 40 mg via ORAL
  Filled 2015-08-02 (×2): qty 2

## 2015-08-02 MED ORDER — POTASSIUM CHLORIDE ER 10 MEQ PO TBCR
20.0000 meq | EXTENDED_RELEASE_TABLET | Freq: Two times a day (BID) | ORAL | Status: DC
  Administered 2015-08-02 – 2015-08-05 (×8): 20 meq via ORAL
  Filled 2015-08-02 (×15): qty 2

## 2015-08-02 NOTE — Progress Notes (Signed)
Advanced Heart Failure Rounding Note  PCP:  Primary Cardiologist: Dr. Haroldine Laws   Subjective:    Admitted from clinic 07/29/15 with volume overload and bendopnea/orthopnea.  Diuresis has slowed down. CVP now 9-10. Breathing better. Still with HA but got good relief with one dose toradol yesterday. (failed tramadol, percocet and tylenol)   Echo 11/23 LVEF 55-60% RV severely dilated and HK RVSP 127mHG  Objective:   Weight Range: 106.55 kg (234 lb 14.4 oz) Body mass index is 42.25 kg/(m^2).   Vital Signs:   Temp:  [98 F (36.7 C)-98.4 F (36.9 C)] 98 F (36.7 C) (11/26 0326) Pulse Rate:  [91-108] 95 (11/26 0800) Resp:  [18-23] 22 (11/26 0800) BP: (85-114)/(49-64) 101/64 mmHg (11/26 0716) SpO2:  [82 %-100 %] 92 % (11/26 0800) Weight:  [106.55 kg (234 lb 14.4 oz)] 106.55 kg (234 lb 14.4 oz) (11/26 0203) Last BM Date: 07/30/15  Weight change: Filed Weights   07/31/15 0410 08/01/15 0500 08/02/15 0203  Weight: 108.773 kg (239 lb 12.8 oz) 105.688 kg (233 lb) 106.55 kg (234 lb 14.4 oz)    Intake/Output:   Intake/Output Summary (Last 24 hours) at 08/02/15 1047 Last data filed at 08/02/15 1004  Gross per 24 hour  Intake    920 ml  Output   3300 ml  Net  -2380 ml     Physical Exam: General:  Chronically ill appearing. 2 liters [place] HEENT: normal Neck: supple. JVP difficult (thick). Carotids 2+ bilat; no bruits. No thyromegaly or nodule noted. Cystic fullness under left mandible Cor: PMI nonpalpable. RRR. Increased P2. Soft TR, no RV lift Lungs: Diminished throughout Abdomen: Obese,  soft, nontender, mildly distended. No HSM. No bruits or masses. +BS Extremities: no cyanosis, clubbing, rash. Tr-1+ edema L>R TED hose in place.  Neuro: alert & orientedx3, cranial nerves grossly intact. moves all 4 extremities w/o difficulty. Affect pleasant  Telemetry: NSR 90s, Occasional PVCs  Labs: CBC No results for input(s): WBC, NEUTROABS, HGB, HCT, MCV, PLT in the last 72  hours. Basic Metabolic Panel  Recent Labs  08/01/15 0517 08/02/15 0450  NA 135 133*  K 3.7 4.2  CL 83* 82*  CO2 42* 44*  GLUCOSE 109* 113*  BUN 16 27*  CREATININE 1.14* 1.22*  CALCIUM 9.1 9.2   Liver Function Tests No results for input(s): AST, ALT, ALKPHOS, BILITOT, PROT, ALBUMIN in the last 72 hours. No results for input(s): LIPASE, AMYLASE in the last 72 hours. Cardiac Enzymes No results for input(s): CKTOTAL, CKMB, CKMBINDEX, TROPONINI in the last 72 hours.  BNP: BNP (last 3 results)  Recent Labs  02/11/15 1050 07/29/15 1500  BNP 135.7* 340.6*    ProBNP (last 3 results) No results for input(s): PROBNP in the last 8760 hours.   D-Dimer No results for input(s): DDIMER in the last 72 hours. Hemoglobin A1C No results for input(s): HGBA1C in the last 72 hours. Fasting Lipid Panel No results for input(s): CHOL, HDL, LDLCALC, TRIG, CHOLHDL, LDLDIRECT in the last 72 hours. Thyroid Function Tests No results for input(s): TSH, T4TOTAL, T3FREE, THYROIDAB in the last 72 hours.  Invalid input(s): FREET3  Other results:     Imaging/Studies:  No results found.   Medications:     Scheduled Medications: . aspirin EC  81 mg Oral Daily  . enoxaparin (LOVENOX) injection  50 mg Subcutaneous Q24H  . furosemide  80 mg Intravenous BID  . Macitentan  10 mg Oral Daily  . metolazone  2.5 mg Oral Daily  . pantoprazole  40 mg Oral Daily  . potassium chloride SA  40 mEq Oral TID  . senna-docusate  1 tablet Oral BID  . sodium chloride  10-40 mL Intracatheter Q12H  . sodium chloride  3 mL Intravenous Q12H  . spironolactone  25 mg Oral Daily  . Tadalafil (PAH)  40 mg Oral Daily    Infusions:    PRN Medications: sodium chloride, acetaminophen, albuterol, ondansetron (ZOFRAN) IV, oxyCODONE-acetaminophen, sodium chloride, sodium chloride, traMADol   Assessment/Plan   1) Acute on chronic diastolic HF:  - Volume status improved. Will change back to po diuretics 2)  PAH: Mixed.Severe COPD as component of group 3 PAH but also concern for group 1 PAH. Stable class III symptoms.  - PA pressures ~170mHG by echo.  - Continue currently macitentan and Adcirca. She has been unable to get Tyvaso.  -[facility] HeadlandRHC Monday am - Continue O2 and nightly CPAP. - May be candidate for selexapeg down the road - [facility].  On Lovenox for VTE prophylaxis 3) COPD on home O2: continuous 2 liters [place].  4) OSA 5) Morbid obesity  6) Hypokalemia 7) Left submandibular fullness - says it has been increasing over past few months. Will get head and neck CT to further evalaute 8) Headache - will repeat toradol x 1 9) Deconditioning - have CR see.   Bensimhon, Daniel,MD 10:47 AM Advanced Heart Failure Team Pager [phone](M-F; 7a - 4p)  Please contact [facility] for night-coverage after hours (4p -7a ) and weekends on [email or web address]

## 2015-08-02 NOTE — Progress Notes (Signed)
CARDIAC REHAB PHASE I   PRE:  Rate/Rhythm: 94 sinus rhythm  BP:  Supine:   Sitting: 114/60  Standing:    SaO2: 97% 4L  MODE:  Ambulation: 350 ft   POST:  Rate/Rhythem: 108 sinus tachycardia  BP:  Supine:   Sitting: 100/67  Standing:    SaO2: 93% 4L  1140-1220  Pt ambulated in hallway using rolling walker, slow steady gait.  Asymptomatic. Pt took several standing rest breaks.  Pt returned to bed, call light in reach. Pt education started including low sodium diet, when to call MD and daily weights.  Pt states it is difficult for her to weight daily due to space constraints in her mobile home.  Pt has scale that is located under her bathroom sink, she is unable to bend down. Pt denies any other space in home that will accomodate scale.  Pt verbalized understanding.   Rion, [place]

## 2015-08-03 DIAGNOSIS — D379 Neoplasm of uncertain behavior of digestive organ, unspecified: Secondary | ICD-10-CM

## 2015-08-03 DIAGNOSIS — E876 Hypokalemia: Secondary | ICD-10-CM

## 2015-08-03 LAB — BASIC METABOLIC PANEL
Anion gap: 11 (ref 5–15)
BUN: 38 mg/dL — AB (ref 6–20)
CALCIUM: 9.2 mg/dL (ref 8.9–10.3)
CO2: 44 mmol/L — ABNORMAL HIGH (ref 22–32)
Chloride: 78 mmol/L — ABNORMAL LOW (ref 101–111)
Creatinine, Ser: 1.5 mg/dL — ABNORMAL HIGH (ref 0.44–1.00)
GFR calc Af Amer: 42 mL/min — ABNORMAL LOW (ref >60)
GFR, EST NON AFRICAN AMERICAN: 36 mL/min — AB (ref >60)
GLUCOSE: 135 mg/dL — AB (ref 65–99)
Potassium: 2.9 mmol/L — ABNORMAL LOW (ref 3.5–5.1)
Sodium: 133 mmol/L — ABNORMAL LOW (ref 135–145)

## 2015-08-03 MED ORDER — ASPIRIN 81 MG PO CHEW
81.0000 mg | CHEWABLE_TABLET | ORAL | Status: AC
  Administered 2015-08-04: 81 mg via ORAL
  Filled 2015-08-03: qty 1

## 2015-08-03 MED ORDER — SODIUM CHLORIDE 0.9 % IJ SOLN: 3.0000 mL | INTRAMUSCULAR | Status: DC | PRN

## 2015-08-03 MED ORDER — SODIUM CHLORIDE 0.9 % IV SOLN: 250.0000 mL | INTRAVENOUS | Status: DC | PRN

## 2015-08-03 MED ORDER — POTASSIUM CHLORIDE CRYS ER 20 MEQ PO TBCR
40.0000 meq | EXTENDED_RELEASE_TABLET | Freq: Once | ORAL | Status: AC
  Administered 2015-08-03: 40 meq via ORAL

## 2015-08-03 MED ORDER — SODIUM CHLORIDE 0.9 % IJ SOLN: 3.0000 mL | Freq: Two times a day (BID) | INTRAMUSCULAR | Status: DC

## 2015-08-03 MED ORDER — POTASSIUM CHLORIDE CRYS ER 10 MEQ PO TBCR
EXTENDED_RELEASE_TABLET | ORAL | Status: AC
  Filled 2015-08-03: qty 4

## 2015-08-03 MED ORDER — POTASSIUM CHLORIDE CRYS ER 20 MEQ PO TBCR
40.0000 meq | EXTENDED_RELEASE_TABLET | Freq: Once | ORAL | Status: AC
  Administered 2015-08-03: 40 meq via ORAL
  Filled 2015-08-03: qty 2

## 2015-08-03 MED ORDER — ASPIRIN 81 MG PO CHEW: 81.0000 mg | CHEWABLE_TABLET | ORAL | Status: DC

## 2015-08-03 MED ORDER — SODIUM CHLORIDE 0.9 % WEIGHT BASED INFUSION
1.0000 mL/kg/h | INTRAVENOUS | Status: DC
  Administered 2015-08-03 – 2015-08-04 (×2): 1 mL/kg/h via INTRAVENOUS

## 2015-08-03 MED ORDER — SODIUM CHLORIDE 0.9 % IV SOLN: INTRAVENOUS | Status: DC

## 2015-08-03 NOTE — Progress Notes (Signed)
Md notified of pt's potassium level of 2.9.  Pt had one 7 beat run VT asymptomatic.  New orders received. Will continue to monitor [facility] T

## 2015-08-03 NOTE — Progress Notes (Signed)
Advanced Heart Failure Rounding Note  PCP:  Primary Cardiologist: Dr. Haroldine Laws   Subjective:    Admitted from clinic 07/29/15 with volume overload and bendopnea/orthopnea.  Switched to po diuretics yesterday. Weight down another 2 pounds. (18 pounds total). CT of head and neck shows large parotid tumor. Creatinine up slightly. k 2.9 CVP 8   Echo 11/23 LVEF 55-60% RV severely dilated and HK RVSP 146mHG  Objective:   Weight Range: 105.461 kg (232 lb 8 oz) Body mass index is 41.82 kg/(m^2).   Vital Signs:   Temp:  [97.9 F (36.6 C)-98.5 F (36.9 C)] 97.9 F (36.6 C) (11/27 0739) Pulse Rate:  [84-96] 88 (11/27 0739) Resp:  [15-25] 25 (11/27 0739) BP: (88-118)/(43-66) 99/57 mmHg (11/27 0739) SpO2:  [92 %-100 %] 99 % (11/27 0739) Weight:  [105.461 kg (232 lb 8 oz)] 105.461 kg (232 lb 8 oz) (11/27 0333) Last BM Date: 07/30/15  Weight change: Filed Weights   08/01/15 0500 08/02/15 0203 08/03/15 0333  Weight: 105.688 kg (233 lb) 106.55 kg (234 lb 14.4 oz) 105.461 kg (232 lb 8 oz)    Intake/Output:   Intake/Output Summary (Last 24 hours) at 08/03/15 1122 Last data filed at 08/03/15 0900  Gross per 24 hour  Intake    610 ml  Output   2075 ml  Net  -1465 ml     Physical Exam: General:  Chronically ill appearing. 2 liters [place] HEENT: normal Neck: supple. JVP difficult (thick). Carotids 2+ bilat; no bruits. No thyromegaly or nodule noted. Cystic fullness under left mandible Cor: PMI nonpalpable. RRR. Increased P2. Soft TR, no RV lift Lungs: Diminished throughout Abdomen: Obese,  soft, nontender, mildly distended. No HSM. No bruits or masses. +BS Extremities: no cyanosis, clubbing, rash. Tr-1+ edema L>R TED hose in place.  Neuro: alert & orientedx3, cranial nerves grossly intact. moves all 4 extremities w/o difficulty. Affect pleasant  Telemetry: NSR 90s, Occasional PVCs  Labs: CBC No results for input(s): WBC, NEUTROABS, HGB, HCT, MCV, PLT in the last 72 hours. Basic  Metabolic Panel  Recent Labs  08/02/15 0450 08/03/15 0410  NA 133* 133*  K 4.2 2.9*  CL 82* 78*  CO2 44* 44*  GLUCOSE 113* 135*  BUN 27* 38*  CREATININE 1.22* 1.50*  CALCIUM 9.2 9.2   Liver Function Tests No results for input(s): AST, ALT, ALKPHOS, BILITOT, PROT, ALBUMIN in the last 72 hours. No results for input(s): LIPASE, AMYLASE in the last 72 hours. Cardiac Enzymes No results for input(s): CKTOTAL, CKMB, CKMBINDEX, TROPONINI in the last 72 hours.  BNP: BNP (last 3 results)  Recent Labs  02/11/15 1050 07/29/15 1500  BNP 135.7* 340.6*    ProBNP (last 3 results) No results for input(s): PROBNP in the last 8760 hours.   D-Dimer No results for input(s): DDIMER in the last 72 hours. Hemoglobin A1C No results for input(s): HGBA1C in the last 72 hours. Fasting Lipid Panel No results for input(s): CHOL, HDL, LDLCALC, TRIG, CHOLHDL, LDLDIRECT in the last 72 hours. Thyroid Function Tests No results for input(s): TSH, T4TOTAL, T3FREE, THYROIDAB in the last 72 hours.  Invalid input(s): FREET3  Other results:     Imaging/Studies:  Ct Head Wo Contrast  08/02/2015  CLINICAL DATA:  Sinus infection, knot below left ear EXAM: CT HEAD WITHOUT CONTRAST TECHNIQUE: Contiguous axial images were obtained from the base of the skull through the vertex without intravenous contrast. COMPARISON:  None. FINDINGS: No skull fracture is noted. Paranasal sinuses and mastoid air cells are  unremarkable. There is right deviation of nasal bony septum. No intracranial hemorrhage, mass effect or midline shift. Mild cerebral atrophy. No acute cortical infarction. No mass lesion is noted on this unenhanced scan. There is partially visualized nodular mass in left parotid region measures at least 2.7 cm. IMPRESSION: 1. No acute intracranial abnormality. Mild cerebral atrophy. Partially visualized nodular mass in left parotid region measures 2.7 cm. Electronically Signed   By: Lahoma Crocker M.D.   On:  08/02/2015 17:53   Ct Soft Tissue Neck Wo Contrast  08/02/2015  CLINICAL DATA:  Left-sided neck mass. Bloody nasal secretions. Sinus infection. EXAM: CT NECK WITHOUT CONTRAST TECHNIQUE: Multidetector CT imaging of the neck was performed following the standard protocol without intravenous contrast. COMPARISON:  None. FINDINGS: The brain is more fully evaluated on concurrent head CT. Trace mucosal thickening or a tiny mucous retention cyst is noted in the alveolar recess of the left maxillary sinus. There is prominent rightward nasal septal deviation. The visualized mastoid air cells are clear. The nasopharynx, oropharynx, oral cavity, hypopharynx, and larynx are unremarkable allowing for lack of IV contrast and mild respiratory motion artifact without mass identified. The submandibular glands and right parotid gland are unremarkable. There is a well-circumscribed mass in the left parotid gland which measures 3.2 x 2.6 x 4.0 cm and involves both the superficial and deep aspects of the gland. The mass largely demonstrates homogeneous intermediate soft tissue density, although there is a 5 mm rounded focus of lower density in the more inferomedial aspect of the mass. The left parotid gland is otherwise unremarkable. Stylomastoid foramina are symmetric and unremarkable. No enlarged cervical lymph nodes are identified. Mild atherosclerotic calcification is present at the left greater than right carotid bifurcations. There is a partially medialized course of the carotid arteries. Visualized lung apices are free of consolidation although there is a suggestion of mosaic attenuation which may reflect areas of air trapping given history of asthma. A right PICC is partially visualized. Moderate disc degeneration is present throughout the cervical spine. IMPRESSION: 1. 4 cm left parotid mass most likely reflecting primary parotid neoplasm. 2. No enlarged cervical lymph nodes. Electronically Signed   By: Logan Bores M.D.    On: 08/02/2015 18:42     Medications:     Scheduled Medications: . aspirin EC  81 mg Oral Daily  . enoxaparin (LOVENOX) injection  50 mg Subcutaneous Q24H  . Macitentan  10 mg Oral Daily  . pantoprazole  40 mg Oral Daily  . potassium chloride  20 mEq Oral BID  . senna-docusate  1 tablet Oral BID  . sodium chloride  10-40 mL Intracatheter Q12H  . sodium chloride  10-40 mL Intracatheter Q12H  . sodium chloride  3 mL Intravenous Q12H  . spironolactone  25 mg Oral Daily  . Tadalafil (PAH)  40 mg Oral Daily  . torsemide  40 mg Oral BID    Infusions:    PRN Medications: sodium chloride, acetaminophen, albuterol, ketorolac, ondansetron (ZOFRAN) IV, oxyCODONE-acetaminophen, sodium chloride, sodium chloride, sodium chloride, traMADol   Assessment/Plan   1) Acute on chronic diastolic HF:  2) PAH: Mixed.Severe COPD as component of group 3 PAH but also concern for group 1 PAH. Stable class III symptoms.  - PA pressures ~149mHG by echo.  - Continue currently macitentan and Adcirca. She has been unable to get Tyvaso.  - Plan RHC Monday am - Continue O2 and nightly CPAP. - May be candidate for selexapeg down the road - Had been on  warfarin for [facility]. Stopped several months ago with rectal bleeding.  On Lovenox for VTE prophylaxis 3) COPD on home O2: continuous 2 liters [place].  4) OSA 5) Morbid obesity  6) Hypokalemia 7) Left parotid tumor - newly diagnosed on CT 11/26 8) Headache - will repeat toradol x 1 9) Deconditioning - have CR see.  10) Hypokalemia  Back on po diuretics. Renal function slightly worse today. Will hold diuretics today. Supp K+. Plan cath in am. Given possible need for surgery will do R/L.  I discussed parotid tumor with Dr. Benjamine Mola (ENT). It is very large deep tumor. Most of these usually benign but can degenerate. Recommends resection but she is not operative candidate at this point. Will ask IR to do FNA tomorrow for clearer diagnosis. He will see her in f/u  in his [place] office with potential referral to [place] [facility].   Liticia Gasior,MD 11:22 AM Advanced Heart Failure Team Pager [phone] (M-F; 7a - 4p)  Please contact [facility] for night-coverage after hours (4p -7a ) and weekends on [email or web address]

## 2015-08-04 ENCOUNTER — Encounter (HOSPITAL_COMMUNITY): Payer: Self-pay | Admitting: Internal Medicine

## 2015-08-04 ENCOUNTER — Inpatient Hospital Stay (HOSPITAL_COMMUNITY): Payer: Medicare Other

## 2015-08-04 ENCOUNTER — Encounter (HOSPITAL_COMMUNITY)
Admission: AD | Disposition: A | Discharge status: Home / Self Care | Payer: Medicare Other | Source: Ambulatory Visit | Attending: Internal Medicine

## 2015-08-04 DIAGNOSIS — I2721 Secondary pulmonary arterial hypertension: Secondary | ICD-10-CM | POA: Insufficient documentation

## 2015-08-04 DIAGNOSIS — D490 Neoplasm of unspecified behavior of digestive system: Secondary | ICD-10-CM | POA: Insufficient documentation

## 2015-08-04 HISTORY — PX: CARDIAC CATHETERIZATION: SHX172

## 2015-08-04 LAB — BASIC METABOLIC PANEL
Anion gap: 8 (ref 5–15)
BUN: 39 mg/dL — AB (ref 6–20)
CALCIUM: 9.1 mg/dL (ref 8.9–10.3)
CHLORIDE: 82 mmol/L — AB (ref 101–111)
CO2: 45 mmol/L — AB (ref 22–32)
CREATININE: 1.1 mg/dL — AB (ref 0.44–1.00)
GFR calc non Af Amer: 53 mL/min — ABNORMAL LOW (ref >60)
GLUCOSE: 110 mg/dL — AB (ref 65–99)
Potassium: 3.3 mmol/L — ABNORMAL LOW (ref 3.5–5.1)
Sodium: 135 mmol/L (ref 135–145)

## 2015-08-04 LAB — POCT I-STAT 3, VENOUS BLOOD GAS (G3P V)
ACID-BASE EXCESS: 16 mmol/L — AB (ref 0.0–2.0)
Acid-Base Excess: 18 mmol/L — ABNORMAL HIGH (ref 0.0–2.0)
BICARBONATE: 47 meq/L — AB (ref 20.0–24.0)
Bicarbonate: 45.3 mEq/L — ABNORMAL HIGH (ref 20.0–24.0)
O2 SAT: 66 %
O2 SAT: 67 %
PCO2 VEN: 71.9 mmHg — AB (ref 45.0–50.0)
TCO2: 47 mmol/L (ref 0–100)
TCO2: 49 mmol/L (ref 0–100)
pCO2, Ven: 73.6 mmHg (ref 45.0–50.0)
pH, Ven: 7.413 — ABNORMAL HIGH (ref 7.250–7.300)
pH, Ven: 7.407 — ABNORMAL HIGH (ref 7.250–7.300)
pO2, Ven: 36 mmHg (ref 30.0–45.0)
pO2, Ven: 37 mmHg (ref 30.0–45.0)

## 2015-08-04 LAB — CBC
HEMATOCRIT: 43 % (ref 36.0–46.0)
Hemoglobin: 12.3 g/dL (ref 12.0–15.0)
MCH: 24.3 pg — ABNORMAL LOW (ref 26.0–34.0)
MCHC: 28.6 g/dL — ABNORMAL LOW (ref 30.0–36.0)
MCV: 85 fL (ref 78.0–100.0)
Platelets: 233 10*3/uL (ref 150–400)
RBC: 5.06 MIL/uL (ref 3.87–5.11)
RDW: 17.5 % — AB (ref 11.5–15.5)
WBC: 8.4 10*3/uL (ref 4.0–10.5)

## 2015-08-04 LAB — POCT I-STAT 3, ART BLOOD GAS (G3+)
ACID-BASE EXCESS: 15 mmol/L — AB (ref 0.0–2.0)
BICARBONATE: 43.4 meq/L — AB (ref 20.0–24.0)
O2 Saturation: 96 %
TCO2: 45 mmol/L (ref 0–100)
pCO2 arterial: 65.3 mmHg (ref 35.0–45.0)
pH, Arterial: 7.43 (ref 7.350–7.450)
pO2, Arterial: 81 mmHg (ref 80.0–100.0)

## 2015-08-04 LAB — PROTIME-INR
INR: 1.18 (ref 0.00–1.49)
PROTHROMBIN TIME: 15.2 s (ref 11.6–15.2)

## 2015-08-04 SURGERY — RIGHT/LEFT HEART CATH AND CORONARY ANGIOGRAPHY
Anesthesia: LOCAL

## 2015-08-04 MED ORDER — LIDOCAINE HCL (PF) 1 % IJ SOLN
INTRAMUSCULAR | Status: DC | PRN
  Administered 2015-08-04: 5 mL via INTRADERMAL

## 2015-08-04 MED ORDER — MIDAZOLAM HCL 2 MG/2ML IJ SOLN
INTRAMUSCULAR | Status: AC
  Filled 2015-08-04: qty 2

## 2015-08-04 MED ORDER — FENTANYL CITRATE (PF) 100 MCG/2ML IJ SOLN
INTRAMUSCULAR | Status: AC | PRN
  Administered 2015-08-04: 25 ug via INTRAVENOUS

## 2015-08-04 MED ORDER — SODIUM CHLORIDE 0.9 % IJ SOLN: 3.0000 mL | Freq: Two times a day (BID) | INTRAMUSCULAR | Status: DC

## 2015-08-04 MED ORDER — FENTANYL CITRATE (PF) 100 MCG/2ML IJ SOLN
INTRAMUSCULAR | Status: AC
  Filled 2015-08-04: qty 2

## 2015-08-04 MED ORDER — HEPARIN SODIUM (PORCINE) 1000 UNIT/ML IJ SOLN
INTRAMUSCULAR | Status: DC | PRN
  Administered 2015-08-04: 5000 [IU] via INTRAVENOUS

## 2015-08-04 MED ORDER — LIDOCAINE HCL (PF) 1 % IJ SOLN
INTRAMUSCULAR | Status: AC
  Filled 2015-08-04: qty 30

## 2015-08-04 MED ORDER — SODIUM CHLORIDE 0.9 % IV SOLN
INTRAVENOUS | Status: AC
  Administered 2015-08-04: 50 mL/h via INTRAVENOUS

## 2015-08-04 MED ORDER — LIDOCAINE HCL (PF) 1 % IJ SOLN
INTRAMUSCULAR | Status: AC
  Filled 2015-08-04: qty 10

## 2015-08-04 MED ORDER — VERAPAMIL HCL 2.5 MG/ML IV SOLN
INTRAVENOUS | Status: AC
  Filled 2015-08-04: qty 2

## 2015-08-04 MED ORDER — HEPARIN (PORCINE) IN NACL 2-0.9 UNIT/ML-% IJ SOLN
INTRAMUSCULAR | Status: AC
  Filled 2015-08-04: qty 1500

## 2015-08-04 MED ORDER — SODIUM CHLORIDE 0.9 % IJ SOLN: 3.0000 mL | INTRAMUSCULAR | Status: DC | PRN

## 2015-08-04 MED ORDER — SODIUM CHLORIDE 0.9 % IV SOLN: 250.0000 mL | INTRAVENOUS | Status: DC | PRN

## 2015-08-04 MED ORDER — ONDANSETRON HCL 4 MG/2ML IJ SOLN: 4.0000 mg | Freq: Four times a day (QID) | INTRAMUSCULAR | Status: DC | PRN

## 2015-08-04 MED ORDER — MIDAZOLAM HCL 2 MG/2ML IJ SOLN
INTRAMUSCULAR | Status: AC | PRN
  Administered 2015-08-04: 0.5 mg via INTRAVENOUS

## 2015-08-04 MED ORDER — HEPARIN SODIUM (PORCINE) 1000 UNIT/ML IJ SOLN
INTRAMUSCULAR | Status: AC
  Filled 2015-08-04: qty 1

## 2015-08-04 MED ORDER — TORSEMIDE 20 MG PO TABS
40.0000 mg | ORAL_TABLET | Freq: Two times a day (BID) | ORAL | Status: DC
Start: 2015-08-04 — End: 2015-08-05
  Administered 2015-08-04 – 2015-08-05 (×2): 40 mg via ORAL
  Filled 2015-08-04 (×2): qty 2

## 2015-08-04 MED ORDER — ACETAMINOPHEN 325 MG PO TABS: 650.0000 mg | ORAL_TABLET | ORAL | Status: DC | PRN

## 2015-08-04 MED ORDER — VERAPAMIL HCL 2.5 MG/ML IV SOLN
INTRAVENOUS | Status: DC | PRN
  Administered 2015-08-04: 08:00:00 via INTRA_ARTERIAL

## 2015-08-04 MED ORDER — SODIUM CHLORIDE 0.9 % IV SOLN
INTRAVENOUS | Status: AC | PRN
Start: 2015-08-04 — End: 2015-08-04
  Administered 2015-08-04: 10 mL/h via INTRAVENOUS

## 2015-08-04 SURGICAL SUPPLY — 15 items
CATH INFINITI 5FR MULTPACK ANG (CATHETERS) ×2 IMPLANT
CATH SWAN GANZ 7F STRAIGHT (CATHETERS) ×2 IMPLANT
COVER PRB 48X5XTLSCP FOLD TPE (BAG) ×1 IMPLANT
COVER PROBE 5X48 (BAG) ×1
DEVICE RAD COMP TR BAND LRG (VASCULAR PRODUCTS) ×2 IMPLANT
GLIDESHEATH SLEND SS 6F .021 (SHEATH) ×2 IMPLANT
KIT HEART LEFT (KITS) ×2 IMPLANT
KIT HEART RIGHT NAMIC (KITS) ×2 IMPLANT
PACK CARDIAC CATHETERIZATION (CUSTOM PROCEDURE TRAY) ×2 IMPLANT
SHEATH PINNACLE 7F 10CM (SHEATH) ×2 IMPLANT
SYR MEDRAD MARK V 150ML (SYRINGE) ×2 IMPLANT
TRANSDUCER W/STOPCOCK (MISCELLANEOUS) ×4 IMPLANT
TUBING CIL FLEX 10 FLL-RA (TUBING) ×2 IMPLANT
WIRE HI TORQ VERSACORE-J 145CM (WIRE) ×2 IMPLANT
WIRE SAFE-T 1.5MM-J .035X260CM (WIRE) ×2 IMPLANT

## 2015-08-04 NOTE — H&P (Signed)
Chief Complaint: Patient was seen in consultation today for left parotid mass at the request of Dr. Haroldine Laws  Referring Physician(s): Dr. Haroldine Laws  History of Present Illness: Alexis [name] is a 59 y.o. female with COPD, PAH, diastolic heart failure recently admitted for IV diuresis and underwent right/left heart catheterization today. The patient c/o left neck mass that she states has been there for approximately 2 years and CT done this admission revealed left parotid mass. IR received request for biopsy. She denies any chest pain, states her shortness of breath has improved. She denies any active signs of bleeding or excessive bruising. She has previously tolerated sedation without complications.    Past Medical History  Diagnosis Date  . SOB (shortness of breath) on exertion   . Other chronic pulmonary heart diseases   . Chronic obstructive asthma with exacerbation (HCC)     CT scan was a pattern but no evidence of pulmonary embolus and., Negative VQ scan July 2012 pulmonary function test FVC 1.17 L per minute 37% of predicted, FEV1 1 L 40% of predicted. DLCO 61% of predicted no response to bronchodilators negative HIV serology, ANA in scleroderma.  . Tobacco use disorder   . Obesity, unspecified   . Other and unspecified hyperlipidemia   . Cardiomegaly     Normal LV systolic function ejection fraction 55-60% with moderate decrease in RV function.  . Other diseases of lung, not elsewhere classified   . Hepatomegaly   . Body mass index 40.0-44.9, adult ([place])   . Edema   . Pulmonary arterial hypertension (HCC)      PA pressure 112/54 mmHg the mean of 74 mmHg, right atrial pressure mean 25 mmHg. Pulmonary vascular resistance 13 with units. Cardiac output 5.8 L. Normal coronary arteries with normal LV function July 2012   . Obstructive sleep apnea     BiPAP initiated July 2012    Past Surgical History  Procedure Laterality Date  . Cesarean section    . Tubal  ligation    . Dilation and curettage of uterus    . Cardiac catheterization  04/02/2011  . Right heart catheterization N/A 10/06/2012    Procedure: RIGHT HEART CATH;  Surgeon: Jolaine Artist, MD;  Location: [facility] CATH LAB;  Service: Cardiovascular;  Laterality: N/A;  . Cardiac catheterization N/A 08/04/2015    Procedure: Right/Left Heart Cath and Coronary Angiography;  Surgeon: Jolaine Artist, MD;  Location: [facility] CV LAB;  Service: Cardiovascular;  Laterality: N/A;    Allergies: Bactrim; Morphine and related; Penicillins; and Sulfa antibiotics  Medications: Prior to Admission medications   Medication Sig Start Date End Date Taking? Authorizing Provider  acetaminophen (TYLENOL) 500 MG tablet Take 1,000 mg by mouth every 6 (six) hours as needed. For pain   Yes Historical Provider, MD  ADCIRCA 20 MG TABS Take 2 tablets by mouth  daily 06/18/15  Yes Jolaine Artist, MD  albuterol (PROVENTIL HFA;VENTOLIN HFA) 108 (90 BASE) MCG/ACT inhaler Inhale 2 puffs into the lungs every 4 (four) hours as needed for wheezing or shortness of breath.   Yes Historical Provider, MD  aspirin 81 MG EC tablet Take 81 mg by mouth daily.     Yes Historical Provider, MD  Aspirin-Acetaminophen-Caffeine (GOODY HEADACHE PO) Take 1 Package by mouth.   Yes Historical Provider, MD  aspirin-acetaminophen-caffeine (PAMPRIN MAX) [phone] MG tablet Take 1 tablet by mouth every 6 (six) hours as needed for headache.   Yes Historical Provider, MD  Macitentan (OPSUMIT)  10 MG TABS Take 10 mg by mouth daily. 10/08/13  Yes Jolaine Artist, MD  metolazone (ZAROXOLYN) 2.5 MG tablet Take 1 tablet (2.5 mg total) by mouth 2 (two) times a week. Tuesdays & Fridays 02/11/15  Yes Amy D Clegg, NP  omeprazole (PRILOSEC) 20 MG capsule Take 20 mg by mouth daily.  12/07/12  Yes Jolaine Artist, MD  potassium chloride SA (K-DUR,KLOR-CON) 20 MEQ tablet Take 2 tablets (40 mEq total) by mouth 2 (two) times daily. Take extra 40 meq (2  tablets) on Tuesdays and Fridays. 02/11/15  Yes Amy D Clegg, NP  spironolactone (ALDACTONE) 25 MG tablet Take 1 tablet (25 mg total) by mouth daily. 11/14/14  Yes Jolaine Artist, MD  torsemide (DEMADEX) 20 MG tablet Take 2 tablets (40 mg total) by mouth 2 (two) times daily. 06/19/14  Yes Larey Dresser, MD  traMADol (ULTRAM) 50 MG tablet Take 50 mg by mouth every 12 (twelve) hours as needed for moderate pain.   Yes Historical Provider, MD     Family History  Problem Relation Age of Onset  . Hypertension Mother   . Heart failure Brother   . Breast cancer Mother   . Brain cancer Maternal Uncle   . Brain cancer Maternal Uncle   . Cervical cancer Maternal Aunt   . Stroke Other     great Maternal grandmother  . Stroke Maternal Grandmother   . Alcohol abuse Father   . Sudden death Father     Social History   Social History  . Marital Status: Married    Spouse Name: N/A  . Number of Children: N/A  . Years of Education: N/A   Occupational History  . disabled    Social History Main Topics  . Smoking status: Former Smoker -- 1.00 packs/day for 40 years    Types: Cigarettes    Quit date: 03/30/2011  . Smokeless tobacco: Never Used  . Alcohol Use: No  . Drug Use: No  . Sexual Activity: Not Asked   Other Topics Concern  . None   Social History Narrative    Review of Systems: A 12 point ROS discussed and pertinent positives are indicated in the HPI above.  All other systems are negative.  Review of Systems  Vital Signs: BP 117/57 mmHg  Pulse 87  Temp(Src) 98.1 F (36.7 C) (Oral)  Resp 25  Ht 5' 2.5" (1.588 m)  Wt 233 lb 6.4 oz (105.87 kg)  BMI 41.98 kg/m2  SpO2 93%  Physical Exam  Constitutional: She is oriented to person, place, and time. No distress.  Cardiovascular: Normal rate and regular rhythm.  Exam reveals no gallop and no friction rub.   No murmur heard. Pulmonary/Chest: Effort normal and breath sounds normal. No respiratory distress. She has no wheezes.  She has no rales.  Abdominal: Soft. Bowel sounds are normal.  Neurological: She is alert and oriented to person, place, and time.  Skin: She is not diaphoretic.    Mallampati Score:  MD Evaluation Airway: WNL Airway comments: 3 Heart: WNL Heart  comments: Severe PAH Abdomen: WNL Chest/ Lungs: WNL ASA  Classification: 3 Mallampati/Airway Score: Two  Imaging: Ct Head Wo Contrast  08/02/2015  CLINICAL DATA:  Sinus infection, knot below left ear EXAM: CT HEAD WITHOUT CONTRAST TECHNIQUE: Contiguous axial images were obtained from the base of the skull through the vertex without intravenous contrast. COMPARISON:  None. FINDINGS: No skull fracture is noted. Paranasal sinuses and mastoid air cells are unremarkable. There  is right deviation of nasal bony septum. No intracranial hemorrhage, mass effect or midline shift. Mild cerebral atrophy. No acute cortical infarction. No mass lesion is noted on this unenhanced scan. There is partially visualized nodular mass in left parotid region measures at least 2.7 cm. IMPRESSION: 1. No acute intracranial abnormality. Mild cerebral atrophy. Partially visualized nodular mass in left parotid region measures 2.7 cm. Electronically Signed   By: Lahoma Crocker M.D.   On: 08/02/2015 17:53   Ct Soft Tissue Neck Wo Contrast  08/02/2015  CLINICAL DATA:  Left-sided neck mass. Bloody nasal secretions. Sinus infection. EXAM: CT NECK WITHOUT CONTRAST TECHNIQUE: Multidetector CT imaging of the neck was performed following the standard protocol without intravenous contrast. COMPARISON:  None. FINDINGS: The brain is more fully evaluated on concurrent head CT. Trace mucosal thickening or a tiny mucous retention cyst is noted in the alveolar recess of the left maxillary sinus. There is prominent rightward nasal septal deviation. The visualized mastoid air cells are clear. The nasopharynx, oropharynx, oral cavity, hypopharynx, and larynx are unremarkable allowing for lack of IV  contrast and mild respiratory motion artifact without mass identified. The submandibular glands and right parotid gland are unremarkable. There is a well-circumscribed mass in the left parotid gland which measures 3.2 x 2.6 x 4.0 cm and involves both the superficial and deep aspects of the gland. The mass largely demonstrates homogeneous intermediate soft tissue density, although there is a 5 mm rounded focus of lower density in the more inferomedial aspect of the mass. The left parotid gland is otherwise unremarkable. Stylomastoid foramina are symmetric and unremarkable. No enlarged cervical lymph nodes are identified. Mild atherosclerotic calcification is present at the left greater than right carotid bifurcations. There is a partially medialized course of the carotid arteries. Visualized lung apices are free of consolidation although there is a suggestion of mosaic attenuation which may reflect areas of air trapping given history of asthma. A right PICC is partially visualized. Moderate disc degeneration is present throughout the cervical spine. IMPRESSION: 1. 4 cm left parotid mass most likely reflecting primary parotid neoplasm. 2. No enlarged cervical lymph nodes. Electronically Signed   By: Logan Bores M.D.   On: 08/02/2015 18:42    Labs:  CBC:  Recent Labs  07/30/15 0825 08/04/15 0330  WBC 7.4 8.4  HGB 11.6* 12.3  HCT 43.5 43.0  PLT 251 233    COAGS:  Recent Labs  07/29/15 1500 08/04/15 0330  INR 1.17 1.18    BMP:  Recent Labs  08/01/15 0517 08/02/15 0450 08/03/15 0410 08/04/15 0330  NA 135 133* 133* 135  K 3.7 4.2 2.9* 3.3*  CL 83* 82* 78* 82*  CO2 42* 44* 44* 45*  GLUCOSE 109* 113* 135* 110*  BUN 16 27* 38* 39*  CALCIUM 9.1 9.2 9.2 9.1  CREATININE 1.14* 1.22* 1.50* 1.10*  GFRNONAA 51* 47* 36* 53*  GFRAA 59* 54* 42* >60    LIVER FUNCTION TESTS:  Recent Labs  07/29/15 1500  BILITOT 1.0  AST 29  ALT 28  ALKPHOS 146*  PROT 7.0  ALBUMIN 3.5     Assessment and Plan: Left neck mass, CT 08/02/15 with 4 cm left parotid mass Request for biopsy, will proceed today with image guided biopsy with sedation The patient has been NPO, no blood thinners taken, labs and vitals have been reviewed. Risks and Benefits discussed with the patient including, but not limited to bleeding, infection, damage to adjacent structures or low yield requiring additional tests.  All of the patient's questions were answered, patient is agreeable to proceed. Consent signed and in chart. Acute on chronic diastolic heart failure PAH S/p right and left heart catheterization  COPD on continuous 2L O2 OSA   Thank you for this interesting consult.  I greatly enjoyed meeting Alexis [name] and look forward to participating in their care.  A copy of this report was sent to the requesting provider on this date.  SignedHedy Jacob 08/04/2015, 10:47 AM   I spent a total of 20 Minutes in face to face in clinical consultation, greater than 50% of which was counseling/coordinating care for left parotid mass.

## 2015-08-04 NOTE — Care Management Important Message (Signed)
Important Message  Patient Details  Name: Alexis [name] MRN: [id number] Date of Birth: June 25, 1956   Medicare Important Message Given:  Yes    Barb Merino Noe Pittsley 08/04/2015, 1:09 PM

## 2015-08-04 NOTE — Progress Notes (Signed)
Bandaid to left parotid area  dry and intact. Ice pack in placed. Denied any discomfort. Continue to monitor.

## 2015-08-04 NOTE — Progress Notes (Signed)
CARDIAC REHAB PHASE I   Pt declined ambulating as she is waiting for IR. Reviewed HF with her and gave HF booklet (she stated she did not have). She was cognizant of 2 g sodium restrictions and sts she keeps record of what she eats at home. Encouraged her to find a place for her scale to weight daily and to walk daily. Voiced understanding. [place], [place], ACSM 08/04/2015 1:23 PM

## 2015-08-04 NOTE — H&P (View-Only) (Signed)
Advanced Heart Failure Rounding Note  PCP:  Primary Cardiologist: Dr. Haroldine Laws   Subjective:    Admitted from clinic 07/29/15 with volume overload and bendopnea/orthopnea.  Switched to po diuretics yesterday. Weight down another 2 pounds. (18 pounds total). CT of head and neck shows large parotid tumor. Creatinine up slightly. k 2.9 CVP 8   Echo 11/23 LVEF 55-60% RV severely dilated and HK RVSP 175mHG  Objective:   Weight Range: 105.461 kg (232 lb 8 oz) Body mass index is 41.82 kg/(m^2).   Vital Signs:   Temp:  [97.9 F (36.6 C)-98.5 F (36.9 C)] 97.9 F (36.6 C) (11/27 0739) Pulse Rate:  [84-96] 88 (11/27 0739) Resp:  [15-25] 25 (11/27 0739) BP: (88-118)/(43-66) 99/57 mmHg (11/27 0739) SpO2:  [92 %-100 %] 99 % (11/27 0739) Weight:  [105.461 kg (232 lb 8 oz)] 105.461 kg (232 lb 8 oz) (11/27 0333) Last BM Date: 07/30/15  Weight change: Filed Weights   08/01/15 0500 08/02/15 0203 08/03/15 0333  Weight: 105.688 kg (233 lb) 106.55 kg (234 lb 14.4 oz) 105.461 kg (232 lb 8 oz)    Intake/Output:   Intake/Output Summary (Last 24 hours) at 08/03/15 1122 Last data filed at 08/03/15 0900  Gross per 24 hour  Intake    610 ml  Output   2075 ml  Net  -1465 ml     Physical Exam: General:  Chronically ill appearing. 2 liters [place] HEENT: normal Neck: supple. JVP difficult (thick). Carotids 2+ bilat; no bruits. No thyromegaly or nodule noted. Cystic fullness under left mandible Cor: PMI nonpalpable. RRR. Increased P2. Soft TR, no RV lift Lungs: Diminished throughout Abdomen: Obese,  soft, nontender, mildly distended. No HSM. No bruits or masses. +BS Extremities: no cyanosis, clubbing, rash. Tr-1+ edema L>R TED hose in place.  Neuro: alert & orientedx3, cranial nerves grossly intact. moves all 4 extremities w/o difficulty. Affect pleasant  Telemetry: NSR 90s, Occasional PVCs  Labs: CBC No results for input(s): WBC, NEUTROABS, HGB, HCT, MCV, PLT in the last 72 hours. Basic  Metabolic Panel  Recent Labs  08/02/15 0450 08/03/15 0410  NA 133* 133*  K 4.2 2.9*  CL 82* 78*  CO2 44* 44*  GLUCOSE 113* 135*  BUN 27* 38*  CREATININE 1.22* 1.50*  CALCIUM 9.2 9.2   Liver Function Tests No results for input(s): AST, ALT, ALKPHOS, BILITOT, PROT, ALBUMIN in the last 72 hours. No results for input(s): LIPASE, AMYLASE in the last 72 hours. Cardiac Enzymes No results for input(s): CKTOTAL, CKMB, CKMBINDEX, TROPONINI in the last 72 hours.  BNP: BNP (last 3 results)  Recent Labs  02/11/15 1050 07/29/15 1500  BNP 135.7* 340.6*    ProBNP (last 3 results) No results for input(s): PROBNP in the last 8760 hours.   D-Dimer No results for input(s): DDIMER in the last 72 hours. Hemoglobin A1C No results for input(s): HGBA1C in the last 72 hours. Fasting Lipid Panel No results for input(s): CHOL, HDL, LDLCALC, TRIG, CHOLHDL, LDLDIRECT in the last 72 hours. Thyroid Function Tests No results for input(s): TSH, T4TOTAL, T3FREE, THYROIDAB in the last 72 hours.  Invalid input(s): FREET3  Other results:     Imaging/Studies:  Ct Head Wo Contrast  08/02/2015  CLINICAL DATA:  Sinus infection, knot below left ear EXAM: CT HEAD WITHOUT CONTRAST TECHNIQUE: Contiguous axial images were obtained from the base of the skull through the vertex without intravenous contrast. COMPARISON:  None. FINDINGS: No skull fracture is noted. Paranasal sinuses and mastoid air cells are  unremarkable. There is right deviation of nasal bony septum. No intracranial hemorrhage, mass effect or midline shift. Mild cerebral atrophy. No acute cortical infarction. No mass lesion is noted on this unenhanced scan. There is partially visualized nodular mass in left parotid region measures at least 2.7 cm. IMPRESSION: 1. No acute intracranial abnormality. Mild cerebral atrophy. Partially visualized nodular mass in left parotid region measures 2.7 cm. Electronically Signed   By: Lahoma Crocker M.D.   On:  08/02/2015 17:53   Ct Soft Tissue Neck Wo Contrast  08/02/2015  CLINICAL DATA:  Left-sided neck mass. Bloody nasal secretions. Sinus infection. EXAM: CT NECK WITHOUT CONTRAST TECHNIQUE: Multidetector CT imaging of the neck was performed following the standard protocol without intravenous contrast. COMPARISON:  None. FINDINGS: The brain is more fully evaluated on concurrent head CT. Trace mucosal thickening or a tiny mucous retention cyst is noted in the alveolar recess of the left maxillary sinus. There is prominent rightward nasal septal deviation. The visualized mastoid air cells are clear. The nasopharynx, oropharynx, oral cavity, hypopharynx, and larynx are unremarkable allowing for lack of IV contrast and mild respiratory motion artifact without mass identified. The submandibular glands and right parotid gland are unremarkable. There is a well-circumscribed mass in the left parotid gland which measures 3.2 x 2.6 x 4.0 cm and involves both the superficial and deep aspects of the gland. The mass largely demonstrates homogeneous intermediate soft tissue density, although there is a 5 mm rounded focus of lower density in the more inferomedial aspect of the mass. The left parotid gland is otherwise unremarkable. Stylomastoid foramina are symmetric and unremarkable. No enlarged cervical lymph nodes are identified. Mild atherosclerotic calcification is present at the left greater than right carotid bifurcations. There is a partially medialized course of the carotid arteries. Visualized lung apices are free of consolidation although there is a suggestion of mosaic attenuation which may reflect areas of air trapping given history of asthma. A right PICC is partially visualized. Moderate disc degeneration is present throughout the cervical spine. IMPRESSION: 1. 4 cm left parotid mass most likely reflecting primary parotid neoplasm. 2. No enlarged cervical lymph nodes. Electronically Signed   By: Logan Bores M.D.    On: 08/02/2015 18:42     Medications:     Scheduled Medications: . aspirin EC  81 mg Oral Daily  . enoxaparin (LOVENOX) injection  50 mg Subcutaneous Q24H  . Macitentan  10 mg Oral Daily  . pantoprazole  40 mg Oral Daily  . potassium chloride  20 mEq Oral BID  . senna-docusate  1 tablet Oral BID  . sodium chloride  10-40 mL Intracatheter Q12H  . sodium chloride  10-40 mL Intracatheter Q12H  . sodium chloride  3 mL Intravenous Q12H  . spironolactone  25 mg Oral Daily  . Tadalafil (PAH)  40 mg Oral Daily  . torsemide  40 mg Oral BID    Infusions:    PRN Medications: sodium chloride, acetaminophen, albuterol, ketorolac, ondansetron (ZOFRAN) IV, oxyCODONE-acetaminophen, sodium chloride, sodium chloride, sodium chloride, traMADol   Assessment/Plan   1) Acute on chronic diastolic HF:  2) PAH: Mixed.Severe COPD as component of group 3 PAH but also concern for group 1 PAH. Stable class III symptoms.  - PA pressures ~140mHG by echo.  - Continue currently macitentan and Adcirca. She has been unable to get Tyvaso.  - Plan RHC Monday am - Continue O2 and nightly CPAP. - May be candidate for selexapeg down the road - Had been on  warfarin for AC. Stopped several months ago with rectal bleeding.  On Lovenox for VTE prophylaxis 3) COPD on home O2: continuous 2 liters [place].  4) OSA 5) Morbid obesity  6) Hypokalemia 7) Left parotid tumor - newly diagnosed on CT 11/26 8) Headache - will repeat toradol x 1 9) Deconditioning - have CR see.  10) Hypokalemia  Back on po diuretics. Renal function slightly worse today. Will hold diuretics today. Supp K+. Plan cath in am. Given possible need for surgery will do R/L.  I discussed parotid tumor with Dr. Benjamine Mola (ENT). It is very large deep tumor. Most of these usually benign but can degenerate. Recommends resection but she is not operative candidate at this point. Will ask IR to do FNA tomorrow for clearer diagnosis. He will see her in f/u  in his [place] office with potential referral to [place] at [facility].   Bensimhon, Daniel,MD 11:22 AM Advanced Heart Failure Team Pager [phone] (M-F; 7a - 4p)  Please contact [facility] for night-coverage after hours (4p -7a ) and weekends on [email or web address]

## 2015-08-04 NOTE — Interval H&P Note (Signed)
History and Physical Interval Note:  08/04/2015 7:51 AM  Alexis [name]  has presented today for surgery, with the diagnosis of chf  The various methods of treatment have been discussed with the patient and family. After consideration of risks, benefits and other options for treatment, the patient has consented to  Procedure(s): Right/Left Heart Cath and Coronary Angiography (N/A) and possible angioplasty as a surgical intervention .  The patient's history has been reviewed, patient examined, no change in status, stable for surgery.  I have reviewed the patient's chart and labs.  Questions were answered to the patient's satisfaction.     Bensimhon, Quillian Quince

## 2015-08-04 NOTE — Procedures (Signed)
L parotid mass Bx 18 g times three No comp/EBL

## 2015-08-04 NOTE — Progress Notes (Signed)
Advanced Heart Failure Rounding Note  PCP:  Primary Cardiologist: Dr. Haroldine Laws   Subjective:    Admitted from clinic 07/29/15 with volume overload and bendopnea/orthopnea.  Diuretics held yesterday afternoon. Weight up 1 pound. Creatinine 1.1. CVP 9-10   CT of head and neck shows large parotid tumor. Creatinine up slightly.   Echo 11/23 LVEF 55-60% RV severely dilated and HK RVSP 185mHG  Objective:   Weight Range: 105.87 kg (233 lb 6.4 oz) Body mass index is 41.98 kg/(m^2).   Vital Signs:   Temp:  [97.6 F (36.4 C)-98.1 F (36.7 C)] 98.1 F (36.7 C) (11/28 0643) Pulse Rate:  [80-93] 87 (11/28 0643) Resp:  [20-25] 25 (11/28 0643) BP: (81-117)/(49-73) 116/58 mmHg (11/28 0643) SpO2:  [91 %-98 %] 97 % (11/28 0643) Weight:  [105.87 kg (233 lb 6.4 oz)] 105.87 kg (233 lb 6.4 oz) (11/28 0448) Last BM Date: 08/03/15  Weight change: Filed Weights   08/02/15 0203 08/03/15 0333 08/04/15 0448  Weight: 106.55 kg (234 lb 14.4 oz) 105.461 kg (232 lb 8 oz) 105.87 kg (233 lb 6.4 oz)    Intake/Output:   Intake/Output Summary (Last 24 hours) at 08/04/15 0752 Last data filed at 08/04/15 0600  Gross per 24 hour  Intake 1744.23 ml  Output   1200 ml  Net 544.23 ml     Physical Exam: General:  Chronically ill appearing. 2 liters [place] HEENT: normal Neck: supple. JVP difficult (thick). Carotids 2+ bilat; no bruits. No thyromegaly or nodule noted. Cystic fullness under left mandible Cor: PMI nonpalpable. RRR. Increased P2. Soft TR, no RV lift Lungs: Diminished throughout Abdomen: Obese,  soft, nontender, mildly distended. No HSM. No bruits or masses. +BS Extremities: no cyanosis, clubbing, rash. Tr-1+ edema L>R TED hose in place.  Neuro: alert & orientedx3, cranial nerves grossly intact. moves all 4 extremities w/o difficulty. Affect pleasant  Telemetry: NSR 90s, Occasional PVCs  Labs: CBC  Recent Labs  08/04/15 0330  WBC 8.4  HGB 12.3  HCT 43.0  MCV 85.0  PLT 2630  Basic  Metabolic Panel  Recent Labs  08/03/15 0410 08/04/15 0330  NA 133* 135  K 2.9* 3.3*  CL 78* 82*  CO2 44* 45*  GLUCOSE 135* 110*  BUN 38* 39*  CREATININE 1.50* 1.10*  CALCIUM 9.2 9.1   Liver Function Tests No results for input(s): AST, ALT, ALKPHOS, BILITOT, PROT, ALBUMIN in the last 72 hours. No results for input(s): LIPASE, AMYLASE in the last 72 hours. Cardiac Enzymes No results for input(s): CKTOTAL, CKMB, CKMBINDEX, TROPONINI in the last 72 hours.  BNP: BNP (last 3 results)  Recent Labs  02/11/15 1050 07/29/15 1500  BNP 135.7* 340.6*    ProBNP (last 3 results) No results for input(s): PROBNP in the last 8760 hours.   D-Dimer No results for input(s): DDIMER in the last 72 hours. Hemoglobin A1C No results for input(s): HGBA1C in the last 72 hours. Fasting Lipid Panel No results for input(s): CHOL, HDL, LDLCALC, TRIG, CHOLHDL, LDLDIRECT in the last 72 hours. Thyroid Function Tests No results for input(s): TSH, T4TOTAL, T3FREE, THYROIDAB in the last 72 hours.  Invalid input(s): FREET3  Other results:     Imaging/Studies:  Ct Head Wo Contrast  08/02/2015  CLINICAL DATA:  Sinus infection, knot below left ear EXAM: CT HEAD WITHOUT CONTRAST TECHNIQUE: Contiguous axial images were obtained from the base of the skull through the vertex without intravenous contrast. COMPARISON:  None. FINDINGS: No skull fracture is noted. Paranasal sinuses and mastoid  air cells are unremarkable. There is right deviation of nasal bony septum. No intracranial hemorrhage, mass effect or midline shift. Mild cerebral atrophy. No acute cortical infarction. No mass lesion is noted on this unenhanced scan. There is partially visualized nodular mass in left parotid region measures at least 2.7 cm. IMPRESSION: 1. No acute intracranial abnormality. Mild cerebral atrophy. Partially visualized nodular mass in left parotid region measures 2.7 cm. Electronically Signed   By: Lahoma Crocker M.D.   On:  08/02/2015 17:53   Ct Soft Tissue Neck Wo Contrast  08/02/2015  CLINICAL DATA:  Left-sided neck mass. Bloody nasal secretions. Sinus infection. EXAM: CT NECK WITHOUT CONTRAST TECHNIQUE: Multidetector CT imaging of the neck was performed following the standard protocol without intravenous contrast. COMPARISON:  None. FINDINGS: The brain is more fully evaluated on concurrent head CT. Trace mucosal thickening or a tiny mucous retention cyst is noted in the alveolar recess of the left maxillary sinus. There is prominent rightward nasal septal deviation. The visualized mastoid air cells are clear. The nasopharynx, oropharynx, oral cavity, hypopharynx, and larynx are unremarkable allowing for lack of IV contrast and mild respiratory motion artifact without mass identified. The submandibular glands and right parotid gland are unremarkable. There is a well-circumscribed mass in the left parotid gland which measures 3.2 x 2.6 x 4.0 cm and involves both the superficial and deep aspects of the gland. The mass largely demonstrates homogeneous intermediate soft tissue density, although there is a 5 mm rounded focus of lower density in the more inferomedial aspect of the mass. The left parotid gland is otherwise unremarkable. Stylomastoid foramina are symmetric and unremarkable. No enlarged cervical lymph nodes are identified. Mild atherosclerotic calcification is present at the left greater than right carotid bifurcations. There is a partially medialized course of the carotid arteries. Visualized lung apices are free of consolidation although there is a suggestion of mosaic attenuation which may reflect areas of air trapping given history of asthma. A right PICC is partially visualized. Moderate disc degeneration is present throughout the cervical spine. IMPRESSION: 1. 4 cm left parotid mass most likely reflecting primary parotid neoplasm. 2. No enlarged cervical lymph nodes. Electronically Signed   By: Logan Bores M.D.    On: 08/02/2015 18:42     Medications:     Scheduled Medications: . aspirin EC  81 mg Oral Daily  . enoxaparin (LOVENOX) injection  50 mg Subcutaneous Q24H  . Macitentan  10 mg Oral Daily  . pantoprazole  40 mg Oral Daily  . potassium chloride  20 mEq Oral BID  . senna-docusate  1 tablet Oral BID  . sodium chloride  10-40 mL Intracatheter Q12H  . sodium chloride  10-40 mL Intracatheter Q12H  . spironolactone  25 mg Oral Daily  . Tadalafil (PAH)  40 mg Oral Daily    Infusions: . sodium chloride 1 mL/kg/hr (08/04/15 0524)    PRN Medications: sodium chloride, acetaminophen, albuterol, ketorolac, ondansetron (ZOFRAN) IV, oxyCODONE-acetaminophen, sodium chloride, sodium chloride, traMADol   Assessment/Plan   1) Acute on chronic diastolic HF:  2) PAH: Mixed.Severe COPD as component of group 3 PAH but also concern for group 1 PAH. Stable class III symptoms.  - PA pressures ~172mHG by echo.  - Continue currently macitentan and Adcirca. She has been unable to get Tyvaso.  - Plan RHC Monday am - Continue O2 and nightly CPAP. - May be candidate for selexapeg down the road - [facility] Sto[facility] several months ago with rectal  bleeding.  On Lovenox for VTE prophylaxis 3) COPD on home O2: continuous 2 liters [place].  4) OSA 5) Morbid obesity  6) Hypokalemia 7) Left parotid tumor - newly diagnosed on CT 11/26 8) Headache - will repeat toradol x 1 9) Deconditioning - have CR see.  10) Hypokalemia  Off diuretics for cath today. CVP 9-10. Breathing better. For R/L cath this am.   I discussed parotid tumor with Dr. Benjamine Mola (ENT) on 11/27. It is very large deep tumor. Most of these usually benign but can degenerate. Recommends resection but she is not operative candidate at this point. Will ask IR to do FNA later today or tomorrow for clearer diagnosis. He will see her in f/u in his [place] office with potential referral to [place] at [facility].    Bensimhon, Daniel,MD 7:52 AM Advanced Heart Failure Team Pager [phone] (M-F; 7a - 4p)  Please contact [facility] for night-coverage after hours (4p -7a ) and weekends on [email or web address]

## 2015-08-05 LAB — CBC WITH DIFFERENTIAL/PLATELET
BASOS ABS: 0 10*3/uL (ref 0.0–0.1)
Basophils Relative: 0 %
EOS PCT: 1 %
Eosinophils Absolute: 0.1 10*3/uL (ref 0.0–0.7)
HCT: 45.4 % (ref 36.0–46.0)
HEMOGLOBIN: 12.6 g/dL (ref 12.0–15.0)
LYMPHS PCT: 12 %
Lymphs Abs: 0.9 10*3/uL (ref 0.7–4.0)
MCH: 23.9 pg — ABNORMAL LOW (ref 26.0–34.0)
MCHC: 27.8 g/dL — ABNORMAL LOW (ref 30.0–36.0)
MCV: 86.1 fL (ref 78.0–100.0)
Monocytes Absolute: 0.6 10*3/uL (ref 0.1–1.0)
Monocytes Relative: 8 %
NEUTROS PCT: 79 %
Neutro Abs: 6.4 10*3/uL (ref 1.7–7.7)
PLATELETS: 225 10*3/uL (ref 150–400)
RBC: 5.27 MIL/uL — AB (ref 3.87–5.11)
RDW: 17.9 % — ABNORMAL HIGH (ref 11.5–15.5)
WBC: 8.1 10*3/uL (ref 4.0–10.5)

## 2015-08-05 LAB — BASIC METABOLIC PANEL
ANION GAP: 11 (ref 5–15)
BUN: 26 mg/dL — AB (ref 6–20)
CHLORIDE: 86 mmol/L — AB (ref 101–111)
CO2: 40 mmol/L — ABNORMAL HIGH (ref 22–32)
Calcium: 9.5 mg/dL (ref 8.9–10.3)
Creatinine, Ser: 0.86 mg/dL (ref 0.44–1.00)
Glucose, Bld: 150 mg/dL — ABNORMAL HIGH (ref 65–99)
POTASSIUM: 3.6 mmol/L (ref 3.5–5.1)
SODIUM: 137 mmol/L (ref 135–145)

## 2015-08-05 MED ORDER — POTASSIUM CHLORIDE CRYS ER 20 MEQ PO TBCR: 40.0000 meq | EXTENDED_RELEASE_TABLET | Freq: Two times a day (BID) | ORAL | Status: DC

## 2015-08-05 MED ORDER — TORSEMIDE 20 MG PO TABS: 40.0000 mg | ORAL_TABLET | Freq: Two times a day (BID) | ORAL | Status: DC

## 2015-08-05 MED ORDER — METOLAZONE 5 MG PO TABS: 5.0000 mg | ORAL_TABLET | ORAL | Status: DC | PRN

## 2015-08-05 MED FILL — Heparin Sodium (Porcine) 2 Unit/ML in Sodium Chloride 0.9%: INTRAMUSCULAR | Qty: 1000 | Status: AC

## 2015-08-05 NOTE — Discharge Summary (Addendum)
Advanced Heart Failure Team  Discharge Summary   Patient ID: Alexis [name] MRN: [id number], DOB/AGE: [birth year] 61 y.o. Admit date: 07/29/2015 D/C date:     08/05/2015   Primary Discharge Diagnoses:  1. Acute on chronic diastolic HF 2. Acute on chronic resp failure with severe COPD on home 02 3. PAH, Mixed. Group 3 with severe COPD and concerns for Group 1 PAH.  4. OSA 5. Morbid obesity 6. Left parotid tumor - New diagnosis, 08/02/15 via CT.  7. Hypokalemia  Hospital Course:   Alexis [name] is a 59 y.o. female with history of morbid obesty, severe COPD, mixed PAH, and diastolic CHF (ECHO 27/03/50 EF 55-60%, PA peak pressure 115 mm Hg, RV severely dilated). She was see in the HF clinic on 07/29/15 with marked volume overload and SOB with minimal activity and O2 desaturations into the 770-80s in clinic despite her home O2. Admitted for IV diuresis and further evaluation.   She began to feel better with IV lasix and metolazone. Repeat Echo 07/30/15 showed severe Pulmonary hypertension as above with PA peak pressure 115 mm Hg and RV severely dilated.  Diuresed well with IV lasix. Oxygenation improved.  On 08/02/15 left submandibular fullness was noted, that pt stated had been getting worse over the past few months. CT soft tissue neck w/o contrast on 11/26 noted 4 cm left parotid mass most likely reflecting primary parotid neoplasm. CT head w/o contrast 11/26 showed nodular mass measured 2.7 cm. US guided FNA performed 08/04/15. Results pending at time of discharge. Dr. Haroldine Laws discussed the case with Dr. Benjamine Mola and arranged follow-up. Pathology report currently pending.   [facility] 07/2815 showed that she had been diuresed adequately and had normal coronaries.  Also noted mod/severe pulmonary HTN with PA pressures in the 7s which was significant improvement since last catheterization. Full report as below.   Hospital course also complicated by HA that only responded to toradol.  Failed tramadol, percocet, and tylenol. Improved after dose of toradol x 2 days.   Overall she diuresed 14.1 L and down 20 lbs on IV lasix up to 80 mg BID with 2.5 mg of metolazone.  She will be discharged to home in stable condition with close follow up in the HF clinic as below. Will consider addition of selexipag to [facility] regimen as an outpatient. She will need to call Dr. Benjamine Mola and schedule follow up for further evaluation of her parotid tumor.   [facility] 08/04/15 RA = 14 RV = 70/11/16 PA = 73/31 (52) PCW = 15 Ao = 91/55 (68) LV = 99/6/20 Fick cardiac output/index = 5.5/2.7 PVR = 6.8 WU SVR = 791 dynes FA sat = 96% PA sat = 66%, 67%  Discharge Weight Range:  230 lb Discharge Vitals: Blood pressure 139/102, pulse 99, temperature 98.5 F (36.9 C), temperature source Oral, resp. rate 24, height 5' 2.5" (1.588 m), weight 230 lb 9.6 oz (104.6 kg), SpO2 90 %.  Labs: Lab Results  Component Value Date   WBC 8.1 08/05/2015   HGB 12.6 08/05/2015   HCT 45.4 08/05/2015   MCV 86.1 08/05/2015   PLT 225 08/05/2015    Recent Labs Lab 07/29/15 1500  08/05/15 0830  NA 139  < > 137  K 4.1  < > 3.6  CL 102  < > 86*  CO2 28  < > 40*  BUN 13  < > 26*  CREATININE 0.92  < > 0.86  CALCIUM 9.1  < > 9.5  PROT 7.0  --   --  BILITOT 1.0  --   --   ALKPHOS 146*  --   --   ALT 28  --   --   AST 29  --   --   GLUCOSE 91  < > 150*  < > = values in this interval not displayed. No results found for: CHOL, HDL, LDLCALC, TRIG BNP (last 3 results)  Recent Labs  02/11/15 1050 07/29/15 1500  BNP 135.7* 340.6*    ProBNP (last 3 results) No results for input(s): PROBNP in the last 8760 hours.   Diagnostic Studies/Procedures   US Biopsy  08/04/2015  CLINICAL DATA:  Left parotid mass EXAM: ULTRASOUND-GUIDED BIOPSY OF A LEFT PAROTID MASS.  CORE. MEDICATIONS AND MEDICAL HISTORY: Versed 0.5 mg, Fentanyl 25 mcg. Additional Medications: None. ANESTHESIA/SEDATION: Moderate sedation time: 10 minutes  PROCEDURE: The procedure, risks, benefits, and alternatives were explained to the patient. Questions regarding the procedure were encouraged and answered. The patient understands and consents to the procedure. The left neck was prepped with Betadine in a sterile fashion, and a sterile drape was applied covering the operative field. A sterile gown and sterile gloves were used for the procedure. Under sonographic guidance, 4 18 gauge core biopsies of the left parotid mass were obtained. After the biopsy, the patient was neurologically intact. Specifically, there is no evidence of facial nerve injury. Final imaging was performed. Patient tolerated the procedure well without complication. Vital sign monitoring by nursing staff during the procedure will continue as patient is in the special procedures unit for post procedure observation. FINDINGS: The images document guide needle placement within the left parotid mass. Post biopsy images demonstrate no hemorrhage. COMPLICATIONS: None. IMPRESSION: Successful ultrasound-guided core biopsy of a left parotid mass. Electronically Signed   By: Marybelle Killings M.D.   On: 08/04/2015 16:36    Discharge Medications     Medication List    TAKE these medications        acetaminophen 500 MG tablet  Commonly known as:  TYLENOL  Take 1,000 mg by mouth every 6 (six) hours as needed. For pain     ADCIRCA 20 MG Tabs  Generic drug:  Tadalafil (PAH)  Take 2 tablets by mouth  daily     albuterol 108 (90 BASE) MCG/ACT inhaler  Commonly known as:  PROVENTIL HFA;VENTOLIN HFA  Inhale 2 puffs into the lungs every 4 (four) hours as needed for wheezing or shortness of breath.     aspirin 81 MG EC tablet  Take 81 mg by mouth daily.     GOODY HEADACHE PO  Take 1 Package by mouth.     Macitentan 10 MG Tabs  Commonly known as:  OPSUMIT  Take 10 mg by mouth daily.     metolazone 5 MG tablet  Commonly known as:  ZAROXOLYN  Take 1 tablet (5 mg total) by mouth as needed. For  weights 235 or greater with an extra 20 meq of potassium.     omeprazole 20 MG capsule  Commonly known as:  PRILOSEC  Take 20 mg by mouth daily.     potassium chloride SA 20 MEQ tablet  Commonly known as:  K-DUR,KLOR-CON  Take 2 tablets (40 mEq total) by mouth 2 (two) times daily. Take extra 40 meq (2 tablets) on Tuesdays and Fridays.     spironolactone 25 MG tablet  Commonly known as:  ALDACTONE  Take 1 tablet (25 mg total) by mouth daily.     torsemide 20 MG tablet  Commonly known  as:  DEMADEX  Take 2 tablets (40 mg total) by mouth 2 (two) times daily.     traMADol 50 MG tablet  Commonly known as:  ULTRAM  Take 50 mg by mouth every 12 (twelve) hours as needed for moderate pain.        Disposition   The patient will be discharged in stable condition to home. Discharge Instructions    Diet - low sodium heart healthy    Complete by:  As directed      Heart Failure patients record your daily weight using the same scale at the same time of day    Complete by:  As directed      Increase activity slowly    Complete by:  As directed           Follow-up Information    Follow up with Glori Bickers, MD On 08/14/2015.   Specialty:  Cardiology   Why:  at 320 pm for post hospital follow up. Please bring all of your medications to your visit. The code for pt parking is 0009.   Contact information:   [place] [address] [phone]       Follow up with Ascencion Dike, MD. Schedule an appointment as soon as possible for a visit in 1 week.   Specialty:  Otolaryngology   Why:  Please call and schedule an appointment for further evaluation of your parotid tumor.   Contact information:   [address] [place] [address] [phone]         Duration of Discharge Encounter: Greater than 35 minutes   Signed, Shirley Friar PA-C 08/05/2015, 2:48 PM   Patient seen and examined with Oda Kilts, PA-C. We discussed all  aspects of the encounter. I agree with the assessment and plan as stated above.   I edited note with my changes.   Ashe Gago,MD 12:44 AM

## 2015-08-05 NOTE — Progress Notes (Addendum)
Advanced Heart Failure Rounding Note  PCP:  Primary Cardiologist: Dr. Haroldine Laws   Subjective:    Admitted from clinic 07/29/15 with volume overload and bendopnea/orthopnea.  Back on po diuretics, held yesterday for cath. Results of RHC below. PA pressures improved from previous. Weight down 3 lbs. BMET pending. CVP 7. S/p FNA of parotid tumor yesterday.   Echo 11/23 LVEF 55-60% RV severely dilated and HK RVSP 15mHG  [facility] = 14 RV = 70/11/16 PA = 73/31 (52) PCW = 15 Ao = 91/55 (68) LV = 99/6/20 Fick cardiac output/index = 5.5/2.7 PVR = 6.8 WU SVR = 791 dynes FA sat = 96% PA sat = 66%, 67%    Objective:   Weight Range: 230 lb 9.6 oz (104.6 kg) Body mass index is 41.48 kg/(m^2).   Vital Signs:   Temp:  [97.5 F (36.4 C)-98.9 F (37.2 C)] 98.8 F (37.1 C) (11/29 0500) Pulse Rate:  [0-142] 97 (11/29 0015) Resp:  [0-40] 16 (11/29 0015) BP: (91-145)/(23-93) 106/51 mmHg (11/29 0015) SpO2:  [0 %-100 %] 95 % (11/29 0015) Weight:  [230 lb 9.6 oz (104.6 kg)] 230 lb 9.6 oz (104.6 kg) (11/29 0500) Last BM Date: 08/04/15  Weight change: Filed Weights   08/03/15 0333 08/04/15 0448 08/05/15 0500  Weight: 232 lb 8 oz (105.461 kg) 233 lb 6.4 oz (105.87 kg) 230 lb 9.6 oz (104.6 kg)    Intake/Output:   Intake/Output Summary (Last 24 hours) at 08/05/15 0752 Last data filed at 08/05/15 0505  Gross per 24 hour  Intake    360 ml  Output   2351 ml  Net  -1991 ml     Physical Exam: General:  Chronically ill appearing. 2 liters [place] HEENT: normal Neck: supple. JVP difficult due to girth. Carotids 2+ bilat; no bruits. No thyromegaly or lymphadenopathy noted. Cystic fullness under left mandible, bandage in place. Cor: PMI nonpalpable. Regular rate and rhythm. Increased P2. Soft TR, no RV lift Lungs: Diminished diffusely Abdomen: Obese, soft, NT, mild distention. No HSM. No bruits or masses. Bowel sounds present. Extremities: no cyanosis, clubbing, rash. TED hose in  place. Trace ankle edema. Neuro: alert & orientedx3, cranial nerves grossly intact. moves all 4 extremities w/o difficulty. Affect pleasant  Telemetry: NSR 90s, Occasional PVCs  Labs: CBC  Recent Labs  08/04/15 0330  WBC 8.4  HGB 12.3  HCT 43.0  MCV 85.0  PLT 2017  Basic Metabolic Panel  Recent Labs  08/03/15 0410 08/04/15 0330  NA 133* 135  K 2.9* 3.3*  CL 78* 82*  CO2 44* 45*  GLUCOSE 135* 110*  BUN 38* 39*  CREATININE 1.50* 1.10*  CALCIUM 9.2 9.1   Liver Function Tests No results for input(s): AST, ALT, ALKPHOS, BILITOT, PROT, ALBUMIN in the last 72 hours. No results for input(s): LIPASE, AMYLASE in the last 72 hours. Cardiac Enzymes No results for input(s): CKTOTAL, CKMB, CKMBINDEX, TROPONINI in the last 72 hours.  BNP: BNP (last 3 results)  Recent Labs  02/11/15 1050 07/29/15 1500  BNP 135.7* 340.6*    ProBNP (last 3 results) No results for input(s): PROBNP in the last 8760 hours.   D-Dimer No results for input(s): DDIMER in the last 72 hours. Hemoglobin A1C No results for input(s): HGBA1C in the last 72 hours. Fasting Lipid Panel No results for input(s): CHOL, HDL, LDLCALC, TRIG, CHOLHDL, LDLDIRECT in the last 72 hours. Thyroid Function Tests No results for input(s): TSH, T4TOTAL, T3FREE, THYROIDAB in the last 72 hours.  Invalid input(s): FREET3  Other results:     Imaging/Studies:  US Biopsy  08/04/2015  CLINICAL DATA:  Left parotid mass EXAM: ULTRASOUND-GUIDED BIOPSY OF A LEFT PAROTID MASS.  CORE. MEDICATIONS AND MEDICAL HISTORY: Versed 0.5 mg, Fentanyl 25 mcg. Additional Medications: None. ANESTHESIA/SEDATION: Moderate sedation time: 10 minutes PROCEDURE: The procedure, risks, benefits, and alternatives were explained to the patient. Questions regarding the procedure were encouraged and answered. The patient understands and consents to the procedure. The left neck was prepped with Betadine in a sterile fashion, and a sterile drape was  applied covering the operative field. A sterile gown and sterile gloves were used for the procedure. Under sonographic guidance, 4 18 gauge core biopsies of the left parotid mass were obtained. After the biopsy, the patient was neurologically intact. Specifically, there is no evidence of facial nerve injury. Final imaging was performed. Patient tolerated the procedure well without complication. Vital sign monitoring by nursing staff during the procedure will continue as patient is in the special procedures unit for post procedure observation. FINDINGS: The images document guide needle placement within the left parotid mass. Post biopsy images demonstrate no hemorrhage. COMPLICATIONS: None. IMPRESSION: Successful ultrasound-guided core biopsy of a left parotid mass. Electronically Signed   By: Marybelle Killings M.D.   On: 08/04/2015 16:36     Medications:     Scheduled Medications: . aspirin EC  81 mg Oral Daily  . enoxaparin (LOVENOX) injection  50 mg Subcutaneous Q24H  . Macitentan  10 mg Oral Daily  . pantoprazole  40 mg Oral Daily  . potassium chloride  20 mEq Oral BID  . senna-docusate  1 tablet Oral BID  . sodium chloride  10-40 mL Intracatheter Q12H  . sodium chloride  10-40 mL Intracatheter Q12H  . sodium chloride  3 mL Intravenous Q12H  . spironolactone  25 mg Oral Daily  . Tadalafil (PAH)  40 mg Oral Daily  . torsemide  40 mg Oral BID    Infusions:    PRN Medications: sodium chloride, sodium chloride, acetaminophen, acetaminophen, albuterol, ketorolac, ondansetron (ZOFRAN) IV, ondansetron (ZOFRAN) IV, oxyCODONE-acetaminophen, sodium chloride, sodium chloride, sodium chloride, traMADol   Assessment/Plan   1) Acute on chronic diastolic HF:  2) PAH: Mixed.Severe COPD as component of group 3 PAH but also concern for group 1 PAH. Stable class III symptoms.  - PA 73/31 (52) PCW = 15, and PVR 6.8 WU by cath 08/04/15 - Consider selexapeg as outpatient - Continue currently  macitentan and Adcirca. She has been unable to get Tyvaso.  - Continue O2 and nightly CPAP. - Had been on warfarin for [facility]. Stopped several months ago with rectal bleeding.  On Lovenox for VTE prophylaxis 3) Acute on chronic resp failure with severe COPD on home O2: continuous 2 liters [place]. sats ok.  4) OSA -on CPAP 5) Morbid obesity  6) Hypokalemia - BMET pending today. 7) Left parotid tumor - newly diagnosed on CT 11/26, s/p FNA with IR on 11/18. plan as below 8) Deconditioning - CR seeing  Back on po diuretics today. BMET pending. CVP 7.  Likely home today.   Parotid tumor discussed with Dr. Benjamine Mola (ENT) on 11/27. It is very large deep tumor. Usually benign but can degenerate. Recommends resection but she is not operative candidate at this point. FNA performed yesterday. Awaiting results. She will  f/u in [place] with potential referral to [place] at [facility].   Shirley Friar, PA-C 7:52 AM Advanced Heart Failure Team Pager  [phone] (M-F; 7a - 4p)  Please contact [facility] for night-coverage after hours (4p -7a ) and weekends on [email or web address]   Patient seen and examined with Oda Kilts, PA-C. We discussed all aspects of the encounter. I agree with the assessment and plan as stated above.   [place] numbers reviewed with her. Much improved. Will use torsemide 40 bid. Take metolazone 30m and kcl 20 for weight 235 or greater. Underwent FNA of parotid tumor. Will await pathology and f/u with Dr. TBenjamine Mola She would have a very high operative risk with PAH and COPD.   Bensimhon, Daniel,MD 8:23 AM

## 2015-08-05 NOTE — Progress Notes (Signed)
Dc instructions given to pt at this time.  Pt verbalized understanding of all instructions.  Telemetry dc'd.  Picc line dc'd.  No s/s of any acute distress.  No c/o pain.

## 2015-08-05 NOTE — Progress Notes (Signed)
Came to ambulate pt before d/c however she adamantly refused. Could not encourage her to walk. Sts she will be walking at home.  Yves Dill CES, ACSM 10:18 AM 08/05/2015

## 2015-08-07 ENCOUNTER — Ambulatory Visit: Payer: Medicare Other | Admitting: Pulmonary Disease

## 2015-08-07 ENCOUNTER — Other Ambulatory Visit (HOSPITAL_COMMUNITY): Payer: Self-pay | Admitting: Adult Health

## 2015-08-07 ENCOUNTER — Other Ambulatory Visit: Payer: Self-pay | Admitting: Cardiology

## 2015-08-14 ENCOUNTER — Encounter (HOSPITAL_COMMUNITY): Payer: Self-pay | Admitting: Internal Medicine

## 2015-08-14 ENCOUNTER — Ambulatory Visit (HOSPITAL_COMMUNITY)
Admit: 2015-08-14 | Discharge: 2015-08-14 | Disposition: A | Discharge status: Home / Self Care | Payer: Medicare Other | Source: Ambulatory Visit | Attending: Internal Medicine | Admitting: Internal Medicine

## 2015-08-14 ENCOUNTER — Ambulatory Visit (INDEPENDENT_AMBULATORY_CARE_PROVIDER_SITE_OTHER): Payer: Medicare Other | Admitting: Otolaryngology

## 2015-08-14 VITALS — BP 116/60 | HR 91 | Wt 231.0 lb

## 2015-08-14 DIAGNOSIS — I5032 Chronic diastolic (congestive) heart failure: Secondary | ICD-10-CM

## 2015-08-14 DIAGNOSIS — Z880 Allergy status to penicillin: Secondary | ICD-10-CM | POA: Diagnosis not present

## 2015-08-14 DIAGNOSIS — Z9981 Dependence on supplemental oxygen: Secondary | ICD-10-CM | POA: Insufficient documentation

## 2015-08-14 DIAGNOSIS — Z882 Allergy status to sulfonamides status: Secondary | ICD-10-CM | POA: Insufficient documentation

## 2015-08-14 DIAGNOSIS — Z79899 Other long term (current) drug therapy: Secondary | ICD-10-CM | POA: Insufficient documentation

## 2015-08-14 DIAGNOSIS — D119 Benign neoplasm of major salivary gland, unspecified: Secondary | ICD-10-CM | POA: Diagnosis not present

## 2015-08-14 DIAGNOSIS — G4733 Obstructive sleep apnea (adult) (pediatric): Secondary | ICD-10-CM | POA: Insufficient documentation

## 2015-08-14 DIAGNOSIS — Z7982 Long term (current) use of aspirin: Secondary | ICD-10-CM | POA: Insufficient documentation

## 2015-08-14 DIAGNOSIS — I2721 Secondary pulmonary arterial hypertension: Secondary | ICD-10-CM

## 2015-08-14 DIAGNOSIS — Z885 Allergy status to narcotic agent status: Secondary | ICD-10-CM | POA: Insufficient documentation

## 2015-08-14 DIAGNOSIS — J9611 Chronic respiratory failure with hypoxia: Secondary | ICD-10-CM

## 2015-08-14 DIAGNOSIS — E785 Hyperlipidemia, unspecified: Secondary | ICD-10-CM | POA: Insufficient documentation

## 2015-08-14 DIAGNOSIS — J449 Chronic obstructive pulmonary disease, unspecified: Secondary | ICD-10-CM | POA: Insufficient documentation

## 2015-08-14 DIAGNOSIS — I272 Other secondary pulmonary hypertension: Secondary | ICD-10-CM | POA: Diagnosis not present

## 2015-08-14 NOTE — Patient Instructions (Signed)
We will contact you in 2 months to schedule your next appointment.  

## 2015-08-14 NOTE — Progress Notes (Signed)
ADVANCED HF CLINIC NOTE  Patient ID: Alexis [name], female   DOB: 1956-01-11, 59 y.o.   MRN: [id number] PCP: Dr Frann Rider [place] [phone]  HPI Alexis [name] is a 59 year old female with history of obesity, severe COPD, mixed PAH and diastolic dysfunction.   Initial presentation was to [facility] in 2012 with acute on chronic respiratory failure. Based on the CT scan it was felt that the patient had severe pulmonary hypertension and was referred for diagnostic cardiac catheterization. Coronaries were normal. Her PA pressures and mean pulmonary artery pressure were markedly elevated. She was found to have severe obstructive/restrictive lung disease but with very little response to bronchodilator therapy. VQ and CT negative for PE. She was then subsequently referred to Dr. Koleen Nimrod. Her PH was felt to be out of proportion to her COPD and she was started on PDE-5 inhibitor and macitentan was added, She has been intolerant of Ventavis due to cough.   Admitted from clinic 07/29/15 with marked volume overload and SOB with minimal activity and O2 desaturations into the 70-80s in clinic despite home O2.  Echo 07/30/15 with severe Pulmonary hypertension (PA peak pressure 115 mm Hg) and RV severely dilated.Improved with aggressive diuresis and [facility] 07/2815 showed adequate diuresis, normal coronaries, and significant improved of PA pressures from previous cath (full report below).  Left parotid mass noted on CT with c/o of left neck fullness. 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 Dr Benjamine Mola consulted for outpatient follow up. Diuresed well on IV lasix. Discharge weight 230 lbs.  She returns for post hospital follow up. Weight stable from discharge. Breathing much better at home.  Can occasionally even go without 02 at home sometimes, 02 sat running 94-96% even without supplement. Felt good walking into office today.  Has not been able to say that since 2012. Did take an extra  metolazone 08/12/15 with sodium indiscretion (biscuits and gravy). Weight at home 230-235 (took metolazone at 235) Does still have mild bendopnea/orthopnea. Watching her fluid intake and sodium intake. Not using CPAP. Taking all medications. Has not been smoking.   Initial RHC April 02, 2011.  RA mean of 25 RV pressure is 117/13 PA 112/54 with mean of 74.  CO 5.8 liters. CI 3.3 liters.   PVR approximately 13 Woods units. Started on sildenafil (switched to tadalafil)  July 2012 pulmonary function test FVC 1.17 L per minute 37% of predicted, FEV1 1 L 40% of predicted. DLCO 61%  ECHO 06/2011 EF 60-65%. RV moderately dilated and mild to moderately hypokinetic with septal flattening RVSP 77 ECHO 10/02/12 Poor quality. EF 60-65% RV does appear better.  ECHO 1/15 EF 65% RV mildly dilated/HK D-shaped septum RVSP 72  07/12/11 6MW at pulmonary rehab unchanged from August 2012. 900 feet. 04/26/12  6MW 870 feet O2 sats 93-94% on 2 liters [place]. 10/02/12 6MW 830 ft on 2L O2 with sats 87-90% 04/16/13            6MW 760 ft on 2L O2 sats 89-93% 01/21/14            6MW 600 ft on 2L O2 sats 88-91%   10/06/12 [place] on adcirca and macitentan  RA = 15  RV = 91/15/20  PA = 90/41 (64)  PCW = 20  Fick cardiac output/index = 5.2/2.5  PVR = 8.4 Woods  FA sat = 95%  PA sat = 69%, 73%  High SVC sat = 71%  08/04/15 RHC on adcirca and macitentan RA = 14 RV =  70/11/16 PA = 73/31 (52) PCW = 15 Ao = 91/55 (68) LV = 99/6/20 Fick cardiac output/index = 5.5/2.7 PVR = 6.8 WU SVR = 791 dynes FA sat = 96% PA sat = 66%, 67%  08/04/15 LHC  Normal coronaries with separate ostia for LAD and LCX    Allergies  Allergen Reactions  . Bactrim     Face and eye swelling  . Morphine And Related Other (See Comments)    Severe headache  . Penicillins     "doesn't work" got a lot as a child that doesn't work. But no systemic reaction.  . Sulfa Antibiotics Itching   Current Outpatient Prescriptions on File Prior to  Encounter  Medication Sig Dispense Refill  . acetaminophen (TYLENOL) 500 MG tablet Take 1,000 mg by mouth every 6 (six) hours as needed. For pain    . ADCIRCA 20 MG TABS Take 2 tablets by mouth  daily 180 tablet 3  . albuterol (PROVENTIL HFA;VENTOLIN HFA) 108 (90 BASE) MCG/ACT inhaler Inhale 2 puffs into the lungs every 4 (four) hours as needed for wheezing or shortness of breath.    Marland Kitchen aspirin 81 MG EC tablet Take 81 mg by mouth daily.      . Macitentan (OPSUMIT) 10 MG TABS Take 10 mg by mouth daily. 90 tablet 3  . metolazone (ZAROXOLYN) 5 MG tablet Take 1 tablet (5 mg total) by mouth as needed. For weights 235 or greater with an extra 20 meq of potassium. 20 tablet 6  . omeprazole (PRILOSEC) 20 MG capsule Take 20 mg by mouth daily.     . potassium chloride SA (K-DUR,KLOR-CON) 20 MEQ tablet TAKE 2 TABLETS BY MOUTH TWICE DAILY. TAKE 4 TABLETS BY MOUTH TWICE DAILY ON TUESDAYS AND FRIDAYS 150 tablet 3  . spironolactone (ALDACTONE) 25 MG tablet Take 1 tablet (25 mg total) by mouth daily. 90 tablet 3  . torsemide (DEMADEX) 20 MG tablet TAKE 2 TABLETS BY MOUTH TWICE DAILY 220 tablet 0  . traMADol (ULTRAM) 50 MG tablet Take 50 mg by mouth every 12 (twelve) hours as needed for moderate pain.    . Aspirin-Acetaminophen-Caffeine (GOODY HEADACHE PO) Take 1 Package by mouth.     No current facility-administered medications on file prior to encounter.    Past Medical History  Diagnosis Date  . SOB (shortness of breath) on exertion   . Other chronic pulmonary heart diseases   . Chronic obstructive asthma with exacerbation (HCC)     CT scan was a pattern but no evidence of pulmonary embolus and., Negative VQ scan July 2012 pulmonary function test FVC 1.17 L per minute 37% of predicted, FEV1 1 L 40% of predicted. DLCO 61% of predicted no response to bronchodilators negative HIV serology, ANA in scleroderma.  . Tobacco use disorder   . Obesity, unspecified   . Other and unspecified hyperlipidemia   .  Cardiomegaly     Normal LV systolic function ejection fraction 55-60% with moderate decrease in RV function.  . Other diseases of lung, not elsewhere classified   . Hepatomegaly   . Body mass index 40.0-44.9, adult ([place])   . Edema   . Pulmonary arterial hypertension (HCC)      PA pressure 112/54 mmHg the mean of 74 mmHg, right atrial pressure mean 25 mmHg. Pulmonary vascular resistance 13 with units. Cardiac output 5.8 L. Normal coronary arteries with normal LV function July 2012   . Obstructive sleep apnea     BiPAP initiated July 2012  PHYSICAL EXAM Filed Vitals:   08/14/15 1519  BP: 116/60  Pulse: 91  Weight: 231 lb (104.781 kg)  SpO2: 96%     General: Chronically ill appearing. SOB at rest.  Husband present HEENT: normal  Neck: supple. Thick. JVP hard to see. Appears up  Carotids 2+ bilat; no bruits. No lymphadenopathy or thryomegaly appreciated.  Cor: PMI nonpalpable. Distant. Regular rate and rhythm. increased P2. Soft TR no RV lift Lungs: diminished throughout 2 liters [place] Abdomen: obese, NT. + distended. Good bowel sounds.  Extremities: no cyanosis, clubbing, rash, traceankle edema.   Neuro: alert & oriented x 3, cranial nerves grossly intact. moves all 4 extremities w/o difficulty. Affect pleasant.  ASSESSMENT AND PLAN  1) Chronic diastolic HF: - Volume status much improved after recent admission - Continue current diuretic regimen - Reinforced fluid restriction 2) PAH: Mixed.  She has severe COPD and a component of group 3 PAH but also concerned for group 1 PAH.  Stable class III symptoms.   - Continue macitentan and Adcirca.  She has been unable to get Tyvaso.  - Continue O2 and CPAP. - Continue current regimen. - Could consider addition of selexipag 3) COPD on home O2: Continuous 2 liters [place].  4) OSA-  Encouraged to use CPAP nightly 5) Morbid obesity - Encouraged weight loss.  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 - Saw Dr Benjamine Mola today.  Will follow for 6 months and remove if enlarging or has symptoms in the meant time.   Alexis Friar, PA-C 08/14/2015  Patient seen and examined with Alexis Kilts, PA-C. We discussed all aspects of the encounter. I agree with the assessment and plan as stated above.   Volume status and functional capacity much improved after recent diuresis. Reinforced need for daily weights and reviewed use of sliding scale diuretics. Will not add selexapeg at this point for [facility]. Continue to follow with Dr. Benjamine Mola for parotid tumor.   Bensimhon, Daniel,MD 4:29 PM

## 2015-08-15 ENCOUNTER — Telehealth (HOSPITAL_COMMUNITY): Payer: Self-pay | Admitting: Cardiology

## 2015-08-15 NOTE — Telephone Encounter (Signed)
Patient called regarding referral for husband to see cardiology Unclear whick cardiologist patients husband should be referred to  Attempt to contact patient x 2 line busy

## 2015-08-21 ENCOUNTER — Encounter (HOSPITAL_COMMUNITY): Payer: Medicare Other | Admitting: Internal Medicine

## 2015-08-29 ENCOUNTER — Other Ambulatory Visit (HOSPITAL_COMMUNITY): Payer: Self-pay | Admitting: Internal Medicine

## 2015-09-18 DIAGNOSIS — I10 Essential (primary) hypertension: Secondary | ICD-10-CM | POA: Diagnosis not present

## 2015-09-18 DIAGNOSIS — Z7982 Long term (current) use of aspirin: Secondary | ICD-10-CM | POA: Diagnosis not present

## 2015-09-18 DIAGNOSIS — K625 Hemorrhage of anus and rectum: Secondary | ICD-10-CM | POA: Diagnosis not present

## 2015-09-18 DIAGNOSIS — Z87891 Personal history of nicotine dependence: Secondary | ICD-10-CM | POA: Diagnosis not present

## 2015-09-18 DIAGNOSIS — K5900 Constipation, unspecified: Secondary | ICD-10-CM | POA: Diagnosis not present

## 2015-09-18 DIAGNOSIS — Z79899 Other long term (current) drug therapy: Secondary | ICD-10-CM | POA: Diagnosis not present

## 2015-09-18 DIAGNOSIS — Z7901 Long term (current) use of anticoagulants: Secondary | ICD-10-CM | POA: Diagnosis not present

## 2015-09-18 DIAGNOSIS — I509 Heart failure, unspecified: Secondary | ICD-10-CM | POA: Diagnosis not present

## 2015-09-29 ENCOUNTER — Telehealth (HOSPITAL_COMMUNITY): Payer: Self-pay | Admitting: Cardiology

## 2015-09-29 NOTE — Telephone Encounter (Signed)
Patient called to request an appeal for P.HTN med (opsumit) Patient reports she changed Medicare part D for 2017 Her specialty medication required PA and patient was able to initiate  herself however it returned denied and now requires appeal    Please call Jackquline Denmark [phone] Patient ID # [id number]  Note* patient is also on adcirca this may require  In the future

## 2015-09-30 NOTE — Telephone Encounter (Signed)
Opsumit was denied 1/22, will work on appeal

## 2015-10-09 ENCOUNTER — Encounter (HOSPITAL_COMMUNITY): Payer: Self-pay | Admitting: *Deleted

## 2015-10-13 NOTE — Telephone Encounter (Signed)
Opsumit approved from 09/28/15 "until further notice" ref # [id number], pt aware

## 2015-10-14 ENCOUNTER — Encounter (HOSPITAL_COMMUNITY): Payer: Medicare Other | Admitting: Internal Medicine

## 2015-11-10 ENCOUNTER — Encounter (HOSPITAL_COMMUNITY): Payer: Self-pay | Admitting: Internal Medicine

## 2015-11-10 ENCOUNTER — Ambulatory Visit (HOSPITAL_COMMUNITY)
Admission: RE | Admit: 2015-11-10 | Discharge: 2015-11-10 | Disposition: A | Discharge status: Home / Self Care | Payer: Medicare Other | Source: Ambulatory Visit | Attending: Internal Medicine | Admitting: Internal Medicine

## 2015-11-10 VITALS — BP 126/76 | HR 91 | Wt 226.8 lb

## 2015-11-10 DIAGNOSIS — Z79899 Other long term (current) drug therapy: Secondary | ICD-10-CM | POA: Insufficient documentation

## 2015-11-10 DIAGNOSIS — J449 Chronic obstructive pulmonary disease, unspecified: Secondary | ICD-10-CM | POA: Diagnosis not present

## 2015-11-10 DIAGNOSIS — I5032 Chronic diastolic (congestive) heart failure: Secondary | ICD-10-CM | POA: Insufficient documentation

## 2015-11-10 DIAGNOSIS — Z880 Allergy status to penicillin: Secondary | ICD-10-CM | POA: Insufficient documentation

## 2015-11-10 DIAGNOSIS — Z885 Allergy status to narcotic agent status: Secondary | ICD-10-CM | POA: Diagnosis not present

## 2015-11-10 DIAGNOSIS — Z9981 Dependence on supplemental oxygen: Secondary | ICD-10-CM | POA: Insufficient documentation

## 2015-11-10 DIAGNOSIS — I272 Other secondary pulmonary hypertension: Secondary | ICD-10-CM | POA: Insufficient documentation

## 2015-11-10 DIAGNOSIS — E785 Hyperlipidemia, unspecified: Secondary | ICD-10-CM | POA: Diagnosis not present

## 2015-11-10 DIAGNOSIS — Z882 Allergy status to sulfonamides status: Secondary | ICD-10-CM | POA: Diagnosis not present

## 2015-11-10 DIAGNOSIS — Z7982 Long term (current) use of aspirin: Secondary | ICD-10-CM | POA: Insufficient documentation

## 2015-11-10 DIAGNOSIS — Z87898 Personal history of other specified conditions: Secondary | ICD-10-CM | POA: Insufficient documentation

## 2015-11-10 DIAGNOSIS — G4733 Obstructive sleep apnea (adult) (pediatric): Secondary | ICD-10-CM | POA: Diagnosis not present

## 2015-11-10 DIAGNOSIS — I2721 Secondary pulmonary arterial hypertension: Secondary | ICD-10-CM

## 2015-11-10 LAB — BASIC METABOLIC PANEL
Anion gap: 14 (ref 5–15)
BUN: 20 mg/dL (ref 6–20)
CHLORIDE: 91 mmol/L — AB (ref 101–111)
CO2: 32 mmol/L (ref 22–32)
Calcium: 9.4 mg/dL (ref 8.9–10.3)
Creatinine, Ser: 1 mg/dL (ref 0.44–1.00)
GFR calc Af Amer: >60 mL/min (ref >60)
GFR calc non Af Amer: 60 mL/min — ABNORMAL LOW (ref >60)
Glucose, Bld: 160 mg/dL — ABNORMAL HIGH (ref 65–99)
POTASSIUM: 3.1 mmol/L — AB (ref 3.5–5.1)
SODIUM: 137 mmol/L (ref 135–145)

## 2015-11-10 LAB — BRAIN NATRIURETIC PEPTIDE: B NATRIURETIC PEPTIDE 5: 118.5 pg/mL — AB (ref 0.0–100.0)

## 2015-11-10 NOTE — Patient Instructions (Signed)
Labs today  Your physician recommends that you schedule a follow-up appointment in: 3 months with echocardiogram

## 2015-11-10 NOTE — Progress Notes (Signed)
Advanced Heart Failure Medication Review by a Pharmacist  Does the patient  feel that his/her medications are working for him/her?  yes  Has the patient been experiencing any side effects to the medications prescribed?  no  Does the patient measure his/her own blood pressure or blood glucose at home?  no   Does the patient have any problems obtaining medications due to transportation or finances?   No, pt using [facility]  Understanding of regimen: excellent Understanding of indications: excellent Potential of compliance: excellent Patient understands to avoid NSAIDs. Patient understands to avoid decongestants.  Issues to address at subsequent visits: allergies - had concerns that allergy medications would interact with her current medications and has not been using any antihistamines to control seasonal allergy sx   Pharmacist comments: Ms. Lanphear is a pleasant 60 yo woman with excellent health literacy and understanding of her medication regimen. She presents to clinic today for f/u HFpEF (EF 65%) and PAH. Fluid status also remains stable, and Ms. Dershem reports no recent use of metolazone 5 mg, which she has been instructed to use if her wt > 235 lbs. Ms. Crookshanks self-titrates her torsemide dose, taking 40 mg qAM and prn PM doses of 40 mg if she feels that her fluid status is going up. She takes scheduled potassium 40 mg BID with her diuretic.   Ms. Batley has also started taking prn ibuprofen for shoulder pain and is aware of its risks in heart failure, only taking ibuprofen sparingly and if unable to be controlled. She has had 2 doses of 100 mg in the past week and had not been on ibuprofen in the past month aside from this week. Aside from her shoulder pain, Ms. Brigham also has headaches which are associated with sinus congestion that started about a month ago, likely d/t seasonal allergies. As mentioned above, she had not been taking any antihistamines because of the concern for  potential drug-drug interactions with her current medications. She was reminded that she can take antihistamines without decongestants (Zyrtec OK, but not Zyrtec-D), which would take several days on therapy to see relief of allergy symptoms.     Heloise Ochoa, [place].D., BCPS PGY2 Cardiology Pharmacy Resident Pager: [phone]   Time with patient: 10 min Preparation and documentation time: 20 min Total time: 30 min

## 2015-11-10 NOTE — Progress Notes (Signed)
Patient ID: Alexis [name], female   DOB: 27-Jul-1956, 60 y.o.   MRN: [id number]  ADVANCED HF CLINIC NOTE  Patient ID: Alexis [name], female   DOB: [birth year], 60 y.o.   MRN: [id number] PCP: Dr Frann Rider [place] [phone]  HPI Alexis [name] is a 60 year old female with history of obesity, severe COPD, mixed PAH and diastolic dysfunction.   Initial presentation was to [facility] in 2012 with acute on chronic respiratory failure. Based on the CT scan it was felt that the patient had severe pulmonary hypertension and was referred for diagnostic cardiac catheterization. Coronaries were normal. Her PA pressures and mean pulmonary artery pressure were markedly elevated. She was found to have severe obstructive/restrictive lung disease but with very little response to bronchodilator therapy. VQ and CT negative for PE. She was then subsequently referred to Dr. Koleen Nimrod. Her PH was felt to be out of proportion to her COPD and she was started on PDE-5 inhibitor and macitentan was added, She has been intolerant of Ventavis due to cough.   Admitted from clinic 07/29/15 with marked volume overload and SOB with minimal activity and O2 desaturations into the 70-80s in clinic despite home O2.  Echo 07/30/15 with severe Pulmonary hypertension (PA peak pressure 115 mm Hg) and RV severely dilated.Improved with aggressive diuresis and [facility] 07/2815 showed adequate diuresis, normal coronaries, and significant improved of PA pressures from previous cath (full report below).  Left parotid mass noted on CT with c/o of left neck fullness. 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 Dr Benjamine Mola consulted for outpatient follow up. Diuresed well on IV lasix. Discharge weight 230 lbs.  Here for routine HF f/u. Has kept fluid off. Weight at home 223 today. Takes torsemide 40 bid. Takes metolazone when weight hits 230 only has to do this about 2x/month. Breathing better. Not wearing O2 that much.  Follows her sats and places O2 when sats drop below 90%. Not using CPAP. Taking all medications. Has not been smoking.   Initial RHC April 02, 2011.  RA mean of 25 RV pressure is 117/13 PA 112/54 with mean of 74.  CO 5.8 liters. CI 3.3 liters.   PVR approximately 13 Woods units. Started on sildenafil (switched to tadalafil)  July 2012 pulmonary function test FVC 1.17 L per minute 37% of predicted, FEV1 1 L 40% of predicted. DLCO 61%  ECHO 06/2011 EF 60-65%. RV moderately dilated and mild to moderately hypokinetic with septal flattening RVSP 77 ECHO 10/02/12 Poor quality. EF 60-65% RV does appear better.  ECHO 1/15 EF 65% RV mildly dilated/HK D-shaped septum RVSP 72 ECHO 11/16 EF 60% RVSP 115 (but 70 on follow-up cath)   07/12/11 6MW at pulmonary rehab unchanged from August 2012. 900 feet. 04/26/12  6MW 870 feet O2 sats 93-94% on 2 liters [place]. 10/02/12 6MW 830 ft on 2L O2 with sats 87-90% 04/16/13            6MW 760 ft on 2L O2 sats 89-93% 01/21/14            6MW 600 ft on 2L O2 sats 88-91%   10/06/12 [place] on adcirca and macitentan  RA = 15  RV = 91/15/20  PA = 90/41 (64)  PCW = 20  Fick cardiac output/index = 5.2/2.5  PVR = 8.4 Woods  FA sat = 95%  PA sat = 69%, 73%  High SVC sat = 71%  08/04/15 RHC on adcirca and macitentan RA = 14 RV = 70/11/16 PA =  73/31 (52) PCW = 15 Ao = 91/55 (68) LV = 99/6/20 Fick cardiac output/index = 5.5/2.7 PVR = 6.8 WU SVR = 791 dynes FA sat = 96% PA sat = 66%, 67%  08/04/15 LHC  Normal coronaries with separate ostia for LAD and LCX    Allergies  Allergen Reactions  . Bactrim     Face and eye swelling  . Morphine And Related Other (See Comments)    Severe headache  . Penicillins     "doesn't work" got a lot as a child that doesn't work. But no systemic reaction.  . Sulfa Antibiotics Itching   Current Outpatient Prescriptions on File Prior to Encounter  Medication Sig Dispense Refill  . acetaminophen (TYLENOL) 500 MG tablet Take 1,000  mg by mouth every 6 (six) hours as needed. For pain    . ADCIRCA 20 MG TABS Take 2 tablets by mouth  daily 180 tablet 3  . albuterol (PROVENTIL HFA;VENTOLIN HFA) 108 (90 BASE) MCG/ACT inhaler Inhale 2 puffs into the lungs every 4 (four) hours as needed for wheezing or shortness of breath.    Marland Kitchen aspirin 81 MG EC tablet Take 81 mg by mouth daily.      . Aspirin-Acetaminophen-Caffeine (GOODY HEADACHE PO) Take 1 Package by mouth daily as needed (Severe Headaches).     . Macitentan (OPSUMIT) 10 MG TABS Take 10 mg by mouth daily. 90 tablet 3  . metolazone (ZAROXOLYN) 5 MG tablet Take 1 tablet (5 mg total) by mouth as needed. For weights 235 or greater with an extra 20 meq of potassium. 20 tablet 6  . omeprazole (PRILOSEC) 20 MG capsule Take 20 mg by mouth daily.     Marland Kitchen spironolactone (ALDACTONE) 25 MG tablet Take 1 tablet (25 mg total) by mouth daily. 90 tablet 3  . torsemide (DEMADEX) 20 MG tablet TAKE 2 TABLETS BY MOUTH TWICE DAILY 220 tablet 0  . traMADol (ULTRAM) 50 MG tablet Take 50 mg by mouth every 12 (twelve) hours as needed for moderate pain. Reported on 11/10/2015     No current facility-administered medications on file prior to encounter.    Past Medical History  Diagnosis Date  . SOB (shortness of breath) on exertion   . Other chronic pulmonary heart diseases   . Chronic obstructive asthma with exacerbation (HCC)     CT scan was a pattern but no evidence of pulmonary embolus and., Negative VQ scan July 2012 pulmonary function test FVC 1.17 L per minute 37% of predicted, FEV1 1 L 40% of predicted. DLCO 61% of predicted no response to bronchodilators negative HIV serology, ANA in scleroderma.  . Tobacco use disorder   . Obesity, unspecified   . Other and unspecified hyperlipidemia   . Cardiomegaly     Normal LV systolic function ejection fraction 55-60% with moderate decrease in RV function.  . Other diseases of lung, not elsewhere classified   . Hepatomegaly   . Body mass index  40.0-44.9, adult ([place])   . Edema   . Pulmonary arterial hypertension (HCC)      PA pressure 112/54 mmHg the mean of 74 mmHg, right atrial pressure mean 25 mmHg. Pulmonary vascular resistance 13 with units. Cardiac output 5.8 L. Normal coronary arteries with normal LV function July 2012   . Obstructive sleep apnea     BiPAP initiated July 2012   PHYSICAL EXAM Filed Vitals:   11/10/15 1402  BP: 126/76  Pulse: 91  Weight: 226 lb 12 oz (102.853 kg)  SpO2:  96%     General: Sitting in clinic. No dyspnea.  Husband present HEENT: normal  Neck: supple. Thick. JVP hard to see. Appears flat  Carotids 2+ bilat; no bruits. No lymphadenopathy or thryomegaly appreciated.  Cor: PMI nonpalpable. Distant. Regular rate and rhythm. increased P2. Soft TR no RV lift Lungs: diminished throughout 2 liters [place] Abdomen: obese, NT. nondistended. Good bowel sounds.  Extremities: no cyanosis, clubbing, rash, no  edema.   Neuro: alert & oriented x 3, cranial nerves grossly intact. moves all 4 extremities w/o difficulty. Affect pleasant.  ASSESSMENT AND PLAN  1) Chronic diastolic HF: - Volume status much improved. Looks dry today - Continue current diuretic regimen - Check labs today 2) PAH: Mixed.  She has severe COPD and a component of group 3 PAH but also concerned for group 1 PAH.  Stable class III symptoms.   - Continue macitentan and Adcirca.  - Continue O2 and CPAP. - Continue current regimen. - Could consider addition of selexipag - Will see back in 3 months with echo 3) COPD on home O2: Continuous 2 liters [place].  4) OSA-  Encouraged to use CPAP nightly 5) Morbid obesity - Encouraged weight loss.  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 - Saw Dr Benjamine Mola. Will follow in May or Juneand remove if enlarging or has symptoms in the meant time.    Kasem Mozer,MD 2:31 PM

## 2015-11-26 ENCOUNTER — Telehealth (HOSPITAL_COMMUNITY): Payer: Self-pay | Admitting: Vascular Surgery

## 2015-11-26 MED ORDER — POTASSIUM CHLORIDE CRYS ER 20 MEQ PO TBCR: 40.0000 meq | EXTENDED_RELEASE_TABLET | Freq: Three times a day (TID) | ORAL | Status: DC

## 2015-11-26 NOTE — Telephone Encounter (Signed)
Pt aware of lab results, she states she has been taking KCL 40 meq bid and will increase per Dr Haroldine Laws recommendations:  Notes Recorded by Jolaine Artist, MD on 11/17/2015 at 9:57 PM Is she taking potassium? If so have her take 80 extra and increase to 40 tid. Repeat 1 week.

## 2015-11-26 NOTE — Telephone Encounter (Signed)
Pt returning a call she received from [place] last week about lab results .Marland Kitchen Please advise

## 2015-11-26 NOTE — Telephone Encounter (Signed)
Order for repeat labs faxed to [facility] at [phone] for labs next week

## 2015-12-02 ENCOUNTER — Encounter (HOSPITAL_COMMUNITY): Payer: Self-pay | Admitting: Pharmacist

## 2015-12-12 ENCOUNTER — Telehealth (HOSPITAL_COMMUNITY): Payer: Self-pay | Admitting: *Deleted

## 2015-12-12 NOTE — Telephone Encounter (Signed)
Completed Appeal for Alexis [name], med finally approved "11/22/15 until further notice" for 60 tabs for 30 day supply, pt aware and states she received the med yesterday

## 2016-01-09 ENCOUNTER — Other Ambulatory Visit (HOSPITAL_COMMUNITY): Payer: Self-pay | Admitting: *Deleted

## 2016-01-09 MED ORDER — POTASSIUM CHLORIDE CRYS ER 20 MEQ PO TBCR: 60.0000 meq | EXTENDED_RELEASE_TABLET | Freq: Two times a day (BID) | ORAL | Status: DC

## 2016-01-09 MED ORDER — TORSEMIDE 20 MG PO TABS: 40.0000 mg | ORAL_TABLET | Freq: Two times a day (BID) | ORAL | Status: DC

## 2016-02-06 ENCOUNTER — Other Ambulatory Visit (INDEPENDENT_AMBULATORY_CARE_PROVIDER_SITE_OTHER): Payer: Self-pay | Admitting: Otolaryngology

## 2016-02-06 DIAGNOSIS — K118 Other diseases of salivary glands: Secondary | ICD-10-CM

## 2016-02-11 ENCOUNTER — Encounter (HOSPITAL_COMMUNITY): Payer: Medicare Other | Admitting: Internal Medicine

## 2016-02-11 ENCOUNTER — Ambulatory Visit (HOSPITAL_COMMUNITY): Admission: RE | Admit: 2016-02-11 | Payer: Medicare Other | Source: Ambulatory Visit

## 2016-02-12 ENCOUNTER — Ambulatory Visit (HOSPITAL_COMMUNITY)
Admission: RE | Admit: 2016-02-12 | Discharge: 2016-02-12 | Disposition: A | Discharge status: Home / Self Care | Payer: Medicare Other | Source: Ambulatory Visit | Attending: Otolaryngology | Admitting: Otolaryngology

## 2016-02-12 DIAGNOSIS — R220 Localized swelling, mass and lump, head: Secondary | ICD-10-CM | POA: Insufficient documentation

## 2016-02-12 DIAGNOSIS — K118 Other diseases of salivary glands: Secondary | ICD-10-CM

## 2016-02-12 DIAGNOSIS — I6529 Occlusion and stenosis of unspecified carotid artery: Secondary | ICD-10-CM | POA: Diagnosis not present

## 2016-02-12 DIAGNOSIS — D110 Benign neoplasm of parotid gland: Secondary | ICD-10-CM | POA: Diagnosis not present

## 2016-02-12 DIAGNOSIS — R221 Localized swelling, mass and lump, neck: Secondary | ICD-10-CM | POA: Diagnosis not present

## 2016-02-12 LAB — POCT I-STAT CREATININE: Creatinine, Ser: 1 mg/dL (ref 0.44–1.00)

## 2016-02-12 MED ORDER — IOPAMIDOL (ISOVUE-300) INJECTION 61%
75.0000 mL | Freq: Once | INTRAVENOUS | Status: AC | PRN
  Administered 2016-02-12: 75 mL via INTRAVENOUS

## 2016-02-23 ENCOUNTER — Ambulatory Visit (INDEPENDENT_AMBULATORY_CARE_PROVIDER_SITE_OTHER): Payer: Medicare Other | Admitting: Otolaryngology

## 2016-02-23 DIAGNOSIS — D37030 Neoplasm of uncertain behavior of the parotid salivary glands: Secondary | ICD-10-CM | POA: Diagnosis not present

## 2016-03-03 DIAGNOSIS — Z882 Allergy status to sulfonamides status: Secondary | ICD-10-CM | POA: Diagnosis not present

## 2016-03-03 DIAGNOSIS — D110 Benign neoplasm of parotid gland: Secondary | ICD-10-CM | POA: Diagnosis not present

## 2016-03-03 DIAGNOSIS — Z87891 Personal history of nicotine dependence: Secondary | ICD-10-CM | POA: Diagnosis not present

## 2016-03-03 DIAGNOSIS — I2510 Atherosclerotic heart disease of native coronary artery without angina pectoris: Secondary | ICD-10-CM | POA: Diagnosis not present

## 2016-03-03 DIAGNOSIS — Z7982 Long term (current) use of aspirin: Secondary | ICD-10-CM | POA: Diagnosis not present

## 2016-03-03 DIAGNOSIS — Z880 Allergy status to penicillin: Secondary | ICD-10-CM | POA: Diagnosis not present

## 2016-03-03 DIAGNOSIS — Z79899 Other long term (current) drug therapy: Secondary | ICD-10-CM | POA: Diagnosis not present

## 2016-03-03 DIAGNOSIS — E669 Obesity, unspecified: Secondary | ICD-10-CM | POA: Diagnosis not present

## 2016-03-03 DIAGNOSIS — Z888 Allergy status to other drugs, medicaments and biological substances status: Secondary | ICD-10-CM | POA: Diagnosis not present

## 2016-03-16 ENCOUNTER — Telehealth (HOSPITAL_COMMUNITY): Payer: Self-pay | Admitting: Cardiology

## 2016-03-16 NOTE — Telephone Encounter (Signed)
Patient called to inform CHF team her PAP has depleted all funds and will no longer be able to get adcirca and opsumit patient currently using [employer] PAP and has used [employer] in the past Currently patient has 3 months worth of adcirca and 2 months of opsumit  Please advise further as patient will not be able to afford medications after her supply runs out

## 2016-03-17 NOTE — Telephone Encounter (Signed)
Called and left VM for patient to call [place] at [phone] for assistance with Opsumit and the ASSIST program at [phone] for assistance with Adcirca.   Ruta Hinds. Velva Harman, PharmD, BCPS, CPP Clinical Pharmacist Pager: [phone] Phone: [phone] 03/17/2016 9:20 AM

## 2016-03-22 ENCOUNTER — Telehealth (HOSPITAL_COMMUNITY): Payer: Self-pay | Admitting: *Deleted

## 2016-03-22 NOTE — Telephone Encounter (Signed)
Alexis [name] with [employer] left voice message stating she wanted to put patient in the bridge program for Opsumit but needed a call back from our office. Message routed to [facility] and [place] who manage our [place] medications.

## 2016-03-22 NOTE — Telephone Encounter (Signed)
Called [employer] and gave approval to move forward with bridge program so that Ms. Broy will get 3 months worth of Opsumit at no charge and if no patient assistance funding within 2 months, will be eligible for patient assistance through [employer].   Ruta Hinds. Velva Harman, PharmD, BCPS, CPP Clinical Pharmacist Pager: [phone] Phone: [phone] 03/22/2016 1:14 PM

## 2016-03-24 ENCOUNTER — Telehealth (HOSPITAL_COMMUNITY): Payer: Self-pay | Admitting: *Deleted

## 2016-03-24 NOTE — Telephone Encounter (Signed)
Called [employer] ASSIST program for [employer] and verified that they did receive the application. This info relayed to [place] who verbalized understanding.  Ruta Hinds. Velva Harman, PharmD, BCPS, CPP Clinical Pharmacist Pager: [phone] Phone: [phone] 03/24/2016 3:30 PM

## 2016-03-24 NOTE — Telephone Encounter (Signed)
Pt called about Adcirca stating [employer] has not received information they need to cover medication. Pt "demands" a call back [phone]

## 2016-03-29 ENCOUNTER — Encounter (HOSPITAL_COMMUNITY): Payer: Medicare Other | Admitting: Internal Medicine

## 2016-03-29 ENCOUNTER — Ambulatory Visit (HOSPITAL_COMMUNITY): Payer: Medicare Other

## 2016-03-29 ENCOUNTER — Other Ambulatory Visit (HOSPITAL_COMMUNITY): Payer: Self-pay

## 2016-03-30 ENCOUNTER — Telehealth (HOSPITAL_COMMUNITY): Payer: Self-pay

## 2016-03-30 NOTE — Telephone Encounter (Signed)
Pharmacy calling to request new Rx for Opsumit be sent. Also, needing contact # for patient. Above requests fulfilled.  Renee Pain

## 2016-04-01 ENCOUNTER — Telehealth (HOSPITAL_COMMUNITY): Payer: Self-pay | Admitting: *Deleted

## 2016-04-01 NOTE — Telephone Encounter (Signed)
[facility] called to get a refill authorization for her Opsumit.  OK'd her Rx for 10 mg daily, 30 pills, with 6 refills.

## 2016-04-09 ENCOUNTER — Telehealth (HOSPITAL_COMMUNITY): Payer: Self-pay | Admitting: Pharmacist

## 2016-04-09 NOTE — Telephone Encounter (Signed)
[employer] patient assistance approved for Adcirca for up to 3 months.   Ruta Hinds. Velva Harman, PharmD, BCPS, CPP Clinical Pharmacist Pager: [phone] Phone: [phone] 04/09/2016 3:49 PM

## 2016-04-12 ENCOUNTER — Telehealth (HOSPITAL_COMMUNITY): Payer: Self-pay

## 2016-04-12 NOTE — Telephone Encounter (Signed)
Alexis [name] with [employer] called CHF clinic triage line to received VO that Rx for Adcirca was ok to fill with patient assistance program with quantity of 60 tabs and 11 refills for 40 mg once daily. Gave VO per Dr. Haroldine Laws to proceed.  Renee Pain

## 2016-04-22 DIAGNOSIS — H04129 Dry eye syndrome of unspecified lacrimal gland: Secondary | ICD-10-CM | POA: Diagnosis not present

## 2016-04-22 DIAGNOSIS — H538 Other visual disturbances: Secondary | ICD-10-CM | POA: Diagnosis not present

## 2016-04-29 ENCOUNTER — Other Ambulatory Visit (HOSPITAL_COMMUNITY): Payer: Medicare Other

## 2016-05-12 ENCOUNTER — Other Ambulatory Visit (HOSPITAL_COMMUNITY): Payer: Self-pay | Admitting: Adult Health

## 2016-05-18 ENCOUNTER — Ambulatory Visit (HOSPITAL_COMMUNITY): Admission: RE | Admit: 2016-05-18 | Payer: Medicare Other | Source: Ambulatory Visit

## 2016-05-18 ENCOUNTER — Encounter (HOSPITAL_COMMUNITY): Payer: Medicare Other | Admitting: Internal Medicine

## 2016-06-01 ENCOUNTER — Telehealth (HOSPITAL_COMMUNITY): Payer: Self-pay | Admitting: *Deleted

## 2016-06-01 NOTE — Telephone Encounter (Signed)
Pt called concerned about swelling in left foot/ankle, she states it started yesterday and maybe slightly better.  She states it is not red or hot to touch.  Right ankle is fine, no swelling.  She denies injury.  She states it hurts to walk and she can hardly put any weight on it.  Advised needs to checked out by PCP ? Gout, she is agreeable and will contact their office

## 2016-06-21 ENCOUNTER — Ambulatory Visit (HOSPITAL_COMMUNITY): Payer: Medicare Other

## 2016-06-21 ENCOUNTER — Encounter (HOSPITAL_COMMUNITY): Payer: Medicare Other | Admitting: Internal Medicine

## 2016-07-13 ENCOUNTER — Inpatient Hospital Stay (HOSPITAL_COMMUNITY): Admission: RE | Admit: 2016-07-13 | Payer: Medicare Other | Source: Ambulatory Visit | Admitting: Internal Medicine

## 2016-07-13 ENCOUNTER — Ambulatory Visit (HOSPITAL_COMMUNITY): Admission: RE | Admit: 2016-07-13 | Payer: Medicare Other | Source: Ambulatory Visit

## 2016-08-26 ENCOUNTER — Other Ambulatory Visit: Payer: Self-pay | Admitting: Cardiology

## 2016-08-31 ENCOUNTER — Other Ambulatory Visit (HOSPITAL_COMMUNITY): Payer: Self-pay | Admitting: *Deleted

## 2016-08-31 ENCOUNTER — Encounter (HOSPITAL_COMMUNITY): Payer: Self-pay | Admitting: Internal Medicine

## 2016-08-31 ENCOUNTER — Ambulatory Visit (HOSPITAL_BASED_OUTPATIENT_CLINIC_OR_DEPARTMENT_OTHER)
Admission: RE | Admit: 2016-08-31 | Discharge: 2016-08-31 | Disposition: A | Discharge status: Home / Self Care | Payer: Medicare Other | Source: Ambulatory Visit | Attending: Internal Medicine | Admitting: Internal Medicine

## 2016-08-31 ENCOUNTER — Encounter (HOSPITAL_COMMUNITY): Payer: Self-pay | Admitting: *Deleted

## 2016-08-31 ENCOUNTER — Ambulatory Visit (HOSPITAL_COMMUNITY)
Admission: RE | Admit: 2016-08-31 | Discharge: 2016-08-31 | Disposition: A | Discharge status: Home / Self Care | Payer: Medicare Other | Source: Ambulatory Visit | Attending: Internal Medicine | Admitting: Internal Medicine

## 2016-08-31 VITALS — BP 118/67 | HR 97 | Wt 223.0 lb

## 2016-08-31 DIAGNOSIS — J449 Chronic obstructive pulmonary disease, unspecified: Secondary | ICD-10-CM | POA: Insufficient documentation

## 2016-08-31 DIAGNOSIS — Z880 Allergy status to penicillin: Secondary | ICD-10-CM | POA: Diagnosis not present

## 2016-08-31 DIAGNOSIS — I272 Pulmonary hypertension, unspecified: Secondary | ICD-10-CM

## 2016-08-31 DIAGNOSIS — I5032 Chronic diastolic (congestive) heart failure: Secondary | ICD-10-CM | POA: Insufficient documentation

## 2016-08-31 DIAGNOSIS — Z6841 Body Mass Index (BMI) 40.0 and over, adult: Secondary | ICD-10-CM | POA: Diagnosis not present

## 2016-08-31 DIAGNOSIS — Z79899 Other long term (current) drug therapy: Secondary | ICD-10-CM | POA: Insufficient documentation

## 2016-08-31 DIAGNOSIS — Z882 Allergy status to sulfonamides status: Secondary | ICD-10-CM | POA: Diagnosis not present

## 2016-08-31 DIAGNOSIS — Z9981 Dependence on supplemental oxygen: Secondary | ICD-10-CM | POA: Insufficient documentation

## 2016-08-31 DIAGNOSIS — G4733 Obstructive sleep apnea (adult) (pediatric): Secondary | ICD-10-CM | POA: Diagnosis not present

## 2016-08-31 DIAGNOSIS — I2721 Secondary pulmonary arterial hypertension: Secondary | ICD-10-CM | POA: Diagnosis not present

## 2016-08-31 DIAGNOSIS — E785 Hyperlipidemia, unspecified: Secondary | ICD-10-CM | POA: Diagnosis not present

## 2016-08-31 DIAGNOSIS — Z885 Allergy status to narcotic agent status: Secondary | ICD-10-CM | POA: Diagnosis not present

## 2016-08-31 DIAGNOSIS — Z7982 Long term (current) use of aspirin: Secondary | ICD-10-CM | POA: Diagnosis not present

## 2016-08-31 DIAGNOSIS — D119 Benign neoplasm of major salivary gland, unspecified: Secondary | ICD-10-CM | POA: Insufficient documentation

## 2016-08-31 LAB — CBC
HEMATOCRIT: 51.4 % — AB (ref 36.0–46.0)
HEMOGLOBIN: 15.7 g/dL — AB (ref 12.0–15.0)
MCH: 27.4 pg (ref 26.0–34.0)
MCHC: 30.5 g/dL (ref 30.0–36.0)
MCV: 89.9 fL (ref 78.0–100.0)
Platelets: 223 10*3/uL (ref 150–400)
RBC: 5.72 MIL/uL — ABNORMAL HIGH (ref 3.87–5.11)
RDW: 16.4 % — ABNORMAL HIGH (ref 11.5–15.5)
WBC: 8.3 10*3/uL (ref 4.0–10.5)

## 2016-08-31 LAB — BASIC METABOLIC PANEL
ANION GAP: 16 — AB (ref 5–15)
BUN: 21 mg/dL — ABNORMAL HIGH (ref 6–20)
CHLORIDE: 87 mmol/L — AB (ref 101–111)
CO2: 33 mmol/L — AB (ref 22–32)
Calcium: 10 mg/dL (ref 8.9–10.3)
Creatinine, Ser: 1.04 mg/dL — ABNORMAL HIGH (ref 0.44–1.00)
GFR calc Af Amer: >60 mL/min (ref >60)
GFR, EST NON AFRICAN AMERICAN: 56 mL/min — AB (ref >60)
GLUCOSE: 118 mg/dL — AB (ref 65–99)
POTASSIUM: 3.2 mmol/L — AB (ref 3.5–5.1)
Sodium: 136 mmol/L (ref 135–145)

## 2016-08-31 LAB — PROTIME-INR
INR: 1.05
Prothrombin Time: 13.7 seconds (ref 11.4–15.2)

## 2016-08-31 NOTE — Progress Notes (Signed)
Patient ID: Alexis [name], female   DOB: 05/29/1956, 60 y.o.   MRN: [id number]  ADVANCED HF CLINIC NOTE  Patient ID: Alexis [name], female   DOB: 06/27/1956, 60 y.o.   MRN: [id number] PCP: Dr Aaron - [place] [phone]  HPI Alexis [name] is a 60-year-old female with history of obesity, severe COPD, mixed PAH and diastolic dysfunction.   Initial presentation was to [facility] in 2012 with acute on chronic respiratory failure. Based on the CT scan it was felt that the patient had severe pulmonary hypertension and was referred for diagnostic cardiac catheterization. Coronaries were normal. Her PA pressures and mean pulmonary artery pressure were markedly elevated. She was found to have severe obstructive/restrictive lung disease but with very little response to bronchodilator therapy. VQ and CT negative for PE. She was then subsequently referred to Dr. Henderson. Her PH was felt to be out of proportion to her COPD and she was started on PDE-5 inhibitor and macitentan was added, She has been intolerant of Ventavis due to cough.   Admitted from clinic 07/29/15 with marked volume overload and SOB with minimal activity and O2 desaturations into the 70-80s in clinic despite home O2.  Echo 07/30/15 with severe Pulmonary hypertension (PA peak pressure 115 mm Hg) and RV severely dilated.Improved with aggressive diuresis and L/RHC 07/2815 showed adequate diuresis, normal coronaries, and significant improved of PA pressures from previous cath (full report below).  Left parotid mass noted on CT with c/o of left neck fullness. 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 Dr Teoh consulted for outpatient follow up. Diuresed well on IV lasix. Discharge weight 230 lbs.  Here for routine HF f/u. Has been on Adcirca and macitentan. Continues to struggle with severe DOE with almost any exertion. Wearing o2. Has kept fluid off. Weight at home stable. Takes torsemide 40 bid. Takes  metolazone when weight hits 230 only has to do this about 1x/week. Using O2 more regularly. Not using CPAP. Taking all medications. Has not been smoking.   Initial RHC April 02, 2011.  RA mean of 25 RV pressure is 117/13 PA 112/54 with mean of 74.  CO 5.8 liters. CI 3.3 liters.   PVR approximately 13 Woods units. Started on sildenafil (switched to tadalafil)  July 2012 pulmonary function test FVC 1.17 L per minute 37% of predicted, FEV1 1 L 40% of predicted. DLCO 61%  ECHO 06/2011 EF 60-65%. RV moderately dilated and mild to moderately hypokinetic with septal flattening RVSP 77 ECHO 10/02/12 Poor quality. EF 60-65% RV does appear better.  ECHO 1/15 EF 65% RV mildly dilated/HK D-shaped septum RVSP 72 ECHO 11/16 EF 60% RVSP 115 (but 70 on follow-up cath)  Echo 12/17 EF 60% RV markedly dilated but function relatively preserved   07/12/11 6MW at pulmonary rehab unchanged from August 2012. 900 feet. 04/26/12  6MW 870 feet O2 sats 93-94% on 2 liters [place]. 10/02/12 6MW 830 ft on 2L O2 with sats 87-90% 04/16/13            6MW 760 ft on 2L O2 sats 89-93% 01/21/14            6MW 600 ft on 2L O2 sats 88-91%  Unable to do further 6MW  10/06/12 [facility] on adcirca and macitentan  RA = 15  RV = 91/15/20  PA = 90/41 (64)  PCW = 20  Fick cardiac output/index = 5.2/2.5  PVR = 8.4 Woods  FA sat = 95%  PA sat = 69%, 73%    High SVC sat = 71%  08/04/15 RHC on adcirca and macitentan RA = 14 RV = 70/11/16 PA = 73/31 (52) PCW = 15 Ao = 91/55 (68) LV = 99/6/20 Fick cardiac output/index = 5.5/2.7 PVR = 6.8 WU SVR = 791 dynes FA sat = 96% PA sat = 66%, 67%  08/04/15 [facility]  Normal coronaries with separate ostia for LAD and LCX    Allergies  Allergen Reactions  . Bactrim     Face and eye swelling  . Morphine And Related Other (See Comments)    Severe headache  . Penicillins     "doesn't work" got a lot as a child that doesn't work. But no systemic reaction.  . Sulfa Antibiotics Itching   Current  Outpatient Prescriptions on File Prior to Encounter  Medication Sig Dispense Refill  . acetaminophen (TYLENOL) 500 MG tablet Take 1,000 mg by mouth every 6 (six) hours as needed. For pain    . ADCIRCA 20 MG TABS Take 2 tablets by mouth  daily 180 tablet 3  . albuterol (PROVENTIL HFA;VENTOLIN HFA) 108 (90 BASE) MCG/ACT inhaler Inhale 2 puffs into the lungs every 4 (four) hours as needed for wheezing or shortness of breath.    . aspirin 81 MG EC tablet Take 81 mg by mouth daily.      . Aspirin-Acetaminophen-Caffeine (GOODY HEADACHE PO) Take 1 Package by mouth daily as needed (Severe Headaches).     . ibuprofen (ADVIL,MOTRIN) 200 MG tablet Take 200 mg by mouth every 6 (six) hours as needed for moderate pain.    . Macitentan (OPSUMIT) 10 MG TABS Take 10 mg by mouth daily. 90 tablet 3  . metolazone (ZAROXOLYN) 5 MG tablet Take 1 tablet (5 mg total) by mouth as needed. For weights 235 or greater with an extra 20 meq of 15 tablet 3  . spironolactone (ALDACTONE) 25 MG tablet Take 1 tablet (25 mg total) by mouth daily. 90 tablet 3  . traMADol (ULTRAM) 50 MG tablet Take 50 mg by mouth every 12 (twelve) hours as needed for moderate pain. Reported on 11/10/2015     No current facility-administered medications on file prior to encounter.     Past Medical History:  Diagnosis Date  . Body mass index 40.0-44.9, adult (HCC)   . Cardiomegaly    Normal LV systolic function ejection fraction 55-60% with moderate decrease in RV function.  . Chronic obstructive asthma with exacerbation (HCC)    CT scan was a pattern but no evidence of pulmonary embolus and., Negative VQ scan July 2012 pulmonary function test FVC 1.17 L per minute 37% of predicted, FEV1 1 L 40% of predicted. DLCO 61% of predicted no response to bronchodilators negative HIV serology, ANA in scleroderma.  . Edema   . Hepatomegaly   . Obesity, unspecified   . Obstructive sleep apnea    BiPAP initiated July 2012  . Other and unspecified  hyperlipidemia   . Other chronic pulmonary heart diseases   . Other diseases of lung, not elsewhere classified   . Pulmonary arterial hypertension     PA pressure 112/54 mmHg the mean of 74 mmHg, right atrial pressure mean 25 mmHg. Pulmonary vascular resistance 13 with units. Cardiac output 5.8 L. Normal coronary arteries with normal LV function July 2012   . SOB (shortness of breath) on exertion   . Tobacco use disorder    PHYSICAL EXAM Vitals:   08/31/16 1301  BP: 118/67  BP Location: Left Arm  Patient Position: Sitting    Cuff Size: Large  Pulse: 97  SpO2: 90%  Weight: 223 lb (101.2 kg)     General: Sitting in clinic. No dyspnea.  Husband present HEENT: normal  Neck: supple. Thick. JVP hard to see. Appears mildly elevated  Carotids 2+ bilat; no bruits. No lymphadenopathy or thryomegaly appreciated.  Cor: PMI nonpalpable. Distant. Regular rate and rhythm. increased P2. Soft TR no RV lift Lungs: diminished throughout 2 liters [place] Abdomen: obese, NT. nondistended. Good bowel sounds.  Extremities: no cyanosis, clubbing, rash, no  edema.   Neuro: alert & oriented x 3, cranial nerves grossly intact. moves all 4 extremities w/o difficulty. Affect pleasant.  ASSESSMENT AND PLAN  1) Chronic diastolic HF: - Volume status ok - Continue current diuretic regimen - Check labs today 2) PAH: Mixed.  She has severe COPD with a component of group 3 PAH but also Group 1 PAH with significant improvement in PA pressures and symptoms with vasodilators.  Worsening Class IIIB symptoms on macitentan and Adcirca.  - Echo reviewed today with ongoing RH strain  - Will repeat RHC and PFTs with eye toward starting selexipag - Continue O2 and CPAP 3) COPD on home O2: Continuous 2 liters .  4) OSA-  Encouraged to use CPAP nightly 5) Morbid obesity - Encouraged weight loss.  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 - Saw Dr Teoh and [facility]. Tumor felt to be small. Just watchful  waiting.    Alexis Buckner,MD 1:21 PM   

## 2016-08-31 NOTE — Patient Instructions (Signed)
Labs today  Your physician has recommended that you have a pulmonary function test. Pulmonary Function Tests are a group of tests that measure how well air moves in and out of your lungs.  Right Heart Catheterization on 09/15/16, see instruction sheet  We will contact you in 3 months to schedule your next appointment.

## 2016-08-31 NOTE — Progress Notes (Signed)
  Echocardiogram 2D Echocardiogram has been performed.  Diamond Nickel 08/31/2016, 12:54 PM

## 2016-08-31 NOTE — Addendum Note (Signed)
Encounter addended by: Scarlette Calico, RN on: 08/31/2016  1:40 PM<BR>    Actions taken: Order list changed, Diagnosis association updated, Sign clinical note

## 2016-09-01 ENCOUNTER — Telehealth (HOSPITAL_COMMUNITY): Payer: Self-pay | Admitting: *Deleted

## 2016-09-01 ENCOUNTER — Telehealth (HOSPITAL_COMMUNITY): Payer: Self-pay | Admitting: Vascular Surgery

## 2016-09-01 NOTE — Telephone Encounter (Signed)
Patient had a few questions regarding her upcoming catherization procedure.  I verified for her that she does take her aspirin morning of procedure and that this was an outpatient procedure but she can prepare to stay a night in the hospital just incase.  No further questions at this time.

## 2016-09-01 NOTE — Telephone Encounter (Signed)
Left pt message to make PFT APPT

## 2016-09-07 ENCOUNTER — Encounter (HOSPITAL_COMMUNITY): Payer: Self-pay | Admitting: *Deleted

## 2016-09-13 ENCOUNTER — Telehealth (HOSPITAL_COMMUNITY): Payer: Self-pay | Admitting: *Deleted

## 2016-09-13 MED ORDER — POTASSIUM CHLORIDE CRYS ER 20 MEQ PO TBCR: 60.0000 meq | EXTENDED_RELEASE_TABLET | Freq: Every day | ORAL | 3 refills | Status: DC

## 2016-09-13 NOTE — Telephone Encounter (Signed)
Basic metabolic panel  Order: [id number]  Status:  Final result Visible to patient:  Yes (MyChart) Dx:  PAH (pulmonary artery hypertension)  Notes Recorded by Kennieth Rad, RN on 09/13/2016 at 9:06 AM EST Spoke with patient and she is going to take kcl 80 today and will increase her dose to 60 mEq daily. Order for medication increase placed in Epic with new prescription sent to preferred pharmacy. ------  Notes Recorded by Scarlette Calico, RN on 09/09/2016 at 2:07 PM EST Left message to call back ------  Notes Recorded by Harvie Junior, [place] on 09/07/2016 at 1:37 PM EST Left message. Letter mailed. ------  Notes Recorded by Scarlette Calico, RN on 09/01/2016 at 2:37 PM EST Left message to call back ------  Notes Recorded by Scarlette Calico, RN on 09/01/2016 at 12:40 PM EST Left message to call back ------  Notes Recorded by Jolaine Artist, MD on 08/31/2016 at 4:53 PM EST Give kcl 80 and increase daily kcl by 20

## 2016-09-15 ENCOUNTER — Encounter (HOSPITAL_COMMUNITY)
Admission: RE | Disposition: A | Discharge status: Home / Self Care | Payer: Self-pay | Source: Ambulatory Visit | Attending: Internal Medicine

## 2016-09-15 ENCOUNTER — Ambulatory Visit (HOSPITAL_COMMUNITY)
Admission: RE | Admit: 2016-09-15 | Discharge: 2016-09-15 | Disposition: A | Discharge status: Home / Self Care | Payer: Medicare Other | Source: Ambulatory Visit | Attending: Internal Medicine | Admitting: Internal Medicine

## 2016-09-15 DIAGNOSIS — J449 Chronic obstructive pulmonary disease, unspecified: Secondary | ICD-10-CM | POA: Diagnosis not present

## 2016-09-15 DIAGNOSIS — Z6841 Body Mass Index (BMI) 40.0 and over, adult: Secondary | ICD-10-CM | POA: Diagnosis not present

## 2016-09-15 DIAGNOSIS — I5032 Chronic diastolic (congestive) heart failure: Secondary | ICD-10-CM | POA: Insufficient documentation

## 2016-09-15 DIAGNOSIS — Z9981 Dependence on supplemental oxygen: Secondary | ICD-10-CM | POA: Diagnosis not present

## 2016-09-15 DIAGNOSIS — Z882 Allergy status to sulfonamides status: Secondary | ICD-10-CM | POA: Diagnosis not present

## 2016-09-15 DIAGNOSIS — E785 Hyperlipidemia, unspecified: Secondary | ICD-10-CM | POA: Insufficient documentation

## 2016-09-15 DIAGNOSIS — Z7982 Long term (current) use of aspirin: Secondary | ICD-10-CM | POA: Insufficient documentation

## 2016-09-15 DIAGNOSIS — G4733 Obstructive sleep apnea (adult) (pediatric): Secondary | ICD-10-CM | POA: Insufficient documentation

## 2016-09-15 DIAGNOSIS — Z880 Allergy status to penicillin: Secondary | ICD-10-CM | POA: Insufficient documentation

## 2016-09-15 DIAGNOSIS — J9610 Chronic respiratory failure, unspecified whether with hypoxia or hypercapnia: Secondary | ICD-10-CM | POA: Diagnosis not present

## 2016-09-15 DIAGNOSIS — I2721 Secondary pulmonary arterial hypertension: Secondary | ICD-10-CM

## 2016-09-15 DIAGNOSIS — I272 Pulmonary hypertension, unspecified: Secondary | ICD-10-CM

## 2016-09-15 DIAGNOSIS — D119 Benign neoplasm of major salivary gland, unspecified: Secondary | ICD-10-CM | POA: Insufficient documentation

## 2016-09-15 HISTORY — PX: CARDIAC CATHETERIZATION: SHX172

## 2016-09-15 LAB — BASIC METABOLIC PANEL
ANION GAP: 12 (ref 5–15)
BUN: 17 mg/dL (ref 6–20)
CO2: 35 mmol/L — ABNORMAL HIGH (ref 22–32)
Calcium: 10.8 mg/dL — ABNORMAL HIGH (ref 8.9–10.3)
Chloride: 90 mmol/L — ABNORMAL LOW (ref 101–111)
Creatinine, Ser: 1.03 mg/dL — ABNORMAL HIGH (ref 0.44–1.00)
GFR calc Af Amer: >60 mL/min (ref >60)
GFR, EST NON AFRICAN AMERICAN: 57 mL/min — AB (ref >60)
Glucose, Bld: 111 mg/dL — ABNORMAL HIGH (ref 65–99)
POTASSIUM: 3.3 mmol/L — AB (ref 3.5–5.1)
SODIUM: 137 mmol/L (ref 135–145)

## 2016-09-15 SURGERY — RIGHT HEART CATH

## 2016-09-15 MED ORDER — SODIUM CHLORIDE 0.9% FLUSH: 3.0000 mL | Freq: Two times a day (BID) | INTRAVENOUS | Status: DC

## 2016-09-15 MED ORDER — LIDOCAINE HCL (PF) 1 % IJ SOLN
INTRAMUSCULAR | Status: DC | PRN
  Administered 2016-09-15: 2 mL

## 2016-09-15 MED ORDER — ASPIRIN 81 MG PO CHEW: 81.0000 mg | CHEWABLE_TABLET | ORAL | Status: DC

## 2016-09-15 MED ORDER — SODIUM CHLORIDE 0.9% FLUSH: 3.0000 mL | INTRAVENOUS | Status: DC | PRN

## 2016-09-15 MED ORDER — SODIUM CHLORIDE 0.9 % IV SOLN: INTRAVENOUS | Status: DC

## 2016-09-15 MED ORDER — ONDANSETRON HCL 4 MG/2ML IJ SOLN: 4.0000 mg | Freq: Four times a day (QID) | INTRAMUSCULAR | Status: DC | PRN

## 2016-09-15 MED ORDER — SODIUM CHLORIDE 0.9 % IV SOLN: 250.0000 mL | INTRAVENOUS | Status: DC | PRN

## 2016-09-15 MED ORDER — LIDOCAINE HCL (PF) 1 % IJ SOLN
INTRAMUSCULAR | Status: AC
  Filled 2016-09-15: qty 30

## 2016-09-15 MED ORDER — HEPARIN (PORCINE) IN NACL 2-0.9 UNIT/ML-% IJ SOLN
INTRAMUSCULAR | Status: AC
  Filled 2016-09-15: qty 500

## 2016-09-15 MED ORDER — ACETAMINOPHEN 325 MG PO TABS: 650.0000 mg | ORAL_TABLET | ORAL | Status: DC | PRN

## 2016-09-15 SURGICAL SUPPLY — 9 items
CATH BALLN WEDGE 5F 110CM (CATHETERS) ×3 IMPLANT
GUIDEWIRE .025 260CM (WIRE) ×3 IMPLANT
KIT HEART LEFT (KITS) ×3 IMPLANT
KIT HEART RIGHT NAMIC (KITS) ×3 IMPLANT
PACK CARDIAC CATHETERIZATION (CUSTOM PROCEDURE TRAY) ×3 IMPLANT
SHEATH FAST CATH BRACH 5F 5CM (SHEATH) ×3 IMPLANT
SYR MEDRAD MARK V 150ML (SYRINGE) ×3 IMPLANT
TRANSDUCER W/STOPCOCK (MISCELLANEOUS) ×6 IMPLANT
TUBING CIL FLEX 10 FLL-RA (TUBING) ×3 IMPLANT

## 2016-09-15 NOTE — H&P (View-Only) (Signed)
Patient ID: [name], female   DOB: Oct 12, 1955, 61 y.o.   MRN: [id number]  ADVANCED HF CLINIC NOTE  Patient ID: Dawnisha Marquina, female   DOB: 1956-05-03, 61 y.o.   MRN: [id number] PCP: Dr Frann [place] [phone]  HPI Mrs Holtzclaw is a 61 year old female with history of obesity, severe COPD, mixed PAH and diastolic dysfunction.   Initial presentation was to [facility] in 2012 with acute on chronic respiratory failure. Based on the CT scan it was felt that the patient had severe pulmonary hypertension and was referred for diagnostic cardiac catheterization. Coronaries were normal. Her PA pressures and mean pulmonary artery pressure were markedly elevated. She was found to have severe obstructive/restrictive lung disease but with very little response to bronchodilator therapy. VQ and CT negative for PE. She was then subsequently referred to Dr. Koleen Nimrod. Her PH was felt to be out of proportion to her COPD and she was started on PDE-5 inhibitor and macitentan was added, She has been intolerant of Ventavis due to cough.   Admitted from clinic 07/29/15 with marked volume overload and SOB with minimal activity and O2 desaturations into the 70-80s in clinic despite home O2.  Echo 07/30/15 with severe Pulmonary hypertension (PA peak pressure 115 mm Hg) and RV severely dilated.Improved with aggressive diuresis and [facility] 07/2815 showed adequate diuresis, normal coronaries, and significant improved of PA pressures from previous cath (full report below).  Left parotid mass noted on CT with c/o of left neck fullness. 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 Dr Benjamine Mola consulted for outpatient follow up. Diuresed well on IV lasix. Discharge weight 230 lbs.  Here for routine HF f/u. Has been on Adcirca and macitentan. Continues to struggle with severe DOE with almost any exertion. Wearing o2. Has kept fluid off. Weight at home stable. Takes torsemide 40 bid. Takes  metolazone when weight hits 230 only has to do this about 1x/week. Using O2 more regularly. Not using CPAP. Taking all medications. Has not been smoking.   Initial RHC April 02, 2011.  RA mean of 25 RV pressure is 117/13 PA 112/54 with mean of 74.  CO 5.8 liters. CI 3.3 liters.   PVR approximately 13 Woods units. Started on sildenafil (switched to tadalafil)  July 2012 pulmonary function test FVC 1.17 L per minute 37% of predicted, FEV1 1 L 40% of predicted. DLCO 61%  ECHO 06/2011 EF 60-65%. RV moderately dilated and mild to moderately hypokinetic with septal flattening RVSP 77 ECHO 10/02/12 Poor quality. EF 60-65% RV does appear better.  ECHO 1/15 EF 65% RV mildly dilated/HK D-shaped septum RVSP 72 ECHO 11/16 EF 60% RVSP 115 (but 70 on follow-up cath)  Echo 12/17 EF 60% RV markedly dilated but function relatively preserved   07/12/11 6MW at pulmonary rehab unchanged from August 2012. 900 feet. 04/26/12  6MW 870 feet O2 sats 93-94% on 2 liters [place]. 10/02/12 6MW 830 ft on 2L O2 with sats 87-90% 04/16/13            6MW 760 ft on 2L O2 sats 89-93% 01/21/14            6MW 600 ft on 2L O2 sats 88-91%  Unable to do further 6MW  10/06/12 [facility] on adcirca and macitentan  RA = 15  RV = 91/15/20  PA = 90/41 (64)  PCW = 20  Fick cardiac output/index = 5.2/2.5  PVR = 8.4 Woods  FA sat = 95%  PA sat = 69%, 73%  High SVC sat = 71%  08/04/15 RHC on adcirca and macitentan RA = 14 RV = 70/11/16 PA = 73/31 (52) PCW = 15 Ao = 91/55 (68) LV = 99/6/20 Fick cardiac output/index = 5.5/2.7 PVR = 6.8 WU SVR = 791 dynes FA sat = 96% PA sat = 66%, 67%  08/04/15 LHC  Normal coronaries with separate ostia for LAD and LCX    Allergies  Allergen Reactions  . Bactrim     Face and eye swelling  . Morphine And Related Other (See Comments)    Severe headache  . Penicillins     "doesn't work" got a lot as a child that doesn't work. But no systemic reaction.  . Sulfa Antibiotics Itching   Current  Outpatient Prescriptions on File Prior to Encounter  Medication Sig Dispense Refill  . acetaminophen (TYLENOL) 500 MG tablet Take 1,000 mg by mouth every 6 (six) hours as needed. For pain    . ADCIRCA 20 MG TABS Take 2 tablets by mouth  daily 180 tablet 3  . albuterol (PROVENTIL HFA;VENTOLIN HFA) 108 (90 BASE) MCG/ACT inhaler Inhale 2 puffs into the lungs every 4 (four) hours as needed for wheezing or shortness of breath.    Marland Kitchen aspirin 81 MG EC tablet Take 81 mg by mouth daily.      . Aspirin-Acetaminophen-Caffeine (GOODY HEADACHE PO) Take 1 Package by mouth daily as needed (Severe Headaches).     Marland Kitchen ibuprofen (ADVIL,MOTRIN) 200 MG tablet Take 200 mg by mouth every 6 (six) hours as needed for moderate pain.    . Macitentan (OPSUMIT) 10 MG TABS Take 10 mg by mouth daily. 90 tablet 3  . metolazone (ZAROXOLYN) 5 MG tablet Take 1 tablet (5 mg total) by mouth as needed. For weights 235 or greater with an extra 20 meq of 15 tablet 3  . spironolactone (ALDACTONE) 25 MG tablet Take 1 tablet (25 mg total) by mouth daily. 90 tablet 3  . traMADol (ULTRAM) 50 MG tablet Take 50 mg by mouth every 12 (twelve) hours as needed for moderate pain. Reported on 11/10/2015     No current facility-administered medications on file prior to encounter.     Past Medical History:  Diagnosis Date  . Body mass index 40.0-44.9, adult ([place])   . Cardiomegaly    Normal LV systolic function ejection fraction 55-60% with moderate decrease in RV function.  . Chronic obstructive asthma with exacerbation (HCC)    CT scan was a pattern but no evidence of pulmonary embolus and., Negative VQ scan July 2012 pulmonary function test FVC 1.17 L per minute 37% of predicted, FEV1 1 L 40% of predicted. DLCO 61% of predicted no response to bronchodilators negative HIV serology, ANA in scleroderma.  . Edema   . Hepatomegaly   . Obesity, unspecified   . Obstructive sleep apnea    BiPAP initiated July 2012  . Other and unspecified  hyperlipidemia   . Other chronic pulmonary heart diseases   . Other diseases of lung, not elsewhere classified   . Pulmonary arterial hypertension     PA pressure 112/54 mmHg the mean of 74 mmHg, right atrial pressure mean 25 mmHg. Pulmonary vascular resistance 13 with units. Cardiac output 5.8 L. Normal coronary arteries with normal LV function July 2012   . SOB (shortness of breath) on exertion   . Tobacco use disorder    PHYSICAL EXAM Vitals:   08/31/16 1301  BP: 118/67  BP Location: Left Arm  Patient Position: Sitting  Cuff Size: Large  Pulse: 97  SpO2: 90%  Weight: 223 lb (101.2 kg)     General: Sitting in clinic. No dyspnea.  Husband present HEENT: normal  Neck: supple. Thick. JVP hard to see. Appears mildly elevated  Carotids 2+ bilat; no bruits. No lymphadenopathy or thryomegaly appreciated.  Cor: PMI nonpalpable. Distant. Regular rate and rhythm. increased P2. Soft TR no RV lift Lungs: diminished throughout 2 liters [place] Abdomen: obese, NT. nondistended. Good bowel sounds.  Extremities: no cyanosis, clubbing, rash, no  edema.   Neuro: alert & oriented x 3, cranial nerves grossly intact. moves all 4 extremities w/o difficulty. Affect pleasant.  ASSESSMENT AND PLAN  1) Chronic diastolic HF: - Volume status ok - Continue current diuretic regimen - Check labs today 2) PAH: Mixed.  She has severe COPD with a component of group 3 PAH but also Group 1 PAH with significant improvement in PA pressures and symptoms with vasodilators.  Worsening Class IIIB symptoms on macitentan and Adcirca.  - Echo reviewed today with ongoing RH strain  - Will repeat RHC and PFTs with eye toward starting selexipag - Continue O2 and CPAP 3) COPD on home O2: Continuous 2 liters [place].  4) OSA-  Encouraged to use CPAP nightly 5) Morbid obesity - Encouraged weight loss.  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 - Saw Dr Benjamine Mola and [facility]. Tumor felt to be small. Just watchful  waiting.    Alasdair Kleve,MD 1:21 PM

## 2016-09-15 NOTE — Discharge Instructions (Signed)
Angiogram, Care After Refer to this sheet in the next few weeks. These instructions provide you with information about caring for yourself after your procedure. Your health care provider may also give you more specific instructions. Your treatment has been planned according to current medical practices, but problems sometimes occur. Call your health care provider if you have any problems or questions after your procedure. What can I expect after the procedure? After your procedure, it is typical to have the following:  Bruising at the catheter insertion site that usually fades within 1-2 weeks.  Blood collecting in the tissue (hematoma) that may be painful to the touch. It should usually decrease in size and tenderness within 1-2 weeks. Follow these instructions at home:  Take medicines only as directed by your health care provider.  You may shower 24-48 hours after the procedure or as directed by your health care provider. Remove the bandage (dressing) and gently wash the site with plain soap and water. Pat the area dry with a clean towel. Do not rub the site, because this may cause bleeding.  Do not take baths, swim, or use a hot tub until your health care provider approves.  Check your insertion site every day for redness, swelling, or drainage.  Do not apply powder or lotion to the site.  Do not lift over 10 lb (4.5 kg) for 5 days after your procedure or as directed by your health care provider.  Ask your health care provider when it is okay to:  Return to work or school.  Resume usual physical activities or sports.  Resume sexual activity.  Do not drive home if you are discharged the same day as the procedure. Have someone else drive you.  You may drive 24 hours after the procedure unless otherwise instructed by your health care provider.  Do not operate machinery or power tools for 24 hours after the procedure or as directed by your health care provider.  If your procedure  was done as an outpatient procedure, which means that you went home the same day as your procedure, a responsible adult should be with you for the first 24 hours after you arrive home.  Keep all follow-up visits as directed by your health care provider. This is important. Contact a health care provider if:  You have a fever.  You have chills.  You have increased bleeding from the catheter insertion site. Hold pressure on the site. Get help right away if:  You have unusual pain at the catheter insertion site.  You have redness, warmth, or swelling at the catheter insertion site.  You have drainage (other than a small amount of blood on the dressing) from the catheter insertion site.  The catheter insertion site is bleeding, and the bleeding does not stop after 30 minutes of holding steady pressure on the site.  The area near or just beyond the catheter insertion site becomes pale, cool, tingly, or numb. This information is not intended to replace advice given to you by your health care provider. Make sure you discuss any questions you have with your health care provider. Document Released: 03/11/2005 Document Revised: 01/29/2016 Document Reviewed: 01/24/2013 Elsevier Interactive Patient Education  2017 [employer].

## 2016-09-15 NOTE — Interval H&P Note (Signed)
History and Physical Interval Note:  09/15/2016 11:14 AM  Alexis [name]  has presented today for surgery, with the diagnosis of PAH  The various methods of treatment have been discussed with the patient and family. After consideration of risks, benefits and other options for treatment, the patient has consented to  Procedure(s): Right Heart Cath (N/A) as a surgical intervention .  The patient's history has been reviewed, patient examined, no change in status, stable for surgery.  I have reviewed the patient's chart and labs.  Questions were answered to the patient's satisfaction.     Bensimhon, Quillian Quince

## 2016-09-16 ENCOUNTER — Encounter (HOSPITAL_COMMUNITY): Payer: Self-pay | Admitting: Internal Medicine

## 2016-09-16 LAB — POCT I-STAT 3, VENOUS BLOOD GAS (G3P V)
ACID-BASE EXCESS: 11 mmol/L — AB (ref 0.0–2.0)
ACID-BASE EXCESS: 9 mmol/L — AB (ref 0.0–2.0)
Acid-Base Excess: 10 mmol/L — ABNORMAL HIGH (ref 0.0–2.0)
Acid-Base Excess: 12 mmol/L — ABNORMAL HIGH (ref 0.0–2.0)
Acid-Base Excess: 14 mmol/L — ABNORMAL HIGH (ref 0.0–2.0)
BICARBONATE: 36.3 mmol/L — AB (ref 20.0–28.0)
BICARBONATE: 38.6 mmol/L — AB (ref 20.0–28.0)
BICARBONATE: 38.9 mmol/L — AB (ref 20.0–28.0)
Bicarbonate: 39.6 mmol/L — ABNORMAL HIGH (ref 20.0–28.0)
Bicarbonate: 41.6 mmol/L — ABNORMAL HIGH (ref 20.0–28.0)
O2 SAT: 63 %
O2 Saturation: 65 %
O2 Saturation: 71 %
O2 Saturation: 72 %
O2 Saturation: 75 %
PCO2 VEN: 58 mmHg (ref 44.0–60.0)
PCO2 VEN: 59 mmHg (ref 44.0–60.0)
PCO2 VEN: 63.5 mmHg — AB (ref 44.0–60.0)
PCO2 VEN: 63.8 mmHg — AB (ref 44.0–60.0)
PH VEN: 7.392 (ref 7.250–7.430)
PH VEN: 7.434 — AB (ref 7.250–7.430)
PO2 VEN: 37 mmHg (ref 32.0–45.0)
TCO2: 38 mmol/L (ref 0–100)
TCO2: 41 mmol/L (ref 0–100)
TCO2: 41 mmol/L (ref 0–100)
TCO2: 41 mmol/L (ref 0–100)
TCO2: 43 mmol/L (ref 0–100)
pCO2, Ven: 56.9 mmHg (ref 44.0–60.0)
pH, Ven: 7.4 (ref 7.250–7.430)
pH, Ven: 7.412 (ref 7.250–7.430)
pH, Ven: 7.457 — ABNORMAL HIGH (ref 7.250–7.430)
pO2, Ven: 34 mmHg (ref 32.0–45.0)
pO2, Ven: 35 mmHg (ref 32.0–45.0)
pO2, Ven: 38 mmHg (ref 32.0–45.0)
pO2, Ven: 40 mmHg (ref 32.0–45.0)

## 2016-09-21 MED FILL — Heparin Sodium (Porcine) 2 Unit/ML in Sodium Chloride 0.9%: INTRAMUSCULAR | Qty: 500 | Status: AC

## 2016-09-22 ENCOUNTER — Encounter (HOSPITAL_COMMUNITY): Payer: Medicare Other

## 2016-10-05 ENCOUNTER — Encounter (HOSPITAL_COMMUNITY): Payer: Medicare Other

## 2016-10-12 ENCOUNTER — Ambulatory Visit (HOSPITAL_COMMUNITY)
Admission: RE | Admit: 2016-10-12 | Discharge: 2016-10-12 | Disposition: A | Discharge status: Home / Self Care | Payer: Medicare Other | Source: Ambulatory Visit | Attending: Internal Medicine | Admitting: Internal Medicine

## 2016-10-12 DIAGNOSIS — I2721 Secondary pulmonary arterial hypertension: Secondary | ICD-10-CM | POA: Insufficient documentation

## 2016-10-12 LAB — PULMONARY FUNCTION TEST
DL/VA % pred: 94 %
DL/VA: 4.31 ml/min/mmHg/L
DLCO UNC: 11.37 ml/min/mmHg
DLCO unc % pred: 52 %
FEF 25-75 Post: 0.28 L/sec
FEF 25-75 Pre: 0.33 L/sec
FEF2575-%Change-Post: -15 %
FEF2575-%Pred-Post: 13 %
FEF2575-%Pred-Pre: 15 %
FEV1-%CHANGE-POST: -2 %
FEV1-%PRED-PRE: 29 %
FEV1-%Pred-Post: 28 %
FEV1-POST: 0.66 L
FEV1-Pre: 0.67 L
FEV1FVC-%Change-Post: 18 %
FEV1FVC-%PRED-PRE: 87 %
FEV6-%Change-Post: -17 %
FEV6-%PRED-POST: 27 %
FEV6-%Pred-Pre: 33 %
FEV6-POST: 0.81 L
FEV6-PRE: 0.98 L
FEV6FVC-%CHANGE-POST: 0 %
FEV6FVC-%PRED-POST: 104 %
FEV6FVC-%PRED-PRE: 103 %
FVC-%Change-Post: -17 %
FVC-%Pred-Post: 26 %
FVC-%Pred-Pre: 32 %
FVC-Post: 0.81 L
FVC-Pre: 0.98 L
PRE FEV6/FVC RATIO: 100 %
Post FEV1/FVC ratio: 81 %
Post FEV6/FVC ratio: 100 %
Pre FEV1/FVC ratio: 68 %
RV % PRED: 124 %
RV: 2.4 L
TLC % PRED: 76 %
TLC: 3.64 L

## 2016-10-12 MED ORDER — ALBUTEROL SULFATE (2.5 MG/3ML) 0.083% IN NEBU
2.5000 mg | INHALATION_SOLUTION | Freq: Once | RESPIRATORY_TRACT | Status: AC
  Administered 2016-10-12: 2.5 mg via RESPIRATORY_TRACT

## 2016-10-21 ENCOUNTER — Telehealth (HOSPITAL_COMMUNITY): Payer: Self-pay | Admitting: Vascular Surgery

## 2016-10-21 NOTE — Telephone Encounter (Signed)
Pt left message on scheduling line stating that someone from this office call her and did not leave a message, she states she has been trying to contact the doctor since last week , so please can someone call her back.. Please advise

## 2016-10-22 NOTE — Telephone Encounter (Signed)
I tried calling her yesterday and there was no answer, I have sent Dr. Haroldine Laws a message regarding her PFT results. I called her again this morning and left VM letting her know I'm waiting for final results and will call her when they are available.

## 2016-12-07 ENCOUNTER — Telehealth (HOSPITAL_COMMUNITY): Payer: Self-pay | Admitting: *Deleted

## 2016-12-07 NOTE — Telephone Encounter (Signed)
Very severe lung disease. Slightly worse than previous.

## 2016-12-07 NOTE — Telephone Encounter (Signed)
Patient has called triage line and left message asking for her PFT results from her test on 10/12/16.  I will send to Dr. Haroldine Laws to review and call patient back with the results.

## 2016-12-09 NOTE — Telephone Encounter (Signed)
Spoke w/pt, she is aware of results

## 2016-12-22 ENCOUNTER — Other Ambulatory Visit (HOSPITAL_COMMUNITY): Payer: Self-pay | Admitting: *Deleted

## 2016-12-22 MED ORDER — MACITENTAN 10 MG PO TABS: 10.0000 mg | ORAL_TABLET | Freq: Every day | ORAL | 3 refills | Status: DC

## 2016-12-23 ENCOUNTER — Other Ambulatory Visit (HOSPITAL_COMMUNITY): Payer: Self-pay | Admitting: *Deleted

## 2016-12-23 ENCOUNTER — Telehealth (HOSPITAL_COMMUNITY): Payer: Self-pay | Admitting: Vascular Surgery

## 2016-12-23 MED ORDER — MACITENTAN 10 MG PO TABS: 10.0000 mg | ORAL_TABLET | Freq: Every day | ORAL | 3 refills | Status: DC

## 2016-12-23 MED ORDER — MACITENTAN 10 MG PO TABS: 10.0000 mg | ORAL_TABLET | Freq: Every day | ORAL | 11 refills | Status: DC

## 2016-12-23 NOTE — Addendum Note (Signed)
Addended by: Scarlette Calico on: 12/23/2016 02:54 PM   Modules accepted: Orders

## 2016-12-23 NOTE — Telephone Encounter (Signed)
RX for Opsumit sent electronically to [facility], RX also faxed to them at [phone] per pt request.

## 2016-12-23 NOTE — Telephone Encounter (Signed)
Pt needs refill faxed in for Opsumit sent to [facility] fax # [phone] . Susie sent yesterday to [facility] pt does not use pharmacy anymore.. Please advise

## 2016-12-29 MED ORDER — MACITENTAN 10 MG PO TABS: 10.0000 mg | ORAL_TABLET | Freq: Every day | ORAL | 11 refills | Status: DC

## 2016-12-29 NOTE — Telephone Encounter (Signed)
This has been addressed, will sent a second copy just in case

## 2017-01-26 ENCOUNTER — Encounter (HOSPITAL_COMMUNITY): Payer: Medicare Other | Admitting: Internal Medicine

## 2017-02-24 ENCOUNTER — Telehealth (HOSPITAL_COMMUNITY): Payer: Self-pay | Admitting: Vascular Surgery

## 2017-02-24 ENCOUNTER — Ambulatory Visit (HOSPITAL_COMMUNITY)
Admission: RE | Admit: 2017-02-24 | Discharge: 2017-02-24 | Disposition: A | Discharge status: Home / Self Care | Payer: Medicare Other | Source: Ambulatory Visit | Attending: Internal Medicine | Admitting: Internal Medicine

## 2017-02-24 VITALS — BP 134/60 | HR 103 | Wt 227.8 lb

## 2017-02-24 DIAGNOSIS — Z79899 Other long term (current) drug therapy: Secondary | ICD-10-CM | POA: Insufficient documentation

## 2017-02-24 DIAGNOSIS — R942 Abnormal results of pulmonary function studies: Secondary | ICD-10-CM

## 2017-02-24 DIAGNOSIS — J449 Chronic obstructive pulmonary disease, unspecified: Secondary | ICD-10-CM | POA: Diagnosis not present

## 2017-02-24 DIAGNOSIS — Z880 Allergy status to penicillin: Secondary | ICD-10-CM | POA: Insufficient documentation

## 2017-02-24 DIAGNOSIS — R002 Palpitations: Secondary | ICD-10-CM | POA: Diagnosis not present

## 2017-02-24 DIAGNOSIS — D119 Benign neoplasm of major salivary gland, unspecified: Secondary | ICD-10-CM | POA: Diagnosis not present

## 2017-02-24 DIAGNOSIS — Z9981 Dependence on supplemental oxygen: Secondary | ICD-10-CM | POA: Insufficient documentation

## 2017-02-24 DIAGNOSIS — I5032 Chronic diastolic (congestive) heart failure: Secondary | ICD-10-CM | POA: Diagnosis not present

## 2017-02-24 DIAGNOSIS — Z7982 Long term (current) use of aspirin: Secondary | ICD-10-CM | POA: Insufficient documentation

## 2017-02-24 DIAGNOSIS — G4733 Obstructive sleep apnea (adult) (pediatric): Secondary | ICD-10-CM | POA: Diagnosis not present

## 2017-02-24 DIAGNOSIS — I2721 Secondary pulmonary arterial hypertension: Secondary | ICD-10-CM

## 2017-02-24 DIAGNOSIS — Z6841 Body Mass Index (BMI) 40.0 and over, adult: Secondary | ICD-10-CM | POA: Diagnosis not present

## 2017-02-24 LAB — BASIC METABOLIC PANEL
Anion gap: 11 (ref 5–15)
BUN: 18 mg/dL (ref 6–20)
CALCIUM: 9.6 mg/dL (ref 8.9–10.3)
CO2: 31 mmol/L (ref 22–32)
CREATININE: 1.07 mg/dL — AB (ref 0.44–1.00)
Chloride: 97 mmol/L — ABNORMAL LOW (ref 101–111)
GFR calc non Af Amer: 54 mL/min — ABNORMAL LOW (ref >60)
Glucose, Bld: 104 mg/dL — ABNORMAL HIGH (ref 65–99)
Potassium: 3.6 mmol/L (ref 3.5–5.1)
SODIUM: 139 mmol/L (ref 135–145)

## 2017-02-24 LAB — MAGNESIUM: Magnesium: 2 mg/dL (ref 1.7–2.4)

## 2017-02-24 NOTE — Progress Notes (Signed)
Patient ID: Alexis [name], female   DOB: 1956/04/15, 61 y.o.   MRN: [id number]  ADVANCED HF CLINIC NOTE  Patient ID: Alexis [name], female   DOB: 03-30-1956, 61 y.o.   MRN: [id number] PCP: Dr Frann Rider [place] [id number]  HPI Alexis [name] is a 61 year old female with history of obesity, severe COPD, mixed PAH and diastolic dysfunction.   Initial presentation was to [facility] in 2012 with acute on chronic respiratory failure. Based on the CT scan it was felt that the patient had severe pulmonary hypertension and was referred for diagnostic cardiac catheterization. Coronaries were normal. Her PA pressures and mean pulmonary artery pressure were markedly elevated. She was found to have severe obstructive/restrictive lung disease but with very little response to bronchodilator therapy. VQ and CT negative for PE. She was then subsequently referred to Dr. Koleen Nimrod. Her PH was felt to be out of proportion to her COPD and she was started on PDE-5 inhibitor and macitentan was added, She has been intolerant of Ventavis due to cough.   Admitted from clinic 07/29/15 with marked volume overload and SOB with minimal activity and O2 desaturations into the 70-80s in clinic despite home O2.  Echo 07/30/15 with severe Pulmonary hypertension (PA peak pressure 115 mm Hg) and RV severely dilated.Improved with aggressive diuresis and [facility] 07/2815 showed adequate diuresis, normal coronaries, and significant improved of PA pressures from previous cath (full report below).  Left parotid mass noted on CT with c/o of left neck fullness. 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 Dr Benjamine Mola consulted for outpatient follow up. Diuresed well on IV lasix. Discharge weight 230 lbs.  Here for routine HF f/u. Has been on Adcirca and macitentan. Says she has good days and bad days. Says she likes to eat crackers and soup. Last week had some extra fluid. Took metolazone with improvement then swelling  came back. Then though swelling was due to macitentan so stopped for a few days and ankle swelling improved. Continues with severe SOB with any exertion. Having occasional heart skipping. Using O2 at night but not using CPAP. Has not been smoking.   RHC 09/15/16  RA = 13 RV = 106/20 PA = 108/52 (65) PCW = 12 Fick cardiac output/index = 5.7/2.8 PVR = 11.0 WU Ao sat = 93% PA sat = 71%, 72% SVC sat = 65% RA sat = 75  PFTs 10/12/16  FEV1 0.67L (29%) FVC  0.81 (32%) DLCO 52%  July 2012 pulmonary function test FVC 1.17 L per minute 37% of predicted, FEV1 1 L 40% of predicted. DLCO 61%  ECHO 06/2011 EF 60-65%. RV moderately dilated and mild to moderately hypokinetic with septal flattening RVSP 77 ECHO 10/02/12 Poor quality. EF 60-65% RV does appear better.  ECHO 1/15 EF 65% RV mildly dilated/HK D-shaped septum RVSP 72 ECHO 11/16 EF 60% RVSP 115 (but 70 on follow-up cath)  Echo 12/17 EF 60% RV markedly dilated but function relatively preserved   07/12/11 6MW at pulmonary rehab unchanged from August 2012. 900 feet. 04/26/12  6MW 870 feet O2 sats 93-94% on 2 liters [place]. 10/02/12 6MW 830 ft on 2L O2 with sats 87-90% 04/16/13            6MW 760 ft on 2L O2 sats 89-93% 01/21/14            6MW 600 ft on 2L O2 sats 88-91%  Unable to do further 6MW  10/06/12 [place] on adcirca and macitentan  RA = 15  RV = 91/15/20  PA = 90/41 (64)  PCW = 20  Fick cardiac output/index = 5.2/2.5  PVR = 8.4 [place]  FA sat = 95%  PA sat = 69%, 73%  High SVC sat = 71%  08/04/15 RHC on adcirca and macitentan RA = 14 RV = 70/11/16 PA = 73/31 (52) PCW = 15 Ao = 91/55 (68) LV = 99/6/20 Fick cardiac output/index = 5.5/2.7 PVR = 6.8 WU SVR = 791 dynes FA sat = 96% PA sat = 66%, 67%  08/04/15 LHC  Normal coronaries with separate ostia for LAD and LCX  Initial RHC April 02, 2011.  RA mean of 25 RV pressure is 117/13 PA 112/54 with mean of 74.  CO 5.8 liters. CI 3.3 liters.   PVR approximately 13 [place] units.  Started on sildenafil (switched to tadalafil)   Allergies  Allergen Reactions  . Bactrim     Face and eye swelling  . Cinnamon     wheezing  . Morphine And Related Other (See Comments)    Severe headache  . Penicillins     "doesn't work" got a lot as a child that doesn't work. But no systemic reaction.  . Sulfa Antibiotics Itching  . Valdecoxib     Face swelling   Current Outpatient Prescriptions on File Prior to Encounter  Medication Sig Dispense Refill  . acetaminophen (TYLENOL) 500 MG tablet Take 1,000 mg by mouth every 6 (six) hours as needed. For pain    . ADCIRCA 20 MG TABS Take 2 tablets by mouth  daily 180 tablet 3  . albuterol (PROVENTIL HFA;VENTOLIN HFA) 108 (90 BASE) MCG/ACT inhaler Inhale 2 puffs into the lungs every 4 (four) hours as needed for wheezing or shortness of breath.    Marland Kitchen alum & mag hydroxide-simeth (MAALOX/MYLANTA) 200-200-20 MG/5ML suspension Take 30 mLs by mouth every 6 (six) hours as needed for indigestion or heartburn.    Marland Kitchen aspirin 81 MG EC tablet Take 81 mg by mouth daily.      . Aspirin-Acetaminophen-Caffeine (GOODY HEADACHE PO) Take 1 Package by mouth daily as needed (Severe Headaches).     . Calcium Carbonate Antacid (ALKA-SELTZER ANTACID PO) Take 1 tablet by mouth daily as needed (heartburn).    Marland Kitchen linaclotide (LINZESS) 72 MCG capsule Take 72 mcg by mouth daily before breakfast.     . Macitentan (OPSUMIT) 10 MG TABS Take 1 tablet (10 mg total) by mouth daily. 30 tablet 11  . metolazone (ZAROXOLYN) 5 MG tablet Take 1 tablet (5 mg total) by mouth as needed. For weights 235 or greater with an extra 20 meq of 15 tablet 3  . potassium chloride SA (K-DUR,KLOR-CON) 20 MEQ tablet Take 3 tablets (60 mEq total) by mouth daily. Take extra 49mq if you take extra torsemide. Take 80 mg twice daily on metolazone days 90 tablet 3  . spironolactone (ALDACTONE) 25 MG tablet Take 1 tablet (25 mg total) by mouth daily. 90 tablet 3  . torsemide (DEMADEX) 20 MG tablet Take 40  mg by mouth daily. May take an additional 40 mg in pm as needed for swelling / weight gain     No current facility-administered medications on file prior to encounter.     Past Medical History:  Diagnosis Date  . Body mass index 40.0-44.9, adult ([place]   . Cardiomegaly    Normal LV systolic function ejection fraction 55-60% with moderate decrease in RV function.  . Chronic obstructive asthma with exacerbation ([place]    CT  scan was a pattern but no evidence of pulmonary embolus and., Negative VQ scan July 2012 pulmonary function test FVC 1.17 L per minute 37% of predicted, FEV1 1 L 40% of predicted. DLCO 61% of predicted no response to bronchodilators negative HIV serology, ANA in scleroderma.  . Edema   . Hepatomegaly   . Obesity, unspecified   . Obstructive sleep apnea    BiPAP initiated July 2012  . Other and unspecified hyperlipidemia   . Other chronic pulmonary heart diseases   . Other diseases of lung, not elsewhere classified   . Pulmonary arterial hypertension     PA pressure 112/54 mmHg the mean of 74 mmHg, right atrial pressure mean 25 mmHg. Pulmonary vascular resistance 13 with units. Cardiac output 5.8 L. Normal coronary arteries with normal LV function July 2012   . SOB (shortness of breath) on exertion   . Tobacco use disorder    PHYSICAL EXAM Vitals:   02/24/17 1409  BP: 134/60  Pulse: (!) 103  SpO2: 95%  Weight: 227 lb 12.8 oz (103.3 kg)     General: Sitting in clinic. Wearing O2 No dyspnea.  Husband present HEENT: normal x for poor dentition Neck: supple. Thick. JVP hard to see. Looks ok . Appears mildly elevated  Carotids 2+ bilat; no bruits. No lymphadenopathy or thryomegaly appreciated.  Cor: PMI nonpalpable. Distant. Regular rate and rhythm. increased P2. Soft TR no RV lift Lungs: diminished throughout 2 liters [place] Abdomen: obese NT/ND soft good BS Extremities: no cyanosis, clubbing, rash, edema Neuro: alert & oriented x 3, cranial nerves grossly intact.  moves all 4 extremities w/o difficulty. Affect pleasant  ECG NSR 93 biatrial enlargement RAD   ASSESSMENT AND PLAN  1) Chronic diastolic HF (R>>L HF): - Volume status ok - Continue current diuretic regimen. Do not stop macintentan. Take metolazone as needed - Check labs today 2) PAH: Mixed.  She has severe COPD with a component of group 3 PAH but also Group 1 PAH with significant improvement in PA pressures and symptoms with vasodilators.   - Stable NYHA III-IIIB symptoms on macitentan and Adcirca.  - Recent RHC with severe PAH (PAs > 100) but given severity of lung disease FEV1 0.69L Will not add selexapeg give risk of shunting.  - Continue O2 - Stressed need for CPAP - Refer back to Pulmonary for treatment of COPD and CPAP - she is nearing end-stage 3) COPD on home O2: Continuous 2 liters [place].  - PFTs very severe.Will refer back to Pulmonary for CPAP and Pulmonary eval.  4) OSA-  Encouraged to use CPAP nightly 5) Morbid obesity - Encouraged weight loss.  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 - Saw Dr Benjamine Mola and [facility] Tumor felt to be small. Just watchful waiting.  7. Palpitations - likely PVCs. Check K and mag today. Need to get back on CPAP    Alexis Wenzler,MD 2:40 PM

## 2017-02-24 NOTE — Progress Notes (Signed)
Advanced Heart Failure Medication Review by a Pharmacist  Does the patient  feel that his/her medications are working for him/her?  yes  Has the patient been experiencing any side effects to the medications prescribed?  yes  Does the patient measure his/her own blood pressure or blood glucose at home?  no   Does the patient have any problems obtaining medications due to transportation or finances?   no  Understanding of regimen: good Understanding of indications: good Potential of compliance: good Patient understands to avoid NSAIDs. Patient understands to avoid decongestants.  Issues to address at subsequent visits: [place] Swelling   Pharmacist comments: oMrs. Petree is a pleasant 61 y/o female who presents with no medication bottles or list but a good recollection of her medication regimen. She reports good adherence to her medications, but noted that she has ankle swelling and reports stopping her adcirca and opsumit for a week. She also reports taking her metolazone 4 days ago. Patient had no medication-related questions or concerns at this time.   Phillis Knack PharmD Candidate  Time with patient: 10 minutes Preparation and documentation time: 10 minutes Total time: 20 minutes

## 2017-02-24 NOTE — Telephone Encounter (Signed)
Left pt appt to see Pulm OCT 1

## 2017-02-24 NOTE — Patient Instructions (Signed)
Labs today  You have been referred to Pulmonary  We will contact you in 4 months to schedule your next appointment.

## 2017-03-04 ENCOUNTER — Encounter (HOSPITAL_COMMUNITY): Payer: Self-pay | Admitting: *Deleted

## 2017-03-04 ENCOUNTER — Telehealth (HOSPITAL_COMMUNITY): Payer: Self-pay | Admitting: *Deleted

## 2017-03-04 NOTE — Telephone Encounter (Signed)
Basic metabolic panel  Order: [id number]  Status:  Final result Visible to patient:  Yes (MyChart) Dx:  Chronic diastolic heart failure ([place])  Notes recorded by Kennieth Rad, RN on 03/04/2017 at 1:30 PM EDT Called patient again today and no answer and no VM. I will mail letter today. ------  Notes recorded by Kennieth Rad, RN on 02/28/2017 at 12:15 PM EDT Called patient again, no answer and no VM. ------  Notes recorded by Kennieth Rad, RN on 02/25/2017 at 4:01 PM EDT Called patient twice and no answer with no VM. Will continue trying to reach patient. ------  Notes recorded by Jolaine Artist, MD on 02/24/2017 at 6:04 PM EDT Have her take Kcl 40 meq x 1

## 2017-03-23 DIAGNOSIS — F419 Anxiety disorder, unspecified: Secondary | ICD-10-CM | POA: Diagnosis not present

## 2017-03-23 DIAGNOSIS — J449 Chronic obstructive pulmonary disease, unspecified: Secondary | ICD-10-CM | POA: Diagnosis not present

## 2017-03-23 DIAGNOSIS — E876 Hypokalemia: Secondary | ICD-10-CM | POA: Diagnosis not present

## 2017-03-23 DIAGNOSIS — I270 Primary pulmonary hypertension: Secondary | ICD-10-CM | POA: Diagnosis not present

## 2017-03-23 DIAGNOSIS — G4733 Obstructive sleep apnea (adult) (pediatric): Secondary | ICD-10-CM | POA: Diagnosis not present

## 2017-04-10 DIAGNOSIS — J209 Acute bronchitis, unspecified: Secondary | ICD-10-CM | POA: Diagnosis not present

## 2017-04-10 DIAGNOSIS — Z720 Tobacco use: Secondary | ICD-10-CM | POA: Diagnosis not present

## 2017-04-10 DIAGNOSIS — J40 Bronchitis, not specified as acute or chronic: Secondary | ICD-10-CM | POA: Diagnosis not present

## 2017-04-10 DIAGNOSIS — J45909 Unspecified asthma, uncomplicated: Secondary | ICD-10-CM | POA: Diagnosis not present

## 2017-04-10 DIAGNOSIS — Z7982 Long term (current) use of aspirin: Secondary | ICD-10-CM | POA: Diagnosis not present

## 2017-04-10 DIAGNOSIS — I272 Pulmonary hypertension, unspecified: Secondary | ICD-10-CM | POA: Diagnosis not present

## 2017-04-10 DIAGNOSIS — I509 Heart failure, unspecified: Secondary | ICD-10-CM | POA: Diagnosis not present

## 2017-04-10 DIAGNOSIS — E669 Obesity, unspecified: Secondary | ICD-10-CM | POA: Diagnosis not present

## 2017-04-10 DIAGNOSIS — R0902 Hypoxemia: Secondary | ICD-10-CM | POA: Diagnosis not present

## 2017-04-10 DIAGNOSIS — Z7901 Long term (current) use of anticoagulants: Secondary | ICD-10-CM | POA: Diagnosis not present

## 2017-04-10 DIAGNOSIS — Z79899 Other long term (current) drug therapy: Secondary | ICD-10-CM | POA: Diagnosis not present

## 2017-04-10 DIAGNOSIS — G9389 Other specified disorders of brain: Secondary | ICD-10-CM | POA: Diagnosis not present

## 2017-04-10 DIAGNOSIS — R079 Chest pain, unspecified: Secondary | ICD-10-CM | POA: Diagnosis not present

## 2017-04-10 DIAGNOSIS — R202 Paresthesia of skin: Secondary | ICD-10-CM | POA: Diagnosis not present

## 2017-04-10 DIAGNOSIS — F17200 Nicotine dependence, unspecified, uncomplicated: Secondary | ICD-10-CM | POA: Diagnosis not present

## 2017-05-20 ENCOUNTER — Telehealth (HOSPITAL_COMMUNITY): Payer: Self-pay | Admitting: *Deleted

## 2017-05-20 NOTE — Telephone Encounter (Signed)
Script for [employer] mailed to patients home address.

## 2017-06-06 ENCOUNTER — Telehealth: Payer: Self-pay | Admitting: Pulmonary Disease

## 2017-06-06 ENCOUNTER — Encounter: Payer: Self-pay | Admitting: Pulmonary Disease

## 2017-06-06 ENCOUNTER — Ambulatory Visit (INDEPENDENT_AMBULATORY_CARE_PROVIDER_SITE_OTHER): Payer: Medicare Other | Admitting: Pulmonary Disease

## 2017-06-06 VITALS — BP 114/76 | HR 86 | Ht 62.5 in | Wt 219.0 lb

## 2017-06-06 DIAGNOSIS — E662 Morbid (severe) obesity with alveolar hypoventilation: Secondary | ICD-10-CM

## 2017-06-06 DIAGNOSIS — J9611 Chronic respiratory failure with hypoxia: Secondary | ICD-10-CM | POA: Diagnosis not present

## 2017-06-06 DIAGNOSIS — Z23 Encounter for immunization: Secondary | ICD-10-CM

## 2017-06-06 DIAGNOSIS — G4733 Obstructive sleep apnea (adult) (pediatric): Secondary | ICD-10-CM

## 2017-06-06 DIAGNOSIS — J439 Emphysema, unspecified: Secondary | ICD-10-CM | POA: Diagnosis not present

## 2017-06-06 DIAGNOSIS — R0602 Shortness of breath: Secondary | ICD-10-CM

## 2017-06-06 MED ORDER — FLUTICASONE FUROATE-VILANTEROL 100-25 MCG/INH IN AEPB: 1.0000 | INHALATION_SPRAY | Freq: Every day | RESPIRATORY_TRACT | 0 refills | Status: DC

## 2017-06-06 NOTE — Progress Notes (Signed)
Subjective:    Patient ID: Alexis [name], female    DOB: 09/05/1956, 61 y.o.   MRN: [id number]  HPI  Chief Complaint  Patient presents with  . Sleep Consult    For possible OSA. Has already had a SS done. Does not use CPAP or bipap. Constantly wakes up at night (light sleeper).     61 year old heavy ex-smoker presents for evaluation of sleep-disordered breathing. She was diagnosed with severe pulmonary hypertension in 2012 and since then has been maintained on 2 L oxygen continuously. After right heart cath, she was felt to have WHO group 2 and 3 pulmonary hypertension but noted that he pressures are quite out of proportion to degree of airway obstruction or left heart disease. She has since also undergone evaluation at [facility] in [place]. She has been maintained on ad circa and macitentan and repeat right heart cath in 09/2016 still showed high pulmonary artery pressures.  She has also been seen by my partner Dr. [place] Bells who felt that she had obstructive airways disease and was placed on nebulized bronchodilators so she would be able to obtain this through part B, but this was very expensive for her with her co-pay. I reviewed polysomnogram from 2015 which showed mild OSA. She was maintained on auto CPAP machine at one point and download head showed a pressure of about 11 cm. I note ABG from 07/2015 of 7.43/65/81 which suggests obesity hypoventilation   Epworth sleepiness score is 6 and she reports excessive daytime fatigue. She has always had trouble falling asleep and maintaining her sleep. She's tried numerous over-the-counter medications as well as prescription medication such as Ambien. Bedtime is between 11 PM and 1 AM, sometimes sleep latency can be up to an hour, she sleeps on her back on her sides with 2 pillows, reports 2-3 nocturnal awakenings due to diuretics and nocturia and is out of bed only by 9 AM to 11 AM feeling fatigued without dryness of mouth or headaches.  Her weight has fluctuated within 10 pounds There is no history suggestive of cataplexy, sleep paralysis or parasomnias  She smoked 2 packs per day, more than 60 pack years refer she quit in 2012    Significant tests/ events reviewed Right heart cath 09/2016 PCWP 12, RA 13, PA pressure 108/52  NPSG 10/2013 HF 5/hour, RDI 49/hour, lowest desaturation 91% on 2 L oxygen  PFTs 10/2016 ratio 68, FEV1 0.67, 29%, FVC 0.98-32%, DLCO 52%     Past Medical History:  Diagnosis Date  . Body mass index 40.0-44.9, adult ([place])   . Cardiomegaly    Normal LV systolic function ejection fraction 55-60% with moderate decrease in RV function.  . Chronic obstructive asthma with exacerbation (HCC)    CT scan was a pattern but no evidence of pulmonary embolus and., Negative VQ scan July 2012 pulmonary function test FVC 1.17 L per minute 37% of predicted, FEV1 1 L 40% of predicted. DLCO 61% of predicted no response to bronchodilators negative HIV serology, ANA in scleroderma.  . Edema   . Hepatomegaly   . Obesity, unspecified   . Obstructive sleep apnea    BiPAP initiated July 2012  . Other and unspecified hyperlipidemia   . Other chronic pulmonary heart diseases   . Other diseases of lung, not elsewhere classified   . Pulmonary arterial hypertension (HCC)     PA pressure 112/54 mmHg the mean of 74 mmHg, right atrial pressure mean 25 mmHg. Pulmonary vascular resistance 13 with units. Cardiac  output 5.8 L. Normal coronary arteries with normal LV function July 2012   . SOB (shortness of breath) on exertion   . Tobacco use disorder    Past Surgical History:  Procedure Laterality Date  . CARDIAC CATHETERIZATION  04/02/2011  . CARDIAC CATHETERIZATION N/A 08/04/2015   Procedure: Right/Left Heart Cath and Coronary Angiography;  Surgeon: Jolaine Artist, MD;  Location: [facility];  Service: Cardiovascular;  Laterality: N/A;  . CARDIAC CATHETERIZATION N/A 09/15/2016   Procedure: Right Heart Cath;   Surgeon: Jolaine Artist, MD;  Location: [facility];  Service: Cardiovascular;  Laterality: N/A;  . CESAREAN SECTION    . DILATION AND CURETTAGE OF UTERUS    . RIGHT HEART CATHETERIZATION N/A 10/06/2012   Procedure: RIGHT HEART CATH;  Surgeon: Jolaine Artist, MD;  Location: [facility];  Service: Cardiovascular;  Laterality: N/A;  . TUBAL LIGATION      Allergies  Allergen Reactions  . Bactrim     Face and eye swelling  . Cinnamon     wheezing  . Morphine And Related Other (See Comments)    Severe headache  . Penicillins     "doesn't work" got a lot as a child that doesn't work. But no systemic reaction.  . Sulfa Antibiotics Itching  . Valdecoxib     Face swelling      Social History   Social History  . Marital status: Married    Spouse name: N/A  . Number of children: N/A  . Years of education: N/A   Occupational History  . disabled    Social History Main Topics  . Smoking status: Former Smoker    Packs/day: 1.00    Years: 40.00    Types: Cigarettes    Quit date: 03/30/2011  . Smokeless tobacco: Never Used  . Alcohol use No  . Drug use: No  . Sexual activity: Not on file   Other Topics Concern  . Not on file   Social History Narrative  . No narrative on file     Family History  Problem Relation Age of Onset  . Hypertension Mother   . Heart failure Brother   . Breast cancer Mother   . Brain cancer Maternal Uncle   . Brain cancer Maternal Uncle   . Cervical cancer Maternal Aunt   . Stroke Other        great Maternal grandmother  . Stroke Maternal Grandmother   . Alcohol abuse Father   . Sudden death Father      Review of Systems Positive for shortness of breath with activity, weight remains stable, occasional headaches, sneezing, anxiety and leg swelling  Constitutional: negative for anorexia, fevers and sweats  Eyes: negative for irritation, redness and visual disturbance  Ears, nose, mouth, throat, and face: negative for  earaches, epistaxis, nasal congestion and sore throat  Respiratory: negative for cough,  sputum and wheezing  Cardiovascular: negative for chest pain,  orthopnea, palpitations and syncope  Gastrointestinal: negative for abdominal pain, constipation, diarrhea, melena, nausea and vomiting  Genitourinary:negative for dysuria, frequency and hematuria  Hematologic/lymphatic: negative for bleeding, easy bruising and lymphadenopathy  Musculoskeletal:negative for arthralgias, muscle weakness and stiff joints  Neurological: negative for coordination problems, gait problems, headaches and weakness  Endocrine: negative for diabetic symptoms including polydipsia, polyuria and weight loss     Objective:   Physical Exam   Gen. Pleasant, obese, in no distress,anxious affect ENT - on [place], no post nasal drip, class 2  airway Neck: No JVD, no thyromegaly, no carotid bruits Lungs: no use of accessory muscles, no dullness to percussion, decreased without rales or rhonchi  Cardiovascular: Rhythm regular, heart sounds  normal, no murmurs or gallops, no peripheral edema Abdomen: soft and non-tender, no hepatosplenomegaly, BS normal. Musculoskeletal: No deformities, no cyanosis or clubbing Neuro:  alert, non focal, no tremors        Assessment & Plan:

## 2017-06-06 NOTE — Assessment & Plan Note (Signed)
Although she does have obesity hypoventilation, CPAP should work just as effectively if she tolerates this  If she fails, we can consider BiPAP therapy

## 2017-06-06 NOTE — Assessment & Plan Note (Signed)
Get back on  CPAP machine. Prescription will be sent for nasal pillows another CPAP supplies. We will check download report in the next visit  Weight loss encouraged, compliance with goal of at least 4-6 hrs every night is the expectation. Advised against medications with sedative side effects Cautioned against driving when sleepy - understanding that sleepiness will vary on a day to day basis

## 2017-06-06 NOTE — Assessment & Plan Note (Signed)
We will ask [facility] to check into lighter portable oxygen concentrator.   Flu shot today

## 2017-06-06 NOTE — Assessment & Plan Note (Signed)
Trial of BREO once daily, check with her insurance if this is covered or alternative medication that is covered  Consider pulmonary rehabilitation program in the future, they live in [place]  and she did not like the rehabilitation program in [place]

## 2017-06-06 NOTE — Telephone Encounter (Signed)
[employer] RX BIN: [id number] RXGrp: [id number] RXPCN: [id number] Mem ID: [id number]

## 2017-06-06 NOTE — Patient Instructions (Addendum)
We will ask [facility] to check into lighter portable oxygen concentrator.  Get back on  CPAP machine. Prescription will be sent for nasal pillows another CPAP supplies. We will check download report in the next visit  Trial of BREO once daily, check with her insurance if this is covered or alternative medication that is covered  Flu shot today

## 2017-07-05 ENCOUNTER — Other Ambulatory Visit (HOSPITAL_COMMUNITY): Payer: Self-pay | Admitting: *Deleted

## 2017-07-06 ENCOUNTER — Encounter (HOSPITAL_COMMUNITY): Payer: Medicare Other | Admitting: Internal Medicine

## 2017-07-12 ENCOUNTER — Telehealth (HOSPITAL_COMMUNITY): Payer: Self-pay | Admitting: Internal Medicine

## 2017-07-12 NOTE — Telephone Encounter (Signed)
Pt had to cancel appt with Dr. Haroldine Laws on 07/06/17.  Calledpatient  to get her an appt scheduled for 09/14/17 per Kevan Rosebush, RN but no answer or answering machine.  Will attempt to reach patient later.

## 2017-07-19 ENCOUNTER — Telehealth (HOSPITAL_COMMUNITY): Payer: Self-pay | Admitting: Pharmacist

## 2017-07-19 NOTE — Telephone Encounter (Signed)
Submitted renewal PA for Opsumit through [employer].   ID: [id number] BIN: [id number] PCN: [id number] GRP: [id number]  Jade Burkard K. Velva Harman, PharmD, BCPS, CPP Clinical Pharmacist Pager: [phone] Phone: [phone] 07/19/2017 12:10 PM

## 2017-07-19 NOTE — Telephone Encounter (Signed)
Opsumit PA approved by [employer] through 07/18/20.   Ruta Hinds. Velva Harman, PharmD, BCPS, CPP Clinical Pharmacist Pager: [phone] Phone: [phone] 07/19/2017 3:47 PM

## 2017-07-21 ENCOUNTER — Telehealth (HOSPITAL_COMMUNITY): Payer: Self-pay | Admitting: Pharmacist

## 2017-07-21 ENCOUNTER — Encounter (HOSPITAL_COMMUNITY): Payer: Self-pay | Admitting: Internal Medicine

## 2017-07-21 NOTE — Telephone Encounter (Signed)
Patient has not called back to reschedule her appt.  I have tried several times to reach patient but phone rings and then just cuts off without any answer.  I will mail patient a letter asking her to contact our office to schedule her appt.

## 2017-07-21 NOTE — Telephone Encounter (Signed)
[name] assistance approved by [employer] through 09/05/2017.   Alexis [name]. Alexis [name], PharmD, BCPS, CPP Clinical Pharmacist Pager: [phone] Phone: [phone] 07/21/2017 12:22 PM

## 2017-08-01 ENCOUNTER — Telehealth (HOSPITAL_COMMUNITY): Payer: Self-pay | Admitting: Pharmacist

## 2017-08-01 NOTE — Telephone Encounter (Signed)
Tadalafil PA approved by [employer] through 07/31/2020.   Ruta Hinds. Velva Harman, PharmD, BCPS, CPP Clinical Pharmacist Pager: [phone] Phone: [phone] 08/01/2017 12:28 PM

## 2017-08-02 ENCOUNTER — Other Ambulatory Visit (HOSPITAL_COMMUNITY): Payer: Self-pay | Admitting: Pharmacist

## 2017-08-02 MED ORDER — TADALAFIL (PAH) 20 MG PO TABS: 40.0000 mg | ORAL_TABLET | Freq: Every day | ORAL | 11 refills | Status: DC

## 2017-08-04 ENCOUNTER — Telehealth: Payer: Self-pay | Admitting: Pulmonary Disease

## 2017-08-04 ENCOUNTER — Telehealth (HOSPITAL_COMMUNITY): Payer: Self-pay | Admitting: *Deleted

## 2017-08-04 DIAGNOSIS — G4733 Obstructive sleep apnea (adult) (pediatric): Secondary | ICD-10-CM

## 2017-08-04 NOTE — Telephone Encounter (Signed)
OK for advair 250 - 1 puff bid OK to switch DME

## 2017-08-04 NOTE — Telephone Encounter (Signed)
Spoke with pt, advised her that RA approved Advair. She did not want me to cal it in yet because she wants to check to see how much it is from her insurance first. She also agreed to change DME company for her CPAP.   Pcc's before I place the order do you think she can switch her DME company? Would we have to order a new machine or can we just order supplies and POC through someone else? Please advise.

## 2017-08-04 NOTE — Telephone Encounter (Signed)
Spoke with pt, she would like to change her inhaler from [facility] to Advair to get a cheaper alternative. She states she never received a call from [facility] but when she did they stated they could not give her any medical equipment due to owing a balance. She states she is unable to pay. She is still using her oxygen at night and unable to use her CPAP because she is not able to get a new mask. RA please advise. Can we switch DME company?

## 2017-08-04 NOTE — Telephone Encounter (Signed)
Advanced Heart Failure Triage Encounter  Patient Name: Alexis [name]  Date of Call: 08/04/17  Problem: Medication assistance  Patient left message on traige line stating that due to changes in her insurance coverage she will no longer be able to afford Adcirca starting in January 2019.  She is asking what she needs to do.   Plan:  Will send to Ileene Patrick, PharmD to review.   Darron Doom, RN

## 2017-08-04 NOTE — Telephone Encounter (Signed)
Pt is aware of below message and voiced her understanding. Order has been placed to switch DME companies. Pt states she currently has [employer] and will keep that for now.  Nothing further is needed.

## 2017-08-04 NOTE — Telephone Encounter (Signed)
Talked to [facility] he said we can try looks like her balance is from not paying her copays but I can try another one

## 2017-08-05 NOTE — Telephone Encounter (Signed)
Have already received an approval for tadalafil by [employer] through 07/31/20 but will follow up the first of the year to make sure that it will still be covered. Relayed info to Mr. Baksh who was grateful for the assistance.   Ruta Hinds. Velva Harman, PharmD, BCPS, CPP Clinical Pharmacist Pager: [phone] Phone: [phone] 08/05/2017 10:51 AM

## 2017-08-08 ENCOUNTER — Ambulatory Visit: Payer: Medicare Other | Admitting: Adult Health

## 2017-08-22 ENCOUNTER — Telehealth: Payer: Self-pay | Admitting: Pulmonary Disease

## 2017-08-22 NOTE — Telephone Encounter (Signed)
Order placed on 11/29 to switch DME companies.    pcc's please advise. Thanks!

## 2017-08-23 NOTE — Telephone Encounter (Signed)
lmtcb x1 for pt. 

## 2017-08-23 NOTE — Telephone Encounter (Signed)
Pt's new dme will be [facility] in [place] pt is aware and was given phone# to call them. She would like the nyrse to call her back about some other questions to do with her 02 usage Alexis [name]

## 2017-08-24 NOTE — Telephone Encounter (Signed)
lmtcb x2 for pt.

## 2017-08-25 NOTE — Telephone Encounter (Signed)
Spoke with pt, she has several concerns about her O2 use.  I've scheduled pt for next available appt to further discuss these concerns.  Nothing further needed.

## 2017-09-14 ENCOUNTER — Ambulatory Visit: Payer: Medicare Other | Admitting: Acute Care

## 2017-09-23 ENCOUNTER — Encounter (HOSPITAL_COMMUNITY): Payer: Medicare Other | Admitting: Internal Medicine

## 2017-09-26 ENCOUNTER — Telehealth (HOSPITAL_COMMUNITY): Payer: Self-pay | Admitting: Pharmacist

## 2017-09-26 NOTE — Telephone Encounter (Signed)
[employer] patient assistance approved for Opsumit through 09/05/18.   Ruta Hinds. Velva Harman, PharmD, BCPS, CPP Clinical Pharmacist Phone: [phone] 09/26/2017 12:03 PM

## 2017-09-30 ENCOUNTER — Ambulatory Visit: Payer: Medicare Other | Admitting: Adult Health

## 2017-10-21 ENCOUNTER — Inpatient Hospital Stay (HOSPITAL_COMMUNITY): Admission: RE | Admit: 2017-10-21 | Payer: Medicare Other | Source: Ambulatory Visit | Admitting: Internal Medicine

## 2017-10-31 ENCOUNTER — Ambulatory Visit: Payer: Medicare Other | Admitting: Adult Health

## 2017-11-08 ENCOUNTER — Other Ambulatory Visit (HOSPITAL_COMMUNITY): Payer: Self-pay | Admitting: *Deleted

## 2017-11-08 MED ORDER — MACITENTAN 10 MG PO TABS: 10.0000 mg | ORAL_TABLET | Freq: Every day | ORAL | 11 refills | Status: DC

## 2017-11-09 IMAGING — CT CT NECK W/ CM
3 of 8 series · 8 of 33 positions shown, 9 images · IV contrast (iopamidol)
Comparison: Ultrasound guided biopsy images [id number]. Neck CT
08/02/2015.

CLINICAL DATA: 59-year-old female with chronic left parotid mass,
biopsied with ultrasound guidance in July 2015 with pathology
reported as Warthin tumor. Subsequent encounter.

EXAM:
CT NECK WITH CONTRAST
TECHNIQUE: Multidetector CT imaging of the neck was performed using the
standard protocol following the bolus administration of intravenous
contrast.
CONTRAST:  75mL [id number] IOPAMIDOL ([id number]) INJECTION 61%

[Series 4: axial neck · axial · 0.45mm/px · z∈[+54,+154]mm · 3 of 102 slices shown]
[im 26/102  bone]
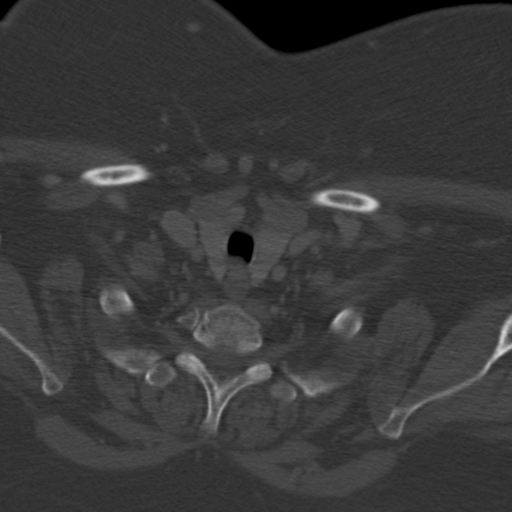
[im 51/102  bone]
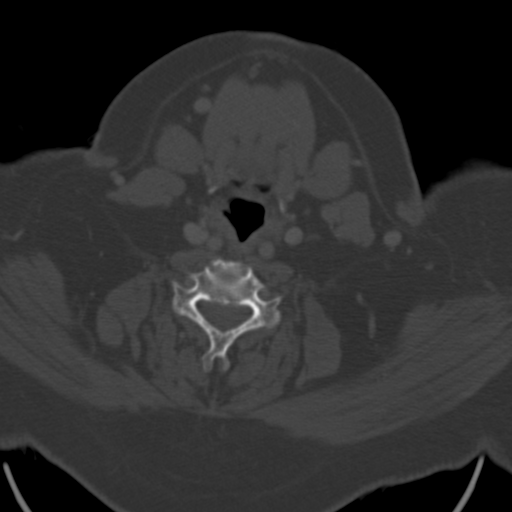
[im 76/102  bone]
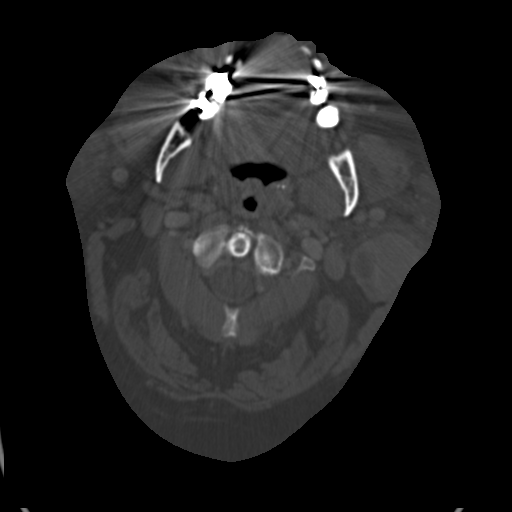

[Series 6: cor neck · coronal · 0.41mm/px · 1 of 116 slices shown]
[im 58/116  bone]
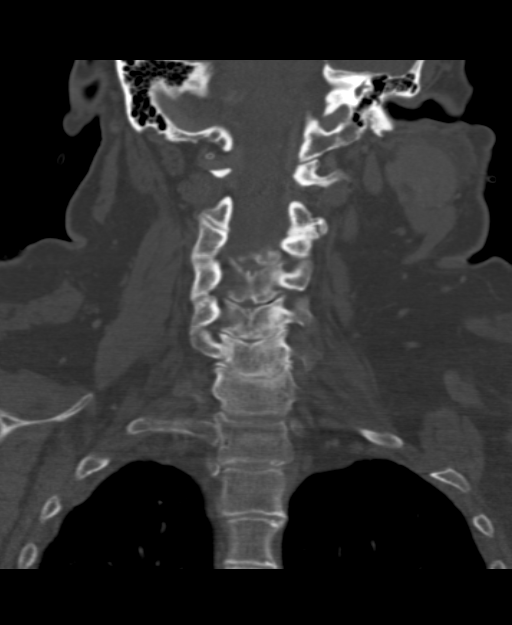

[Series 7: ax oropharynx · axial · 0.43mm/px · z∈[+15,+156]mm · 4 of 121 slices shown, 5 images]
[im 25/121  soft-tissue]
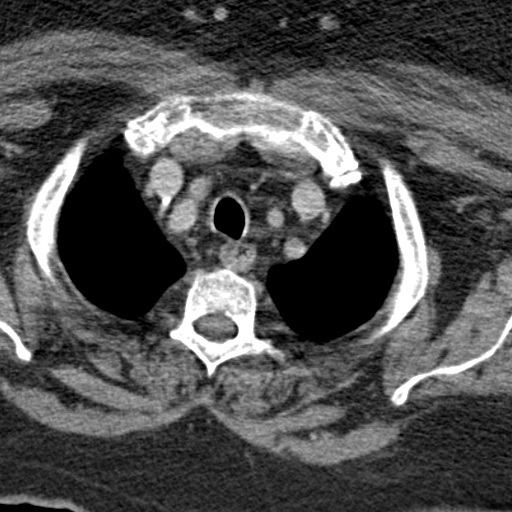
[im 25/121  bone]
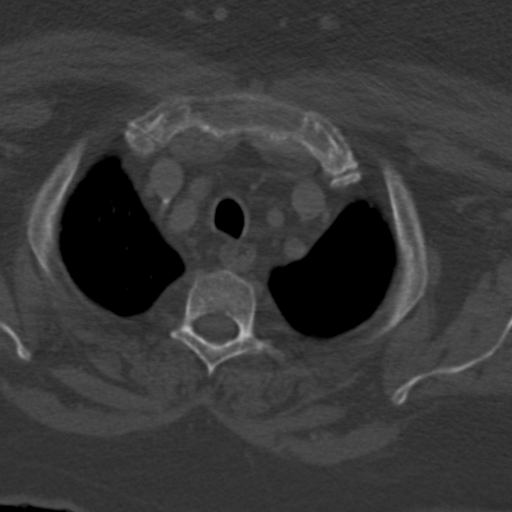
[im 49/121  bone]
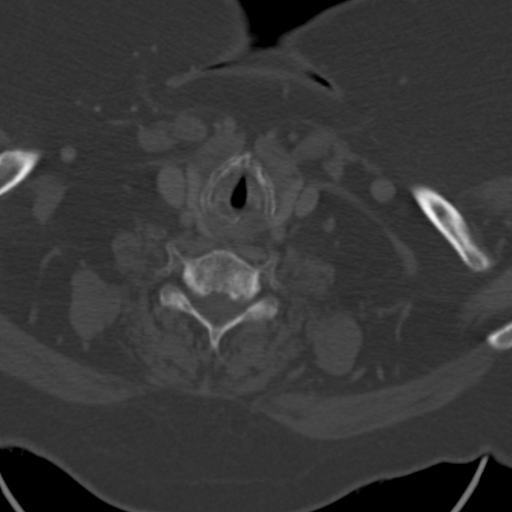
[im 73/121  bone]
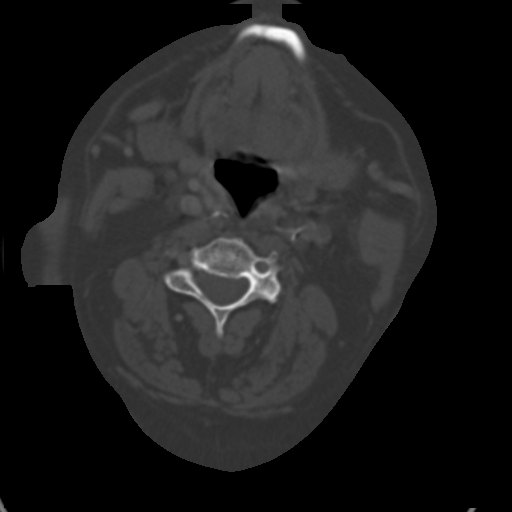
[im 97/121  bone]
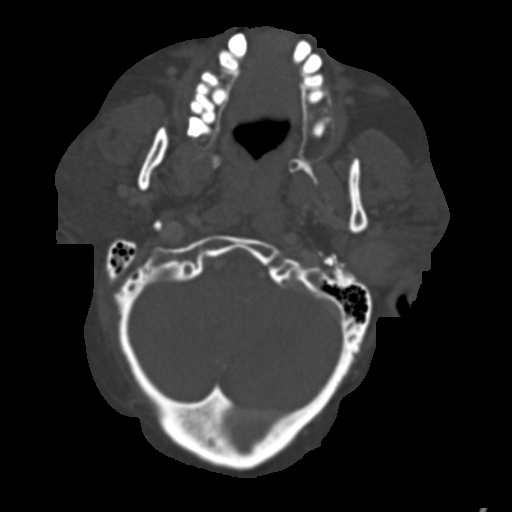

[8 of 33 positions shown; findings below may reference images not displayed]

FINDINGS: Large body habitus.

Pharynx and larynx: Mild motion artifact at the larynx today where
the glottis appears to be closed. No laryngeal mass or abnormality.
Pharyngeal soft tissue contours are within normal limits. Negative
parapharyngeal spaces. The retropharyngeal space is remarkable for a
retropharyngeal course of both carotid arteries.

Salivary glands: Negative sublingual space, submandibular glands,
and right parotid gland.

Chronic round and oval left parotid space mass mostly occupying the
deep lobe. Today this encompasses 35 x 33 by 42 mm versus 27 by 34 x
40 mm in July 2015 (AP by transverse by CC). The enhancement
pattern of the lesion is stable, with mostly solid enhancement aside
from a a hypodense nodule along the posterior inferior margin. As
before, no regional lymphadenopathy. The remainder of the left
parotid gland appears normal. Normal CT appearance of the left
stylomastoid foramen.

Thyroid: Negative.

Lymph nodes: No cervical or parotid space lymphadenopathy.

Vascular: Calcified atherosclerosis of the carotid bifurcations
which are located in the retropharyngeal space. Additional calcified
atherosclerosis at the skullbase. Major vascular structures in the
neck and at the skullbase are patent.

Limited intracranial: Negative.

Visualized orbits: Negative.

Mastoids and visualized paranasal sinuses: Stable and well
pneumatized visualized paranasal sinuses and mastoids.

Skeleton: Stable appearance of cervical spine degenerative osseous
changes. No acute osseous abnormality identified.

Upper chest: Stable and negative visualized superior mediastinum.
Lung windows do demonstrate chronic mosaic attenuation in the right
greater than left upper lobes.
IMPRESSION: 1. Stable to minimally increased 4 cm left parotid mass diagnosed as
Warthin tumor by ultrasound-guided needle biopsy in 8992.
Enhancement characteristics are stable. No associated
lymphadenopathy or involvement of the left stylomastoid foramen.
2. Stable neck CT otherwise. Retropharyngeal carotid arteries with
calcified atherosclerosis.
3. Chronic upper lobe mosaic attenuation pattern which can be seen
in the setting of chronic pulmonary small airway versus small vessel
disease.

## 2017-12-14 ENCOUNTER — Telehealth (HOSPITAL_COMMUNITY): Payer: Self-pay

## 2017-12-14 NOTE — Telephone Encounter (Signed)
Received call from [place] stating patient has been unreachable by phone for 2 weeks in order to refill Opsumit. This RN called patient to follow up. Spoke with family member who states she is in bed and unable to talk on the phone. Advised strongly to call pharmacy immediately to ensure she received Opsumit. Patient's family member states "I will give this message and phone number to her when she gets up". Also reminded of next week's appt in CHF clinic.  Renee Pain, RN

## 2017-12-21 ENCOUNTER — Ambulatory Visit (HOSPITAL_BASED_OUTPATIENT_CLINIC_OR_DEPARTMENT_OTHER)
Admission: RE | Admit: 2017-12-21 | Discharge: 2017-12-21 | Disposition: A | Discharge status: Home / Self Care | Payer: Medicare Other | Source: Ambulatory Visit | Attending: Internal Medicine | Admitting: Internal Medicine

## 2017-12-21 ENCOUNTER — Encounter (HOSPITAL_COMMUNITY): Payer: Self-pay | Admitting: Internal Medicine

## 2017-12-21 ENCOUNTER — Other Ambulatory Visit: Payer: Self-pay

## 2017-12-21 ENCOUNTER — Encounter: Payer: Self-pay | Admitting: *Deleted

## 2017-12-21 VITALS — BP 146/86 | HR 93 | Wt 232.5 lb

## 2017-12-21 DIAGNOSIS — E785 Hyperlipidemia, unspecified: Secondary | ICD-10-CM | POA: Insufficient documentation

## 2017-12-21 DIAGNOSIS — I5032 Chronic diastolic (congestive) heart failure: Secondary | ICD-10-CM

## 2017-12-21 DIAGNOSIS — I5082 Biventricular heart failure: Secondary | ICD-10-CM | POA: Diagnosis not present

## 2017-12-21 DIAGNOSIS — Z79899 Other long term (current) drug therapy: Secondary | ICD-10-CM | POA: Insufficient documentation

## 2017-12-21 DIAGNOSIS — Z006 Encounter for examination for normal comparison and control in clinical research program: Secondary | ICD-10-CM

## 2017-12-21 DIAGNOSIS — I2721 Secondary pulmonary arterial hypertension: Secondary | ICD-10-CM

## 2017-12-21 DIAGNOSIS — J449 Chronic obstructive pulmonary disease, unspecified: Secondary | ICD-10-CM | POA: Insufficient documentation

## 2017-12-21 DIAGNOSIS — Z880 Allergy status to penicillin: Secondary | ICD-10-CM

## 2017-12-21 DIAGNOSIS — Z885 Allergy status to narcotic agent status: Secondary | ICD-10-CM

## 2017-12-21 DIAGNOSIS — R000 Tachycardia, unspecified: Secondary | ICD-10-CM | POA: Diagnosis not present

## 2017-12-21 DIAGNOSIS — D119 Benign neoplasm of major salivary gland, unspecified: Secondary | ICD-10-CM

## 2017-12-21 DIAGNOSIS — Z7982 Long term (current) use of aspirin: Secondary | ICD-10-CM | POA: Insufficient documentation

## 2017-12-21 DIAGNOSIS — I5033 Acute on chronic diastolic (congestive) heart failure: Secondary | ICD-10-CM | POA: Diagnosis not present

## 2017-12-21 DIAGNOSIS — R0989 Other specified symptoms and signs involving the circulatory and respiratory systems: Secondary | ICD-10-CM | POA: Diagnosis not present

## 2017-12-21 DIAGNOSIS — G4733 Obstructive sleep apnea (adult) (pediatric): Secondary | ICD-10-CM

## 2017-12-21 DIAGNOSIS — N179 Acute kidney failure, unspecified: Secondary | ICD-10-CM | POA: Diagnosis not present

## 2017-12-21 DIAGNOSIS — Z7951 Long term (current) use of inhaled steroids: Secondary | ICD-10-CM

## 2017-12-21 DIAGNOSIS — Z882 Allergy status to sulfonamides status: Secondary | ICD-10-CM

## 2017-12-21 DIAGNOSIS — Z6841 Body Mass Index (BMI) 40.0 and over, adult: Secondary | ICD-10-CM

## 2017-12-21 DIAGNOSIS — Z9981 Dependence on supplemental oxygen: Secondary | ICD-10-CM | POA: Insufficient documentation

## 2017-12-21 DIAGNOSIS — R600 Localized edema: Secondary | ICD-10-CM | POA: Diagnosis not present

## 2017-12-21 DIAGNOSIS — J9611 Chronic respiratory failure with hypoxia: Secondary | ICD-10-CM | POA: Diagnosis not present

## 2017-12-21 DIAGNOSIS — R609 Edema, unspecified: Secondary | ICD-10-CM | POA: Diagnosis not present

## 2017-12-21 LAB — BASIC METABOLIC PANEL
Anion gap: 10 (ref 5–15)
BUN: 12 mg/dL (ref 6–20)
CHLORIDE: 101 mmol/L (ref 101–111)
CO2: 28 mmol/L (ref 22–32)
CREATININE: 0.85 mg/dL (ref 0.44–1.00)
Calcium: 9.2 mg/dL (ref 8.9–10.3)
GFR calc non Af Amer: >60 mL/min (ref >60)
Glucose, Bld: 112 mg/dL — ABNORMAL HIGH (ref 65–99)
POTASSIUM: 3.8 mmol/L (ref 3.5–5.1)
Sodium: 139 mmol/L (ref 135–145)

## 2017-12-21 MED ORDER — TORSEMIDE 20 MG PO TABS: 40.0000 mg | ORAL_TABLET | Freq: Two times a day (BID) | ORAL | 6 refills | Status: AC

## 2017-12-21 NOTE — Patient Instructions (Addendum)
Increase Torsemide to 40 mg (2 tabs) Twice daily   Take Metolazone to 2.5 mg (1/2 tab) FOR 2 DAYS  Make sure to take extra Potassium when you take Metolazone  Labs today  Your physician recommends that you schedule a follow-up appointment in: 1 week

## 2017-12-21 NOTE — Progress Notes (Signed)
Patient ID: Alexis [name], female   DOB: 1956-08-07, 62 y.o.   MRN: [id number]  ADVANCED HF CLINIC NOTE  Patient ID: Alexis [name], female   DOB: Apr 22, 1956, 62 y.o.   MRN: [id number] PCP: Dr Frann Rider [place] [phone]  HPI Alexis [name] is a 62 year old female with history of obesity, severe COPD, mixed PAH and diastolic dysfunction.   Initial presentation was to [facility] in 2012 with acute on chronic respiratory failure. Based on the CT scan it was felt that the patient had severe pulmonary hypertension and was referred for diagnostic cardiac catheterization. Coronaries were normal. Her PA pressures and mean pulmonary artery pressure were markedly elevated. She was found to have severe obstructive/restrictive lung disease but with very little response to bronchodilator therapy. VQ and CT negative for PE. She was then subsequently referred to Dr. Koleen Nimrod. Her PH was felt to be out of proportion to her COPD and she was started on PDE-5 inhibitor and macitentan was added, She has been intolerant of Ventavis due to cough.   Admitted from clinic 07/29/15 with marked volume overload and SOB with minimal activity and O2 desaturations into the 70-80s in clinic despite home O2.  Echo 07/30/15 with severe Pulmonary hypertension (PA peak pressure 115 mm Hg) and RV severely dilated.Improved with aggressive diuresis and [facility] 07/2815 showed adequate diuresis, normal coronaries, and significant improved of PA pressures from previous cath (full report below).  Left parotid mass noted on CT with c/o of left neck fullness. 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 Dr Benjamine Mola consulted for outpatient follow up. Diuresed well on IV lasix. Discharge weight 230 lbs.  Here for routine HF f/u. Has been on Adcirca and macitentan. Says overall doing ok but for past 3 weeks has had lower extremity edema which has not gone away.2 weeks ago took metolazone and it helped some but did not go  away completely. Now fluid getting worse again. Taking torsemide 40 daily and spiro 25 daily. Has chronic severe DOE which is unchanged. Sleeps on her side. Not wearing CPAP. No CP. Has been using O2. Drinking a lot of water and tea.    RHC 09/15/16  RA = 13 RV = 106/20 PA = 108/52 (65) PCW = 12 Fick cardiac output/index = 5.7/2.8 PVR = 11.0 WU Ao sat = 93% PA sat = 71%, 72% SVC sat = 65% RA sat = 75  PFTs 10/12/16  FEV1 0.67L (29%) FVC  0.81 (32%) DLCO 52%  July 2012 pulmonary function test FVC 1.17 L per minute 37% of predicted, FEV1 1 L 40% of predicted. DLCO 61%  ECHO 06/2011 EF 60-65%. RV moderately dilated and mild to moderately hypokinetic with septal flattening RVSP 77 ECHO 10/02/12 Poor quality. EF 60-65% RV does appear better.  ECHO 1/15 EF 65% RV mildly dilated/HK D-shaped septum RVSP 72 ECHO 11/16 EF 60% RVSP 115 (but 70 on follow-up cath)  Echo 12/17 EF 60% RV markedly dilated but function relatively preserved   07/12/11 6MW at pulmonary rehab unchanged from August 2012. 900 feet. 04/26/12  6MW 870 feet O2 sats 93-94% on 2 liters [place]. 10/02/12 6MW 830 ft on 2L O2 with sats 87-90% 04/16/13            6MW 760 ft on 2L O2 sats 89-93% 01/21/14            6MW 600 ft on 2L O2 sats 88-91%  Unable to do further 6MW  10/06/12 [place] on adcirca and macitentan  RA = 15  RV = 91/15/20  PA = 90/41 (64)  PCW = 20  Fick cardiac output/index = 5.2/2.5  PVR = 8.4 [place]  FA sat = 95%  PA sat = 69%, 73%  High SVC sat = 71%  08/04/15 RHC on adcirca and macitentan RA = 14 RV = 70/11/16 PA = 73/31 (52) PCW = 15 Ao = 91/55 (68) LV = 99/6/20 Fick cardiac output/index = 5.5/2.7 PVR = 6.8 WU SVR = 791 dynes FA sat = 96% PA sat = 66%, 67%  08/04/15 LHC  Normal coronaries with separate ostia for LAD and LCX  Initial RHC April 02, 2011.  RA mean of 25 RV pressure is 117/13 PA 112/54 with mean of 74.  CO 5.8 liters. CI 3.3 liters.   PVR approximately 13 [place] units. Started on  sildenafil (switched to tadalafil)   Allergies  Allergen Reactions  . Bactrim     Face and eye swelling  . Cinnamon     wheezing  . Morphine And Related Other (See Comments)    Severe headache  . Penicillins     "doesn't work" got a lot as a child that doesn't work. But no systemic reaction.  . Sulfa Antibiotics Itching  . Valdecoxib     Face swelling   Current Outpatient Medications on File Prior to Encounter  Medication Sig Dispense Refill  . acetaminophen (TYLENOL) 500 MG tablet Take 1,000 mg by mouth every 6 (six) hours as needed. For pain    . albuterol (PROVENTIL HFA;VENTOLIN HFA) 108 (90 BASE) MCG/ACT inhaler Inhale 2 puffs into the lungs every 4 (four) hours as needed for wheezing or shortness of breath.    Marland Kitchen alum & mag hydroxide-simeth (MAALOX/MYLANTA) 200-200-20 MG/5ML suspension Take 30 mLs by mouth every 6 (six) hours as needed for indigestion or heartburn.    Marland Kitchen aspirin 81 MG EC tablet Take 81 mg by mouth daily.      . Aspirin-Acetaminophen-Caffeine (GOODY HEADACHE PO) Take 1 Package by mouth daily as needed (Severe Headaches).     . Calcium Carbonate Antacid (ALKA-SELTZER ANTACID PO) Take 1 tablet by mouth daily as needed (heartburn).    . diphenhydrAMINE (BENADRYL) 25 MG tablet Take 25 mg by mouth every 6 (six) hours as needed.    . fluticasone furoate-vilanterol (BREO ELLIPTA) 100-25 MCG/INH AEPB Inhale 1 puff into the lungs daily. 2 each 0  . linaclotide (LINZESS) 72 MCG capsule Take 72 mcg by mouth daily before breakfast.     . Macitentan (OPSUMIT) 10 MG TABS Take 1 tablet (10 mg total) by mouth daily. 30 tablet 11  . metolazone (ZAROXOLYN) 5 MG tablet Take 1 tablet (5 mg total) by mouth as needed. For weights 235 or greater with an extra 20 meq of 15 tablet 3  . potassium chloride SA (K-DUR,KLOR-CON) 20 MEQ tablet Take 3 tablets (60 mEq total) by mouth daily. Take extra 34mq if you take extra torsemide. Take 80 mg twice daily on metolazone days 90 tablet 3  .  spironolactone (ALDACTONE) 25 MG tablet Take 1 tablet (25 mg total) by mouth daily. 90 tablet 3  . torsemide (DEMADEX) 20 MG tablet Take 40 mg by mouth daily. May take an additional 40 mg in pm as needed for swelling / weight gain     No current facility-administered medications on file prior to encounter.     Past Medical History:  Diagnosis Date  . Body mass index 40.0-44.9, adult ([place]   . Cardiomegaly  Normal LV systolic function ejection fraction 55-60% with moderate decrease in RV function.  . Chronic obstructive asthma with exacerbation (HCC)    CT scan was a pattern but no evidence of pulmonary embolus and., Negative VQ scan July 2012 pulmonary function test FVC 1.17 L per minute 37% of predicted, FEV1 1 L 40% of predicted. DLCO 61% of predicted no response to bronchodilators negative HIV serology, ANA in scleroderma.  . Edema   . Hepatomegaly   . Obesity, unspecified   . Obstructive sleep apnea    BiPAP initiated July 2012  . Other and unspecified hyperlipidemia   . Other chronic pulmonary heart diseases   . Other diseases of lung, not elsewhere classified   . Pulmonary arterial hypertension (HCC)     PA pressure 112/54 mmHg the mean of 74 mmHg, right atrial pressure mean 25 mmHg. Pulmonary vascular resistance 13 with units. Cardiac output 5.8 L. Normal coronary arteries with normal LV function July 2012   . SOB (shortness of breath) on exertion   . Tobacco use disorder    PHYSICAL EXAM Vitals:   12/21/17 1454  BP: (!) 146/86  Pulse: 93  SpO2: 97%  Weight: 232 lb 8 oz (105.5 kg)    General:  Obese woman sitting on exam table wearing O2 HEENT: normal Neck: supple. JVP hard to see appears eelvated . Carotids 2+ bilat; no bruits. No lymphadenopathy or thryomegaly appreciated. Cor: PMI nondisplaced. Regular rate & rhythm. No rubs, gallops or murmurs. Lungs: clear with markedly decreased BS no wheeze Abdomen: soft, nontender, mildly distended. No hepatosplenomegaly. No  bruits or masses. Good bowel sounds. Extremities: no cyanosis, clubbing, rash, 3+ edema Neuro: alert & orientedx3, cranial nerves grossly intact. moves all 4 extremities w/o difficulty. Affect pleasant     ASSESSMENT AND PLAN  1) Chronic diastolic HF (R>>L HF): - Volume status markedly elevated. Seems to be related to macitentan as the edema gets better when she stops it for a few days.  - Will increase torsemide to 40 bid and give metolazone for several days - If no improvement may need to be admitted for IV diuresis  - See back next week with labs  2) PAH: Mixed.  She has severe COPD with a component of group 3 PAH but also Group 1 PAH with significant improvement in PA pressures and symptoms with vasodilators.   - Remains NYHA IIIIB symptoms on macitentan and Adcirca.  - Recent RHC with severe PAH (PAs > 100) but given severity of lung disease FEV1 0.69L Will not add selexapeg give risk of shunting.  - If unable to control edema well may need to stop macitentan - Continue O2 - Stressed need for CPAP - she is nearing end-stage 3) COPD on home O2: Continuous 2 liters [place].  - PFTs very severe. Follows with Pulmonary 4) OSA-  Again encouraged to use CPAP 5) Morbid obesity - Encouraged weight loss.  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 - Saw Dr Benjamine Mola and [facility]. Tumor felt to be small. Just watchful waiting.    Amorah Sebring,MD 3:06 PM

## 2017-12-22 NOTE — Progress Notes (Signed)
RAD[facility]  Informed Consent           Subject Name:  Alexis [name]. Terance Hart    Subject met inclusion and exclusion criteria.  The informed consent form, study requirements and expectations were reviewed with the subject and questions and concerns were addressed prior to the signing of the consent form.  The subject verbalized understanding of the trial requirements.  The subject agreed to participate in the [facility] trial and signed the informed consent.  The informed consent was obtained prior to performance of any protocol-specific procedures for the subject.  A copy of the signed informed consent was given to the subject and a copy was placed in the subject's medical record.   [place] Chalmers, Research Assistant 12/21/2017 15:21 p.m.

## 2017-12-23 ENCOUNTER — Telehealth (HOSPITAL_COMMUNITY): Payer: Self-pay | Admitting: *Deleted

## 2017-12-23 ENCOUNTER — Inpatient Hospital Stay (HOSPITAL_COMMUNITY)
Admission: EM | Admit: 2017-12-23 | Discharge: 2017-12-25 | DRG: 292 | Disposition: A | Discharge status: Home / Self Care | Payer: Medicare Other | Attending: Internal Medicine | Admitting: Internal Medicine

## 2017-12-23 ENCOUNTER — Other Ambulatory Visit: Payer: Self-pay

## 2017-12-23 ENCOUNTER — Emergency Department (HOSPITAL_COMMUNITY): Payer: Medicare Other

## 2017-12-23 ENCOUNTER — Encounter (HOSPITAL_COMMUNITY): Payer: Self-pay

## 2017-12-23 DIAGNOSIS — D751 Secondary polycythemia: Secondary | ICD-10-CM | POA: Diagnosis present

## 2017-12-23 DIAGNOSIS — E7849 Other hyperlipidemia: Secondary | ICD-10-CM | POA: Diagnosis present

## 2017-12-23 DIAGNOSIS — Z803 Family history of malignant neoplasm of breast: Secondary | ICD-10-CM | POA: Diagnosis not present

## 2017-12-23 DIAGNOSIS — D119 Benign neoplasm of major salivary gland, unspecified: Secondary | ICD-10-CM | POA: Diagnosis present

## 2017-12-23 DIAGNOSIS — Z885 Allergy status to narcotic agent status: Secondary | ICD-10-CM | POA: Diagnosis not present

## 2017-12-23 DIAGNOSIS — J9611 Chronic respiratory failure with hypoxia: Secondary | ICD-10-CM | POA: Diagnosis present

## 2017-12-23 DIAGNOSIS — E876 Hypokalemia: Secondary | ICD-10-CM | POA: Diagnosis present

## 2017-12-23 DIAGNOSIS — Z9981 Dependence on supplemental oxygen: Secondary | ICD-10-CM | POA: Diagnosis not present

## 2017-12-23 DIAGNOSIS — Z713 Dietary counseling and surveillance: Secondary | ICD-10-CM | POA: Diagnosis not present

## 2017-12-23 DIAGNOSIS — Z8249 Family history of ischemic heart disease and other diseases of the circulatory system: Secondary | ICD-10-CM | POA: Diagnosis not present

## 2017-12-23 DIAGNOSIS — I272 Pulmonary hypertension, unspecified: Secondary | ICD-10-CM

## 2017-12-23 DIAGNOSIS — Z91018 Allergy to other foods: Secondary | ICD-10-CM

## 2017-12-23 DIAGNOSIS — Z8049 Family history of malignant neoplasm of other genital organs: Secondary | ICD-10-CM | POA: Diagnosis not present

## 2017-12-23 DIAGNOSIS — I5033 Acute on chronic diastolic (congestive) heart failure: Principal | ICD-10-CM | POA: Diagnosis present

## 2017-12-23 DIAGNOSIS — R609 Edema, unspecified: Secondary | ICD-10-CM

## 2017-12-23 DIAGNOSIS — Z7982 Long term (current) use of aspirin: Secondary | ICD-10-CM

## 2017-12-23 DIAGNOSIS — Z882 Allergy status to sulfonamides status: Secondary | ICD-10-CM

## 2017-12-23 DIAGNOSIS — Z79899 Other long term (current) drug therapy: Secondary | ICD-10-CM

## 2017-12-23 DIAGNOSIS — J439 Emphysema, unspecified: Secondary | ICD-10-CM | POA: Diagnosis present

## 2017-12-23 DIAGNOSIS — I5082 Biventricular heart failure: Secondary | ICD-10-CM | POA: Diagnosis present

## 2017-12-23 DIAGNOSIS — I2721 Secondary pulmonary arterial hypertension: Secondary | ICD-10-CM | POA: Diagnosis not present

## 2017-12-23 DIAGNOSIS — G4733 Obstructive sleep apnea (adult) (pediatric): Secondary | ICD-10-CM | POA: Diagnosis present

## 2017-12-23 DIAGNOSIS — Z880 Allergy status to penicillin: Secondary | ICD-10-CM

## 2017-12-23 DIAGNOSIS — I509 Heart failure, unspecified: Secondary | ICD-10-CM

## 2017-12-23 DIAGNOSIS — Z7901 Long term (current) use of anticoagulants: Secondary | ICD-10-CM

## 2017-12-23 DIAGNOSIS — Z808 Family history of malignant neoplasm of other organs or systems: Secondary | ICD-10-CM | POA: Diagnosis not present

## 2017-12-23 DIAGNOSIS — R600 Localized edema: Secondary | ICD-10-CM | POA: Diagnosis not present

## 2017-12-23 DIAGNOSIS — Z823 Family history of stroke: Secondary | ICD-10-CM

## 2017-12-23 DIAGNOSIS — R000 Tachycardia, unspecified: Secondary | ICD-10-CM | POA: Diagnosis not present

## 2017-12-23 DIAGNOSIS — Z87891 Personal history of nicotine dependence: Secondary | ICD-10-CM

## 2017-12-23 DIAGNOSIS — R011 Cardiac murmur, unspecified: Secondary | ICD-10-CM | POA: Diagnosis present

## 2017-12-23 DIAGNOSIS — R0989 Other specified symptoms and signs involving the circulatory and respiratory systems: Secondary | ICD-10-CM | POA: Diagnosis not present

## 2017-12-23 DIAGNOSIS — N179 Acute kidney failure, unspecified: Secondary | ICD-10-CM | POA: Diagnosis present

## 2017-12-23 DIAGNOSIS — I5032 Chronic diastolic (congestive) heart failure: Secondary | ICD-10-CM | POA: Diagnosis present

## 2017-12-23 DIAGNOSIS — Z811 Family history of alcohol abuse and dependence: Secondary | ICD-10-CM

## 2017-12-23 DIAGNOSIS — Z6841 Body Mass Index (BMI) 40.0 and over, adult: Secondary | ICD-10-CM

## 2017-12-23 DIAGNOSIS — Z888 Allergy status to other drugs, medicaments and biological substances status: Secondary | ICD-10-CM

## 2017-12-23 HISTORY — DX: Snoring: R06.83

## 2017-12-23 LAB — CBC
HEMATOCRIT: 54.7 % — AB (ref 36.0–46.0)
Hemoglobin: 17.5 g/dL — ABNORMAL HIGH (ref 12.0–15.0)
MCH: 31.3 pg (ref 26.0–34.0)
MCHC: 32 g/dL (ref 30.0–36.0)
MCV: 97.9 fL (ref 78.0–100.0)
Platelets: 177 10*3/uL (ref 150–400)
RBC: 5.59 MIL/uL — ABNORMAL HIGH (ref 3.87–5.11)
RDW: 15.2 % (ref 11.5–15.5)
WBC: 7.3 10*3/uL (ref 4.0–10.5)

## 2017-12-23 LAB — BASIC METABOLIC PANEL
Anion gap: 18 — ABNORMAL HIGH (ref 5–15)
BUN: 17 mg/dL (ref 6–20)
CALCIUM: 10.1 mg/dL (ref 8.9–10.3)
CHLORIDE: 87 mmol/L — AB (ref 101–111)
CO2: 32 mmol/L (ref 22–32)
CREATININE: 1.23 mg/dL — AB (ref 0.44–1.00)
GFR calc Af Amer: 53 mL/min — ABNORMAL LOW (ref >60)
GFR calc non Af Amer: 46 mL/min — ABNORMAL LOW (ref >60)
Glucose, Bld: 92 mg/dL (ref 65–99)
Potassium: 3.3 mmol/L — ABNORMAL LOW (ref 3.5–5.1)
SODIUM: 137 mmol/L (ref 135–145)

## 2017-12-23 LAB — BRAIN NATRIURETIC PEPTIDE: B Natriuretic Peptide: 563.1 pg/mL — ABNORMAL HIGH (ref 0.0–100.0)

## 2017-12-23 MED ORDER — FUROSEMIDE 10 MG/ML IJ SOLN
80.0000 mg | Freq: Once | INTRAMUSCULAR | Status: AC
  Administered 2017-12-24: 80 mg via INTRAVENOUS
  Filled 2017-12-23: qty 8

## 2017-12-23 MED ORDER — POTASSIUM CHLORIDE CRYS ER 20 MEQ PO TBCR
40.0000 meq | EXTENDED_RELEASE_TABLET | Freq: Once | ORAL | Status: AC
  Administered 2017-12-24: 40 meq via ORAL
  Filled 2017-12-23: qty 2

## 2017-12-23 MED ORDER — POTASSIUM CHLORIDE CRYS ER 20 MEQ PO TBCR: 20.0000 meq | EXTENDED_RELEASE_TABLET | ORAL | Status: DC | PRN

## 2017-12-23 NOTE — ED Notes (Addendum)
Staff brought the patient back to the room and asked her to get undressed, the patient refused and said that she was supposed to be going up to the floor to be admitted per her doctor. The patient got very upset with staff because she was not admitted, and didn't want to see a doctor. Staff tried to [place] to the pt that registration had no notification of her being a direct admit and that the time she waited in the waiting room was to be seen and evaluated by one of our providers. Pt is currently calling her doctor's office. RN notified

## 2017-12-23 NOTE — ED Provider Notes (Signed)
[facility]PART[facility] Provider Note   CSN: [id number] Arrival date & time: 12/23/17  1635  Time seen 23:18 PM   History   Chief Complaint Chief Complaint  Patient presents with  . Leg Swelling    HPI Alexis [name] is a 62 y.o. female.  HPI patient has a history of pulmonary arterial hypertension, asthma, on home oxygen at 2 L/min nasal cannula reports increased swelling over the past 3 weeks.  She reports swelling of her lower extremities and also her abdomen.  She denies chest pain or shortness of breath.  She states she was seen a couple days ago and was given additional diuretics without improvement.  She states she was advised to come to the ED to "be directly admitted".  When I enter the room she states she is already been seen by somebody in the cardiology department for admission.  PCP Joseph Art, MD   Past Medical History:  Diagnosis Date  . Body mass index 40.0-44.9, adult ([place])   . Cardiomegaly    Normal LV systolic function ejection fraction 55-60% with moderate decrease in RV function.  . Chronic obstructive asthma with exacerbation (HCC)    CT scan was a pattern but no evidence of pulmonary embolus and., Negative VQ scan July 2012 pulmonary function test FVC 1.17 L per minute 37% of predicted, FEV1 1 L 40% of predicted. DLCO 61% of predicted no response to bronchodilators negative HIV serology, ANA in scleroderma.  . Edema   . Hepatomegaly   . Obesity, unspecified   . Obstructive sleep apnea    BiPAP initiated July 2012  . Other and unspecified hyperlipidemia   . Other chronic pulmonary heart diseases   . Other diseases of lung, not elsewhere classified   . Pulmonary arterial hypertension (HCC)     PA pressure 112/54 mmHg the mean of 74 mmHg, right atrial pressure mean 25 mmHg. Pulmonary vascular resistance 13 with units. Cardiac output 5.8 L. Normal coronary arteries with normal LV function July 2012   . SOB (shortness of  breath) on exertion   . Tobacco use disorder     Patient Active Problem List   Diagnosis Date Noted  . PAH (pulmonary artery hypertension) ([place])   . Parotid tumor   . Acute on chronic diastolic (congestive) heart failure ([place]) 07/29/2015  . Chronic respiratory failure ([place]) 03/12/2014  . Obesity hypoventilation syndrome ([place]) 10/09/2013  . Morbid obesity ([place]) 04/16/2013  . Abdominal pain 12/07/2012  . Chronic diastolic heart failure ([place]) 06/17/2011  . Chronic anticoagulation 04/28/2011  . Sinusitis 04/28/2011  . Pulmonary arterial hypertension ([place])   . Obstructive sleep apnea   . Cardiomegaly   . COPD (chronic obstructive pulmonary disease) with emphysema ([place])   . SOB (shortness of breath) on exertion     Past Surgical History:  Procedure Laterality Date  . CARDIAC CATHETERIZATION  04/02/2011  . CARDIAC CATHETERIZATION N/A 08/04/2015   Procedure: Right/Left Heart Cath and Coronary Angiography;  Surgeon: Jolaine Artist, MD;  Location: [facility];  Service: Cardiovascular;  Laterality: N/A;  . CARDIAC CATHETERIZATION N/A 09/15/2016   Procedure: Right Heart Cath;  Surgeon: Jolaine Artist, MD;  Location: [facility];  Service: Cardiovascular;  Laterality: N/A;  . CESAREAN SECTION    . DILATION AND CURETTAGE OF UTERUS    . RIGHT HEART CATHETERIZATION N/A 10/06/2012   Procedure: RIGHT HEART CATH;  Surgeon: Jolaine Artist, MD;  Location: [facility] CATH LAB;  Service: Cardiovascular;  Laterality: N/A;  . TUBAL LIGATION       OB History   None      Home Medications    Prior to Admission medications   Medication Sig Start Date End Date Taking? Authorizing Provider  acetaminophen (TYLENOL) 500 MG tablet Take 1,000 mg by mouth every 6 (six) hours as needed. For pain    [provider]  albuterol (PROVENTIL HFA;VENTOLIN HFA) 108 (90 BASE) MCG/ACT inhaler Inhale 2 puffs into the lungs every 4 (four) hours as needed for wheezing or shortness of breath.     [provider]  alum & mag hydroxide-simeth (MAALOX/MYLANTA) 200-200-20 MG/5ML suspension Take 30 mLs by mouth every 6 (six) hours as needed for indigestion or heartburn.    [provider]  aspirin 81 MG EC tablet Take 81 mg by mouth daily.      [provider]  Aspirin-Acetaminophen-Caffeine (GOODY HEADACHE PO) Take 1 Package by mouth daily as needed (Severe Headaches).     [provider]  Calcium Carbonate Antacid (ALKA-SELTZER ANTACID PO) Take 1 tablet by mouth daily as needed (heartburn).    [provider]  diphenhydrAMINE (BENADRYL) 25 MG tablet Take 25 mg by mouth every 6 (six) hours as needed.    [provider]  fluticasone furoate-vilanterol (BREO ELLIPTA) 100-25 MCG/INH AEPB Inhale 1 puff into the lungs daily. 06/06/17   Rigoberto Noel, MD  linaclotide (LINZESS) 72 MCG capsule Take 72 mcg by mouth daily before breakfast.     [provider]  Macitentan (OPSUMIT) 10 MG TABS Take 1 tablet (10 mg total) by mouth daily. 11/08/17   Bensimhon, Shaune Pascal, MD  metolazone (ZAROXOLYN) 5 MG tablet Take 1 tablet (5 mg total) by mouth as needed. For weights 235 or greater with an extra 20 meq of 05/18/16   Clegg, Amy D, NP  potassium chloride SA (K-DUR,KLOR-CON) 20 MEQ tablet Take 3 tablets (60 mEq total) by mouth daily. Take extra 60mq if you take extra torsemide. Take 80 mg twice daily on metolazone days 09/13/16   Bensimhon, DShaune Pascal MD  spironolactone (ALDACTONE) 25 MG tablet Take 1 tablet (25 mg total) by mouth daily. 11/14/14   Bensimhon, DShaune Pascal MD  torsemide (DEMADEX) 20 MG tablet Take 2 tablets (40 mg total) by mouth 2 (two) times daily. 12/21/17   Bensimhon, DShaune Pascal MD    Family History Family History  Problem Relation Age of Onset  . Hypertension Mother   . Breast cancer Mother   . Heart failure Brother   . Brain cancer Maternal Uncle   . Brain cancer Maternal Uncle   . Cervical cancer Maternal Aunt   . Stroke Other         great Maternal grandmother  . Stroke Maternal Grandmother   . Alcohol abuse Father   . Sudden death Father     Social History Social History   Tobacco Use  . Smoking status: Former Smoker    Packs/day: 1.00    Years: 40.00    Pack years: 40.00    Types: Cigarettes    Last attempt to quit: 03/30/2011    Years since quitting: 6.7  . Smokeless tobacco: Never Used  Substance Use Topics  . Alcohol use: No  . Drug use: No  on home oxygen 2 lpm [place]   Allergies   Bactrim; Cinnamon; Morphine and related; Penicillins; Sulfa antibiotics; and Valdecoxib   Review of Systems Review of Systems  All other systems reviewed  and are negative.    Physical Exam Updated Vital Signs BP 104/64 (BP Location: Left Arm)   Pulse 86   Temp 97.9 F (36.6 C) (Oral)   Resp 14   SpO2 93%   Vital signs normal    Physical Exam  Constitutional: She is oriented to person, place, and time. She appears well-developed and well-nourished.  HENT:  Head: Normocephalic and atraumatic.  Right Ear: External ear normal.  Left Ear: External ear normal.  Nose: Nose normal.  Mouth/Throat: Oropharynx is clear and moist.  Eyes: Pupils are equal, round, and reactive to light. Conjunctivae and EOM are normal.  Neck: Normal range of motion.  Cardiovascular: Normal rate, regular rhythm and normal heart sounds.  Pulmonary/Chest: Tachypnea noted. She is in respiratory distress. She has decreased breath sounds. She has no wheezes.  Abdominal: She exhibits distension.  Musculoskeletal: She exhibits edema and tenderness.  Neurological: She is alert and oriented to person, place, and time.  Skin: Skin is warm and dry. There is erythema.  Psychiatric: She has a normal mood and affect. Her behavior is normal.  Nursing note and vitals reviewed.    ED Treatments / Results  Labs (all labs ordered are listed, but only abnormal results are displayed) Results for orders placed or performed during the hospital  encounter of 12/23/17  CBC  Result Value Ref Range   WBC 7.3 4.0 - 10.5 K/uL   RBC 5.59 (H) 3.87 - 5.11 MIL/uL   Hemoglobin 17.5 (H) 12.0 - 15.0 g/dL   HCT 54.7 (H) 36.0 - 46.0 %   MCV 97.9 78.0 - 100.0 fL   MCH 31.3 26.0 - 34.0 pg   MCHC 32.0 30.0 - 36.0 g/dL   RDW 15.2 11.5 - 15.5 %   Platelets 177 150 - 400 K/uL  Basic metabolic panel  Result Value Ref Range   Sodium 137 135 - 145 mmol/L   Potassium 3.3 (L) 3.5 - 5.1 mmol/L   Chloride 87 (L) 101 - 111 mmol/L   CO2 32 22 - 32 mmol/L   Glucose, Bld 92 65 - 99 mg/dL   BUN 17 6 - 20 mg/dL   Creatinine, Ser 1.23 (H) 0.44 - 1.00 mg/dL   Calcium 10.1 8.9 - 10.3 mg/dL   GFR calc non Af Amer 46 (L) >60 mL/min   GFR calc Af Amer 53 (L) >60 mL/min   Anion gap 18 (H) 5 - 15  Brain natriuretic peptide  Result Value Ref Range   B Natriuretic Peptide 563.1 (H) 0.0 - 100.0 pg/mL   Laboratory interpretation all normal except renal insufficiency, elevated hemoglobin consistent with chronic hypoxia, hypokalemia, mild elevation of BNP    EKG None  Radiology Dg Chest 2 View  Result Date: 12/23/2017 CLINICAL DATA:  Lower extremity swelling EXAM: CHEST - 2 VIEW COMPARISON:  Chest CT 04/03/2011 FINDINGS: There is cardiomegaly with enlarged pulmonary arteries. Mild interstitial pulmonary edema. No pleural effusion or pneumothorax. IMPRESSION: Cardiomegaly, enlarged pulmonary arteries and mild interstitial pulmonary edema. Electronically Signed   By: Ulyses Jarred M.D.   On: 12/23/2017 19:10    Procedures Procedures (including critical care time)  Medications Ordered in ED Medications - No data to display   Initial Impression / Assessment and Plan / ED Course  I have reviewed the triage vital signs and the nursing notes.  Pertinent labs & imaging results that were available during my care of the patient were reviewed by me and considered in my medical decision making (see chart  for details).     Patient presents the emergency  department for worsening peripheral edema.  She has been seen by cardiology and is being admitted.  Final Clinical Impressions(s) / ED Diagnoses   Final diagnoses:  Peripheral edema    Plan admission  Rolland Porter, MD, Barbette Or, MD 12/23/17 2337

## 2017-12-23 NOTE — H&P (Signed)
Cardiology Admission History and Physical:   Patient ID: Alexis [name]; MRN: [id number]; DOB: 10-31-1955   Admission date: 12/23/2017  Primary Care Provider: Joseph Art, MD Primary Cardiologist: Bensimhon  Chief Complaint:  Volume overload  Patient Profile:   Alexis [name] is a 62 year old female with history of obesity, severe COPD, mixed PAH and diastolic dysfunction who presents with volume overload.   History of Present Illness:   Alexis [name] is a 62 year old female with history of obesity, severe COPD, mixed PAH and diastolic dysfunction who presents with volume overload.   The patient has a history of mixed PAH (Group I-III) and has been treated with macitentan and tadalafil. She follows with Dr. Haroldine Laws who last saw the patient in clinic two days ago. At this visit, she reported worsening LE edema. Her swelling was thought to be due in part to her macitentan, although an element of volume overload was also suspected. Her torsemide was increased and she was encouraged to increase frequency of her metolazone.   She presents to the ED this evening with ongoing edema. In the ED, SBP 90s-100s with HR 80s-90s. O2 93% on home 2L O2. Labs notable for BNP 563 (was 118 when last measured in 2017). Cr 1.23 up from 0.85 in clinic. Hgb 17.5. CXR showed pulmonary edema.   On my evaluation, the patient reports that she has had ongoing LE edema despite increase in her diuretic. She denies any chest pain or dyspnea. She reports chronic, stable orthopnea.  She does not weigh herself at home but thinks that she is 10-15 lbs above her dry weight. When reviewing her medications, she states that she has not taken her macitentan in over two weeks and has not taken her tadalafil in months.    Past Medical History:  Diagnosis Date  . Body mass index 40.0-44.9, adult ([place])   . Cardiomegaly    Normal LV systolic function ejection fraction 55-60% with moderate decrease in RV function.  .  Chronic obstructive asthma with exacerbation (HCC)    CT scan was a pattern but no evidence of pulmonary embolus and., Negative VQ scan July 2012 pulmonary function test FVC 1.17 L per minute 37% of predicted, FEV1 1 L 40% of predicted. DLCO 61% of predicted no response to bronchodilators negative HIV serology, ANA in scleroderma.  . Edema   . Hepatomegaly   . Obesity, unspecified   . Obstructive sleep apnea    BiPAP initiated July 2012  . Other and unspecified hyperlipidemia   . Other chronic pulmonary heart diseases   . Other diseases of lung, not elsewhere classified   . Pulmonary arterial hypertension (HCC)     PA pressure 112/54 mmHg the mean of 74 mmHg, right atrial pressure mean 25 mmHg. Pulmonary vascular resistance 13 with units. Cardiac output 5.8 L. Normal coronary arteries with normal LV function July 2012   . SOB (shortness of breath) on exertion   . Tobacco use disorder     Past Surgical History:  Procedure Laterality Date  . CARDIAC CATHETERIZATION  04/02/2011  . CARDIAC CATHETERIZATION N/A 08/04/2015   Procedure: Right/Left Heart Cath and Coronary Angiography;  Surgeon: Jolaine Artist, MD;  Location: [place] [facility];  Service: Cardiovascular;  Laterality: N/A;  . CARDIAC CATHETERIZATION N/A 09/15/2016   Procedure: Right Heart Cath;  Surgeon: Jolaine Artist, MD;  Location: [place] [facility];  Service: Cardiovascular;  Laterality: N/A;  . CESAREAN SECTION    . DILATION AND CURETTAGE OF UTERUS    .  RIGHT HEART CATHETERIZATION N/A 10/06/2012   Procedure: RIGHT HEART CATH;  Surgeon: Jolaine Artist, MD;  Location: [facility];  Service: Cardiovascular;  Laterality: N/A;  . TUBAL LIGATION       Medications Prior to Admission: Prior to Admission medications   Medication Sig Start Date End Date Taking? Authorizing Provider  acetaminophen (TYLENOL) 500 MG tablet Take 1,000 mg by mouth every 6 (six) hours as needed. For pain    [provider]  albuterol  (PROVENTIL HFA;VENTOLIN HFA) 108 (90 BASE) MCG/ACT inhaler Inhale 2 puffs into the lungs every 4 (four) hours as needed for wheezing or shortness of breath.    [provider]  alum & mag hydroxide-simeth (MAALOX/MYLANTA) 200-200-20 MG/5ML suspension Take 30 mLs by mouth every 6 (six) hours as needed for indigestion or heartburn.    [provider]  aspirin 81 MG EC tablet Take 81 mg by mouth daily.      [provider]  Aspirin-Acetaminophen-Caffeine (GOODY HEADACHE PO) Take 1 Package by mouth daily as needed (Severe Headaches).     [provider]  Calcium Carbonate Antacid (ALKA-SELTZER ANTACID PO) Take 1 tablet by mouth daily as needed (heartburn).    [provider]  diphenhydrAMINE (BENADRYL) 25 MG tablet Take 25 mg by mouth every 6 (six) hours as needed.    [provider]  fluticasone furoate-vilanterol (BREO ELLIPTA) 100-25 MCG/INH AEPB Inhale 1 puff into the lungs daily. 06/06/17   Rigoberto Noel, MD  linaclotide (LINZESS) 72 MCG capsule Take 72 mcg by mouth daily before breakfast.     [provider]  Macitentan (OPSUMIT) 10 MG TABS Take 1 tablet (10 mg total) by mouth daily. 11/08/17   Bensimhon, Shaune Pascal, MD  metolazone (ZAROXOLYN) 5 MG tablet Take 1 tablet (5 mg total) by mouth as needed. For weights 235 or greater with an extra 20 meq of 05/18/16   Clegg, Amy D, NP  potassium chloride SA (K-DUR,KLOR-CON) 20 MEQ tablet Take 3 tablets (60 mEq total) by mouth daily. Take extra 60mq if you take extra torsemide. Take 80 mg twice daily on metolazone days 09/13/16   Bensimhon, DShaune Pascal MD  spironolactone (ALDACTONE) 25 MG tablet Take 1 tablet (25 mg total) by mouth daily. 11/14/14   Bensimhon, DShaune Pascal MD  torsemide (DEMADEX) 20 MG tablet Take 2 tablets (40 mg total) by mouth 2 (two) times daily. 12/21/17   Bensimhon, DShaune Pascal MD     Allergies:    Allergies  Allergen Reactions  . Bactrim     Face and eye swelling  . Cinnamon      wheezing  . Morphine And Related Other (See Comments)    Severe headache  . Penicillins     "doesn't work" got a lot as a child that doesn't work. But no systemic reaction.  . Sulfa Antibiotics Itching  . Valdecoxib     Face swelling    Social History:   Social History   Socioeconomic History  . Marital status: Married    Spouse name: Not on file  . Number of children: Not on file  . Years of education: Not on file  . Highest education level: Not on file  Occupational History  . Occupation: disabled  Social Needs  . Financial resource strain: Not on file  . Food insecurity:    Worry: Not on file    Inability: Not on file  . Transportation needs:    Medical: Not on file  Non-medical: Not on file  Tobacco Use  . Smoking status: Former Smoker    Packs/day: 1.00    Years: 40.00    Pack years: 40.00    Types: Cigarettes    Last attempt to quit: 03/30/2011    Years since quitting: 6.7  . Smokeless tobacco: Never Used  Substance and Sexual Activity  . Alcohol use: No  . Drug use: No  . Sexual activity: Not on file  Lifestyle  . Physical activity:    Days per week: Not on file    Minutes per session: Not on file  . Stress: Not on file  Relationships  . Social connections:    Talks on phone: Not on file    Gets together: Not on file    Attends religious service: Not on file    Active member of club or organization: Not on file    Attends meetings of clubs or organizations: Not on file    Relationship status: Not on file  . Intimate partner violence:    Fear of current or ex partner: Not on file    Emotionally abused: Not on file    Physically abused: Not on file    Forced sexual activity: Not on file  Other Topics Concern  . Not on file  Social History Narrative  . Not on file    Family History:  The patient's family history includes Alcohol abuse in her father; Brain cancer in her maternal uncle and maternal uncle; Breast cancer in her mother; Cervical cancer  in her maternal aunt; Heart failure in her brother; Hypertension in her mother; Stroke in her maternal grandmother and other; Sudden death in her father.    ROS:  Please see the history of present illness.  All other ROS reviewed and negative.     Physical Exam/Data:   Vitals:   12/23/17 1743 12/23/17 1955 12/23/17 2153  BP: 105/66 93/72 104/64  Pulse: 90 89 86  Resp: _0 Temp: 98.4 F (36.9 C) 97.9 F (36.6 C)   TempSrc: Oral Oral   SpO2: 94% 93% 93%   No intake or output data in the 24 hours ending 12/23/17 2250 There were no vitals filed for this visit. There is no height or weight on file to calculate BMI.  General:  Chronically ill appearing, in no acute distress HEENT: several missing teeth. Wearing O2 via [place] Lymph: no adenopathy Neck: JVD elevated at jaw sitting upright Cardiac:  normal S1, S2; RRR; faint systolic murmur Lungs:  clear to auscultation bilaterally, no wheezing, rhonchi or rales  Abd: soft, nontender, no hepatomegaly  Ext: Firm with 2-3+ pitting LE edema Musculoskeletal:  No deformities, BUE and BLE strength normal and equal Skin: warm and dry  Neuro:  No focal abnormalities noted Psych:  Normal affect    EKG:  Pending   Relevant CV Studies: RHC 09/15/16  RA = 13 RV = 106/20 PA = 108/52 (65) PCW = 12 Fick cardiac output/index = 5.7/2.8 PVR = 11.0 WU Ao sat = 93% PA sat = 71%, 72% SVC sat = 65% RA sat = 75  PFTs 10/12/16  FEV1 0.67L (29%) FVC  0.81 (32%) DLCO 52%  July 2012 pulmonary function test FVC 1.17 L per minute 37% of predicted, FEV1 1 L 40% of predicted. DLCO 61%  ECHO 06/2011 EF 60-65%. RV moderately dilated and mild to moderately hypokinetic with septal flattening RVSP 77 ECHO 10/02/12 Poor quality. EF 60-65% RV does appear better.  ECHO  1/15 EF 65% RV mildly dilated/HK D-shaped septum RVSP 72 ECHO 11/16 EF 60% RVSP 115 (but 70 on follow-up cath)  Echo 12/17 EF 60% RV markedly dilated but function relatively  preserved   07/12/11          6MW at pulmonary rehab unchanged from August 2012. 900 feet. 04/26/12            6MW 870 feet O2 sats 93-94% on 2 liters [place]. 10/02/12            6MW 830 ft on 2L O2 with sats 87-90% 04/16/13            6MW 760 ft on 2L O2 sats 89-93% 01/21/14            6MW 600 ft on 2L O2 sats 88-91%  Unable to do further 6MW  10/06/12 [facility] on adcirca and macitentan  RA = 15  RV = 91/15/20  PA = 90/41 (64)  PCW = 20  Fick cardiac output/index = 5.2/2.5  PVR = 8.4 [place]  FA sat = 95%  PA sat = 69%, 73%  High SVC sat = 71%  08/04/15 RHC on adcirca and macitentan RA = 14 RV = 70/11/16 PA = 73/31 (52) PCW = 15 Ao = 91/55 (68) LV = 99/6/20 Fick cardiac output/index = 5.5/2.7 PVR = 6.8 WU SVR = 791 dynes FA sat = 96% PA sat = 66%, 67%  08/04/15 [facility]  Normal coronaries with separate ostia for LAD and LCX  Initial RHC April 02, 2011.  RA mean of 25 RV pressure is 117/13 PA 112/54 with mean of 74.  CO 5.8 liters. CI 3.3 liters.   PVR approximately 13 [place] units. Started on sildenafil (switched to tadalafil)   Laboratory Data:  Chemistry Recent Labs  Lab 12/21/17 1527 12/23/17 1801  NA 139 137  K 3.8 3.3*  CL 101 87*  CO2 28 32  GLUCOSE 112* 92  BUN 12 17  CREATININE 0.85 1.23*  CALCIUM 9.2 10.1  GFRNONAA >60 46*  GFRAA >60 53*  ANIONGAP 10 18*    No results for input(s): PROT, ALBUMIN, AST, ALT, ALKPHOS, BILITOT in the last 168 hours. Hematology Recent Labs  Lab 12/23/17 1801  WBC 7.3  RBC 5.59*  HGB 17.5*  HCT 54.7*  MCV 97.9  MCH 31.3  MCHC 32.0  RDW 15.2  PLT 177   Cardiac EnzymesNo results for input(s): TROPONINI in the last 168 hours. No results for input(s): TROPIPOC in the last 168 hours.  BNP Recent Labs  Lab 12/23/17 1801  BNP 563.1*    DDimer No results for input(s): DDIMER in the last 168 hours.  Radiology/Studies:  Dg Chest 2 View  Result Date: 12/23/2017 CLINICAL DATA:  Lower extremity swelling EXAM: CHEST - 2  VIEW COMPARISON:  Chest CT 04/03/2011 FINDINGS: There is cardiomegaly with enlarged pulmonary arteries. Mild interstitial pulmonary edema. No pleural effusion or pneumothorax. IMPRESSION: Cardiomegaly, enlarged pulmonary arteries and mild interstitial pulmonary edema. Electronically Signed   By: Ulyses Jarred M.D.   On: 12/23/2017 19:10    Assessment and Plan:   Acute on chronic diastolic HF (R>>L HF) The patient has HFpEF and R heart failure in the setting of her chronic pulmonary hypertension. She presents with ongoing LE edema despite attempt of aggressive outpatient diuresis. She has signs of volume overload on exam but denies other new or different cardiac symptoms. Will admit for IV diuresis. -Giving IV furosemide 80 mg x1. Will assess response  and re-dose as appropriate. May need higher dose and/or addition of metolazone -Continue spironolactone (cautiously in setting of AKI) -Repeating echocardiogram given progressive clinical disease and non-adherence with PH medications  Mixed pulmonary hypertension (Group I-III) The patient has element of Group I PAH as well as Group II/III from chronic heart and lung disease. She has been treated with vasodilators and macicentan but tells me on admission that she has not taken Adcirca in months and macicentan in weeks. Unclear to what degree this may be contributing to her decompensation. -Diuresing as above -Echocardiogram ordered to evaluate for worsening PHTN and R heart failure. May require repeat RHC. -Will not order PH medications as she has not taken them in weeks/months. Will need evaluation for restarting or changing therapy and counseling on medication compliance.   AKI Cr 1.23 in ED up from 0.85 in clinic two days ago. Given ongoing edema, may be due to renal congestion, although this rise occurred in the setting of diuretic increase. Unclear therefore if there could be an element of intravascular depletion. -Will continue diuresis and  monitor response. If renal function continues to worsen, may need RHC  COPD OSA Oxygen saturation low-normal on home 2L O2. Not currently taking Breo due to cost -Continue O2 -Albuterol PRN -CPAP ordered  Polycythemia Hgb increased to 17.5. Other CBC parameters wnl, which makes concentration unlikely. Unclear etiology, but may be due to chronic hypoxia. -Continue to monitor  Severity of Illness: The appropriate patient status for this patient is INPATIENT. Inpatient status is judged to be reasonable and necessary in order to provide the required intensity of service to ensure the patient's safety. The patient's presenting symptoms, physical exam findings, and initial radiographic and laboratory data in the context of their chronic comorbidities is felt to place them at high risk for further clinical deterioration. Furthermore, it is not anticipated that the patient will be medically stable for discharge from the hospital within 2 midnights of admission. The following factors support the patient status of inpatient.   " The patient's presenting symptoms include edema. " The worrisome physical exam findings include hypervolemia. " The initial radiographic and laboratory data are worrisome because of AKI, pulmonary edema. " The chronic co-morbidities include heart failure, pulmonary hypertension.   * I certify that at the point of admission it is my clinical judgment that the patient will require inpatient hospital care spanning beyond 2 midnights from the point of admission due to high intensity of service, high risk for further deterioration and high frequency of surveillance required.*    For questions or updates, please contact [place] Please consult [email or web address] for contact info under Cardiology/STEMI.    Signed, Nila Nephew, MD  12/23/2017 10:50 PM

## 2017-12-23 NOTE — Telephone Encounter (Signed)
Pt called c/o swelling in her feet and legs despite medication changes. Per Nira Conn patient needs to be seen in the emergency room. Dr.Bensimhons last note stated if patients symptoms did not get any better she would need to be admitted to get diuresed.

## 2017-12-23 NOTE — ED Triage Notes (Signed)
Patient from home with increased swelling to the legs.  Called Bensimhon MD office and they sated to come to the ED to be admitted to be diuresed. A&Ox4, on O2 at home 2Lpm.  Swelling began 3 weeks ago.

## 2017-12-24 DIAGNOSIS — I2721 Secondary pulmonary arterial hypertension: Secondary | ICD-10-CM

## 2017-12-24 LAB — CBC
HCT: 53.3 % — ABNORMAL HIGH (ref 36.0–46.0)
HEMOGLOBIN: 17.4 g/dL — AB (ref 12.0–15.0)
MCH: 31.5 pg (ref 26.0–34.0)
MCHC: 32.6 g/dL (ref 30.0–36.0)
MCV: 96.4 fL (ref 78.0–100.0)
Platelets: 178 10*3/uL (ref 150–400)
RBC: 5.53 MIL/uL — AB (ref 3.87–5.11)
RDW: 15.2 % (ref 11.5–15.5)
WBC: 6.7 10*3/uL (ref 4.0–10.5)

## 2017-12-24 LAB — BASIC METABOLIC PANEL
Anion gap: 17 — ABNORMAL HIGH (ref 5–15)
Anion gap: 16 — ABNORMAL HIGH (ref 5–15)
BUN: 18 mg/dL (ref 6–20)
BUN: 20 mg/dL (ref 6–20)
CHLORIDE: 86 mmol/L — AB (ref 101–111)
CHLORIDE: 87 mmol/L — AB (ref 101–111)
CO2: 34 mmol/L — AB (ref 22–32)
CO2: 32 mmol/L (ref 22–32)
CREATININE: 1.14 mg/dL — AB (ref 0.44–1.00)
Calcium: 10 mg/dL (ref 8.9–10.3)
Calcium: 10.1 mg/dL (ref 8.9–10.3)
Creatinine, Ser: 1.01 mg/dL — ABNORMAL HIGH (ref 0.44–1.00)
GFR calc Af Amer: 58 mL/min — ABNORMAL LOW (ref >60)
GFR calc non Af Amer: 50 mL/min — ABNORMAL LOW (ref >60)
GFR calc non Af Amer: 58 mL/min — ABNORMAL LOW (ref >60)
GLUCOSE: 162 mg/dL — AB (ref 65–99)
Glucose, Bld: 192 mg/dL — ABNORMAL HIGH (ref 65–99)
POTASSIUM: 2.8 mmol/L — AB (ref 3.5–5.1)
POTASSIUM: 3.7 mmol/L (ref 3.5–5.1)
SODIUM: 137 mmol/L (ref 135–145)
SODIUM: 135 mmol/L (ref 135–145)

## 2017-12-24 LAB — MAGNESIUM: MAGNESIUM: 1.8 mg/dL (ref 1.7–2.4)

## 2017-12-24 LAB — HIV ANTIBODY (ROUTINE TESTING W REFLEX): HIV Screen 4th Generation wRfx: NONREACTIVE

## 2017-12-24 MED ORDER — MAGNESIUM SULFATE 2 GM/50ML IV SOLN
2.0000 g | Freq: Once | INTRAVENOUS | Status: AC
  Administered 2017-12-24: 2 g via INTRAVENOUS
  Filled 2017-12-24: qty 50

## 2017-12-24 MED ORDER — ORAL CARE MOUTH RINSE
15.0000 mL | Freq: Two times a day (BID) | OROMUCOSAL | Status: DC
  Administered 2017-12-24 (×2): 15 mL via OROMUCOSAL

## 2017-12-24 MED ORDER — ALBUTEROL SULFATE (2.5 MG/3ML) 0.083% IN NEBU
2.5000 mg | INHALATION_SOLUTION | RESPIRATORY_TRACT | Status: DC | PRN
Start: 2017-12-24 — End: 2017-12-25

## 2017-12-24 MED ORDER — POTASSIUM CHLORIDE 20 MEQ PO PACK
40.0000 meq | PACK | Freq: Two times a day (BID) | ORAL | Status: DC
  Filled 2017-12-24: qty 2

## 2017-12-24 MED ORDER — ACETAMINOPHEN 325 MG PO TABS
650.0000 mg | ORAL_TABLET | ORAL | Status: DC | PRN
  Administered 2017-12-24 – 2017-12-25 (×2): 650 mg via ORAL
  Filled 2017-12-24 (×2): qty 2

## 2017-12-24 MED ORDER — SPIRONOLACTONE 25 MG PO TABS
25.0000 mg | ORAL_TABLET | Freq: Every day | ORAL | Status: DC
  Administered 2017-12-24 – 2017-12-25 (×2): 25 mg via ORAL
  Filled 2017-12-24 (×2): qty 1

## 2017-12-24 MED ORDER — POTASSIUM CHLORIDE CRYS ER 20 MEQ PO TBCR
40.0000 meq | EXTENDED_RELEASE_TABLET | Freq: Once | ORAL | Status: AC
  Administered 2017-12-24: 40 meq via ORAL
  Filled 2017-12-24: qty 2

## 2017-12-24 MED ORDER — HEPARIN SODIUM (PORCINE) 5000 UNIT/ML IJ SOLN
5000.0000 [IU] | Freq: Three times a day (TID) | INTRAMUSCULAR | Status: DC
  Administered 2017-12-24 – 2017-12-25 (×4): 5000 [IU] via SUBCUTANEOUS
  Filled 2017-12-24 (×4): qty 1

## 2017-12-24 MED ORDER — FUROSEMIDE 10 MG/ML IJ SOLN
80.0000 mg | Freq: Two times a day (BID) | INTRAMUSCULAR | Status: AC
  Administered 2017-12-24 (×2): 80 mg via INTRAVENOUS
  Filled 2017-12-24 (×2): qty 8

## 2017-12-24 MED ORDER — POTASSIUM CHLORIDE CRYS ER 20 MEQ PO TBCR
40.0000 meq | EXTENDED_RELEASE_TABLET | Freq: Two times a day (BID) | ORAL | Status: DC
  Administered 2017-12-24 – 2017-12-25 (×3): 40 meq via ORAL
  Filled 2017-12-24 (×3): qty 2

## 2017-12-24 MED ORDER — ASPIRIN EC 81 MG PO TBEC
81.0000 mg | DELAYED_RELEASE_TABLET | Freq: Every day | ORAL | Status: DC
  Administered 2017-12-24 – 2017-12-25 (×2): 81 mg via ORAL
  Filled 2017-12-24 (×3): qty 1

## 2017-12-24 MED ORDER — POTASSIUM CHLORIDE 10 MEQ/100ML IV SOLN
10.0000 meq | INTRAVENOUS | Status: AC
  Administered 2017-12-24: 10 meq via INTRAVENOUS
  Filled 2017-12-24 (×2): qty 100

## 2017-12-24 MED ORDER — SODIUM CHLORIDE 0.9% FLUSH
3.0000 mL | Freq: Two times a day (BID) | INTRAVENOUS | Status: DC
  Administered 2017-12-24 (×2): 3 mL via INTRAVENOUS

## 2017-12-24 MED ORDER — SODIUM CHLORIDE 0.9 % IV SOLN: 250.0000 mL | INTRAVENOUS | Status: DC | PRN

## 2017-12-24 MED ORDER — ALBUTEROL SULFATE HFA 108 (90 BASE) MCG/ACT IN AERS: 2.0000 | INHALATION_SPRAY | RESPIRATORY_TRACT | Status: DC | PRN

## 2017-12-24 MED ORDER — SODIUM CHLORIDE 0.9% FLUSH: 3.0000 mL | INTRAVENOUS | Status: DC | PRN

## 2017-12-24 NOTE — ED Notes (Signed)
Pt placed in hospital bed for comfort.

## 2017-12-24 NOTE — Progress Notes (Signed)
Patient unable to tolerate ordered IV potassium even at slower rate.  RN text paged Cardiology with this information.

## 2017-12-24 NOTE — Progress Notes (Signed)
Advanced Heart Failure Rounding Note   Subjective:    Admitted last night with fluid overload refractory to increase in home diuretic regimen. Responding well to IV lasix. Weight down to 213. Breathing better. No orthopnea or PND.    Objective:   Weight Range:  Vital Signs:   Temp:  [97.5 F (36.4 C)-98.4 F (36.9 C)] 97.5 F (36.4 C) (04/20 0542) Pulse Rate:  [75-99] 96 (04/20 0542) Resp:  [14-24] 24 (04/20 0300) BP: (93-141)/(59-89) 141/73 (04/20 0542) SpO2:  [92 %-95 %] 92 % (04/20 0542) Weight:  [96.9 kg (213 lb 9.6 oz)] 96.9 kg (213 lb 9.6 oz) (04/20 0500) Last BM Date: 12/23/17  Weight change: Filed Weights   12/24/17 0500  Weight: 96.9 kg (213 lb 9.6 oz)    Intake/Output:   Intake/Output Summary (Last 24 hours) at 12/24/2017 0835 Last data filed at 12/24/2017 0828 Gross per 24 hour  Intake 470 ml  Output 1875 ml  Net -1405 ml     Physical Exam: General:  Sitting up in bed NAD HEENT: normal anicteric Neck: supple. JVP 10-12 . Carotids 2+ bilat; no bruits. No lymphadenopathy or thryomegaly appreciated. Cor: PMI nondisplaced. Regular rate & rhythm. 2/6 TR  Lungs: clear with decreased breath sounds no wheeze Abdomen: obese soft, nontender, nondistended. No hepatosplenomegaly. No bruits or masses. Good bowel sounds. Extremities: no cyanosis, clubbing, rash, edema 1+ edema Neuro: alert & orientedx3, cranial nerves grossly intact. moves all 4 extremities w/o difficulty. Affect pleasant  Telemetry: NSR 90s Personally reviewed   Labs: Basic Metabolic Panel: Recent Labs  Lab 12/21/17 1527 12/23/17 1801 12/24/17 0256  NA 139 137 137  K 3.8 3.3* 2.8*  CL 101 87* 86*  CO2 28 32 34*  GLUCOSE 112* 92 162*  BUN _0 CREATININE 0.85 1.23* 1.14*  CALCIUM 9.2 10.1 10.0  MG  --   --  1.8    Liver Function Tests: No results for input(s): AST, ALT, ALKPHOS, BILITOT, PROT, ALBUMIN in the last 168 hours. No results for input(s): LIPASE, AMYLASE in the  last 168 hours. No results for input(s): AMMONIA in the last 168 hours.  CBC: Recent Labs  Lab 12/23/17 1801 12/24/17 0256  WBC 7.3 6.7  HGB 17.5* 17.4*  HCT 54.7* 53.3*  MCV 97.9 96.4  PLT 177 178    Cardiac Enzymes: No results for input(s): CKTOTAL, CKMB, CKMBINDEX, TROPONINI in the last 168 hours.  BNP: BNP (last 3 results) Recent Labs    12/23/17 1801  BNP 563.1*    ProBNP (last 3 results) No results for input(s): PROBNP in the last 8760 hours.    Other results:  Imaging: Dg Chest 2 View  Result Date: 12/23/2017 CLINICAL DATA:  Lower extremity swelling EXAM: CHEST - 2 VIEW COMPARISON:  Chest CT 04/03/2011 FINDINGS: There is cardiomegaly with enlarged pulmonary arteries. Mild interstitial pulmonary edema. No pleural effusion or pneumothorax. IMPRESSION: Cardiomegaly, enlarged pulmonary arteries and mild interstitial pulmonary edema. Electronically Signed   By: Ulyses Jarred M.D.   On: 12/23/2017 19:10      Medications:     Scheduled Medications: . aspirin EC  81 mg Oral Daily  . furosemide  80 mg Intravenous BID  . heparin  5,000 Units Subcutaneous Q8H  . mouth rinse  15 mL Mouth Rinse BID  . sodium chloride flush  3 mL Intravenous Q12H  . spironolactone  25 mg Oral Daily     Infusions: . sodium chloride    . potassium  chloride Stopped (12/24/17 0625)     PRN Medications:  sodium chloride, acetaminophen, albuterol, sodium chloride flush   Assessment:   Mrs Barcelona is a 62 year old female with history of obesity, severe COPD, mixed PAH and diastolic dysfunction. Admitted with volume overload     Plan/Discussion:    1) Acute on chronic diastolic HF (R>>L HF): - Volume status improving with IV lasix likely needs one more day 2) PAH: Mixed.  She has severe COPD with a component of group 3 PAH but also Group 1 PAH with significant improvement in PA pressures and symptoms with vasodilators.   - Remains NYHA IIIIB symptoms on macitentan and  Adcirca.  - Recent RHC with severe PAH (PAs > 100) but given severity of lung disease FEV1 0.69L Will not add selexapeg give risk of shunting.  - If unable to control edema well may need to stop macitentan - Continue O2 - Stressed need for CPAP 3) Chronic hypoxic respiratory failure due to COPD on home O2: Continuous 2 liters [place].  - PFTs very severe. Follows with Pulmonary 4) OSA -  Again encouraged to use CPAP - She has severe polycythemia likely related to nocturnal hypoxemia 5) Morbid obesity - Encouraged weight loss.  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 - Saw Dr Benjamine Mola and [facility]. Tumor felt to be small. Just watchful waiting.  7) Hypokalemia - will supp    Length of Stay: 1   Glori Bickers MD 12/24/2017, 8:35 AM  Advanced Heart Failure Team Pager [phone] (M-F; [place])  Please contact [facility] for night-coverage after hours (4p -7a ) and weekends on [email or web address]

## 2017-12-25 ENCOUNTER — Inpatient Hospital Stay (HOSPITAL_COMMUNITY): Payer: Medicare Other

## 2017-12-25 ENCOUNTER — Encounter (HOSPITAL_COMMUNITY): Payer: Self-pay | Admitting: Physician Assistant

## 2017-12-25 LAB — BASIC METABOLIC PANEL
ANION GAP: 15 (ref 5–15)
BUN: 25 mg/dL — AB (ref 6–20)
CHLORIDE: 91 mmol/L — AB (ref 101–111)
CO2: 31 mmol/L (ref 22–32)
Calcium: 9.8 mg/dL (ref 8.9–10.3)
Creatinine, Ser: 1.1 mg/dL — ABNORMAL HIGH (ref 0.44–1.00)
GFR calc Af Amer: >60 mL/min (ref >60)
GFR, EST NON AFRICAN AMERICAN: 52 mL/min — AB (ref >60)
Glucose, Bld: 150 mg/dL — ABNORMAL HIGH (ref 65–99)
POTASSIUM: 3.7 mmol/L (ref 3.5–5.1)
SODIUM: 137 mmol/L (ref 135–145)

## 2017-12-25 LAB — BLOOD GAS, ARTERIAL
ACID-BASE EXCESS: 10 mmol/L — AB (ref 0.0–2.0)
Bicarbonate: 34.3 mmol/L — ABNORMAL HIGH (ref 20.0–28.0)
Drawn by: 24610
O2 CONTENT: 2 L/min
O2 SAT: 96.2 %
PATIENT TEMPERATURE: 98.6
PCO2 ART: 49.1 mmHg — AB (ref 32.0–48.0)
pH, Arterial: 7.459 — ABNORMAL HIGH (ref 7.350–7.450)
pO2, Arterial: 88.4 mmHg (ref 83.0–108.0)

## 2017-12-25 MED ORDER — MACITENTAN 10 MG PO TABS: 10.0000 mg | ORAL_TABLET | Freq: Every day | ORAL | 3 refills | Status: AC

## 2017-12-25 MED ORDER — MACITENTAN 10 MG PO TABS
10.0000 mg | ORAL_TABLET | Freq: Every day | ORAL | Status: DC
  Administered 2017-12-25: 10 mg via ORAL
  Filled 2017-12-25: qty 1

## 2017-12-25 MED ORDER — TADALAFIL (PAH) 20 MG PO TABS: 40.0000 mg | ORAL_TABLET | Freq: Every day | ORAL | 3 refills | Status: AC

## 2017-12-25 MED ORDER — POTASSIUM CHLORIDE CRYS ER 20 MEQ PO TBCR: EXTENDED_RELEASE_TABLET | ORAL | 1 refills | Status: AC

## 2017-12-25 MED ORDER — METOLAZONE 5 MG PO TABS: ORAL_TABLET | ORAL | 3 refills | Status: AC

## 2017-12-25 MED ORDER — TADALAFIL (PAH) 20 MG PO TABS
40.0000 mg | ORAL_TABLET | Freq: Every day | ORAL | Status: DC
  Filled 2017-12-25: qty 2

## 2017-12-25 NOTE — Progress Notes (Signed)
Patient refused Tadalafil 40 mg, stating that it causes her to hurt all over.  Fabian Sharp, PA notified.

## 2017-12-25 NOTE — Progress Notes (Signed)
Referral received for CPAP need- per [facility] with [facility]- pt is unable to get a new cpap without a new sleep study. [facility] suggested pt may could qualify for a NIV- would need ABG- order for ABG given per Dr. Jacinto Reap- however results of ABG do not qualify pt for an NIV- could try a pulmonary function test to see if pt could qualify for NIV with that- otherwise will need to f/u outpt regarding a new cpap with pulmonary. Have spoken with RN-Jessica who will f/u with MD.

## 2017-12-25 NOTE — Discharge Summary (Addendum)
Discharge Summary    Patient ID: Alexis [name],  MRN: [id number], DOB/AGE: September 17, 1955 62 y.o.  Admit date: 12/23/2017 Discharge date: 12/25/2017  Primary Care Provider: Joaquim Lai T Primary Cardiologist: Glori Bickers, MD  Discharge Diagnoses    Principal Problem:   Acute on chronic diastolic (congestive) heart failure ([place]) Active Problems:   Pulmonary arterial hypertension (HCC)   Obstructive sleep apnea   COPD (chronic obstructive pulmonary disease) with emphysema (HCC)   Chronic anticoagulation   Chronic diastolic heart failure (HCC)   Morbid obesity (HCC)   Acute exacerbation of CHF (congestive heart failure) (HCC)   Allergies Allergies  Allergen Reactions  . Bactrim     Face and eye swelling  . Cinnamon     wheezing  . Morphine And Related Other (See Comments)    Severe headache  . Penicillins     "doesn't work" got a lot as a child that doesn't work. But no systemic reaction.  . Sulfa Antibiotics Itching  . Valdecoxib     Face swelling    Diagnostic Studies/Procedures    Echo 08/31/16: Study Conclusions - Procedure narrative: Transthoracic echocardiography. Image   quality was adequate. The study was technically difficult, as a   result of poor acoustic windows, poor sound wave transmission,   and body habitus. - Left ventricle: The cavity size was normal. Wall thickness was   increased in a pattern of mild LVH. Systolic function was normal.   The estimated ejection fraction was in the range of 60% to 65%.   Doppler parameters are consistent with abnormal left ventricular   relaxation (grade 1 diastolic dysfunction). The E/e&' ratio is   >15, suggesting elevated LV filling pressure. - Left atrium: The atrium was mildly dilated.  Impressions:  - Very technically difficult study with poor echo windows. Contrast   was not given. LVEF estimated at 60-65%, mild LVH, diastolic   dysfunction and elevated LV filling pressure. Right heart  not   well visualized, cannot estimate RVSP. Consider right heart cath   for direct measurement of pulmonary pressure.   Right heart cath 09/15/16: Assessment: 1. Severe PAH with significant worsening since last RHC  2. Normal cardiac output with evidence of RV strain  Plan/Discussion: PAH much worse since previous. Patient reports compliance with current medications and supplemental O2. Will need to discuss possible IV prostanoid therapy.     History of Present Illness     Alexis [name] is a 62 year old female with history of obesity, severe COPD, mixed PAH and diastolic dysfunction who presents with volume overload.   The patient has a history of mixed PAH (Group I-III) and has been treated with macitentan and tadalafil. She follows with Dr. Haroldine Laws who last saw the patient in clinic two days ago. At this visit, she reported worsening LE edema. Her swelling was thought to be due in part to her macitentan, although an element of volume overload was also suspected. Her torsemide was increased and she was encouraged to increase frequency of her metolazone.   Presented to the ED on 4/19 with ongoing edema and orthopnea. She does not weigh herself at home but felt that she was 10-15 lbs above her dry weight. When reviewing her medications, she told the admitting MD that she has not taken her macitentan in over two weeks and has not taken her tadalafil in months.  In the ED, SBP 90s-100s with HR 80s-90s. O2 93% on home 2L O2. Labs notable for BNP 563 (was  118 when last measured in 2017). Cr 1.23 up from 0.85 in clinic. Hgb 17.5. CXR showed pulmonary edema.    Hospital Course     Consultants: none  Acute on chronic diastolic heart failure Pt presented with shortness of breath, lower extremity edema, and stable orthopnea. She reports a 10-15 lb weight gain. She received IV diuresis. At discharge, she is overall net negative 3.6 L with 2.8 L urine output yesterday. Weight is 210 lbs, down  from 213 lbs. On exam, volume status much improved. Increased torsemide to 40 mg BID and increased potassium to 60 mEq in the AM and 40 mEq in the PM.   Pulmonary hypertension - mixed Her severe COPD with a component of group 3 PAH but also group 1 PAH improved with vasodilators. RemainsNYHA IIIIB symptomson macitentan and Adcirca (though she states she has not been taking). Recent RHC with severe PAH (PAs > 100) but given severity of lung disease FEV1 0.69L, will not add selexapeg given risk of shunting. Continue home O2 and encouraged CPAP use for OSA.   Chronic hypoxic respiratory failure due to COPD Continue home O2, 2 L [place]. Follow up with pulmonary.  OSA Needs CPAP. Discussed with RN and Case manger about getting CPAP renewed at home. Told patient needed a new sleep study. ABG drawn to see if she would qualify for home vent for Bipap.   Morbid obesity Encouraged weight loss  Hypokalemia K 3.7 today, gave potassium 40 mEq OTO prior to discharge. Will need BMP in 1 week at OP follow visit.  _____________  Discharge Vitals Blood pressure 122/74, pulse 84, temperature 97.9 F (36.6 C), temperature source Oral, resp. rate 14, height _0  (1.6 m), weight 210 lb 6.4 oz (95.4 kg), SpO2 90 %.  Filed Weights   12/24/17 0500 12/25/17 0435  Weight: 213 lb 9.6 oz (96.9 kg) 210 lb 6.4 oz (95.4 kg)    Labs & Radiologic Studies    CBC Recent Labs    12/23/17 1801 12/24/17 0256  WBC 7.3 6.7  HGB 17.5* 17.4*  HCT 54.7* 53.3*  MCV 97.9 96.4  PLT 177 517   Basic Metabolic Panel Recent Labs    12/24/17 0256 12/24/17 0929 12/25/17 0511  NA 137 135 137  K 2.8* 3.7 3.7  CL 86* 87* 91*  CO2 34* 32 31  GLUCOSE 162* 192* 150*  BUN 18 20 25*  CREATININE 1.14* 1.01* 1.10*  CALCIUM 10.0 10.1 9.8  MG 1.8  --   --    Liver Function Tests No results for input(s): AST, ALT, ALKPHOS, BILITOT, PROT, ALBUMIN in the last 72 hours. No results for input(s): LIPASE, AMYLASE in the last 72  hours. Cardiac Enzymes No results for input(s): CKTOTAL, CKMB, CKMBINDEX, TROPONINI in the last 72 hours. BNP Invalid input(s): POCBNP D-Dimer No results for input(s): DDIMER in the last 72 hours. Hemoglobin A1C No results for input(s): HGBA1C in the last 72 hours. Fasting Lipid Panel No results for input(s): CHOL, HDL, LDLCALC, TRIG, CHOLHDL, LDLDIRECT in the last 72 hours. Thyroid Function Tests No results for input(s): TSH, T4TOTAL, T3FREE, THYROIDAB in the last 72 hours.  Invalid input(s): FREET3 _____________  Dg Chest 2 View  Result Date: 12/23/2017 CLINICAL DATA:  Lower extremity swelling EXAM: CHEST - 2 VIEW COMPARISON:  Chest CT 04/03/2011 FINDINGS: There is cardiomegaly with enlarged pulmonary arteries. Mild interstitial pulmonary edema. No pleural effusion or pneumothorax. IMPRESSION: Cardiomegaly, enlarged pulmonary arteries and mild interstitial pulmonary edema. Electronically Signed  By: Ulyses Jarred M.D.   On: 12/23/2017 19:10   Disposition   Pt is being discharged home today in good condition.  Follow-up Plans & Appointments    Follow-up Information    Bensimhon, Shaune Pascal, MD Follow up in 1 week(s).   Specialty:  Cardiology Contact information: [address] [place] [address] [phone]          Discharge Instructions    Diet - low sodium heart healthy   Complete by:  As directed    Increase activity slowly   Complete by:  As directed       Discharge Medications   Allergies as of 12/25/2017      Reactions   Bactrim    Face and eye swelling   Cinnamon    wheezing   Morphine And Related Other (See Comments)   Severe headache   Penicillins    "doesn't work" got a lot as a child that doesn't work. But no systemic reaction.   Sulfa Antibiotics Itching   Valdecoxib    Face swelling      Medication List    TAKE these medications   albuterol 108 (90 Base) MCG/ACT inhaler Commonly known as:  PROVENTIL HFA;VENTOLIN  HFA Inhale 2 puffs into the lungs every 4 (four) hours as needed for wheezing or shortness of breath.   aspirin 81 MG EC tablet Take 81 mg by mouth daily.   GOODY HEADACHE PO Take 1 Package by mouth daily as needed (Severe Headaches).   macitentan 10 MG tablet Commonly known as:  OPSUMIT Take 1 tablet (10 mg total) by mouth daily.   metolazone 5 MG tablet Commonly known as:  ZAROXOLYN Take 1 tablet (5 mg total) by mouth as needed for a weight gain of 3 lbs in one day or 5 lbs in one week. What changed:  additional instructions   potassium chloride SA 20 MEQ tablet Commonly known as:  K-DUR,KLOR-CON Take 3 tablets (60 mEq) in the morning and 2 tablets (40 mEq) in the evening. What changed:    how much to take  how to take this  when to take this  additional instructions   simethicone 80 MG chewable tablet Commonly known as:  MYLICON Chew 80 mg by mouth 2 (two) times daily with a meal.   spironolactone 25 MG tablet Commonly known as:  ALDACTONE Take 1 tablet (25 mg total) by mouth daily.   tadalafil (PAH) 20 MG tablet Commonly known as:  ADCIRCA Take 2 tablets (40 mg total) by mouth daily.   torsemide 20 MG tablet Commonly known as:  DEMADEX Take 2 tablets (40 mg total) by mouth 2 (two) times daily.   VITAMIN C PO Take 1 tablet by mouth daily.            Durable Medical Equipment  (From admission, onward)        Start     Ordered   12/25/17 [phone]  For home use only DME continuous positive airway pressure (CPAP)  Once    Question Answer Comment  Patient has OSA or probable OSA Yes   Is the patient currently using CPAP in the home Yes   Settings Autotitration   Signs and symptoms of probable OSA  (select all that apply) Snoring   CPAP supplies needed Mask, headgear, cushions, filters, heated tubing and water chamber      12/25/17 0833          Outstanding Labs/Studies   Call  for follow up appt (staff message sent), needs BMP in 1  week  Duration of Discharge Encounter   Greater than 30 minutes including physician time.  Signed, [place], [place] 12/25/2017, 9:16 AM  Agree with above. [place] for d/c home with f/u in [facility].   Glori Bickers, MD  2:39 PM

## 2017-12-25 NOTE — Discharge Instructions (Signed)
Cooking With Less Salt Cooking with less salt is one way to reduce the amount of sodium you get from food. Depending on your condition and overall health, your health care provider or diet and nutrition specialist (dietitian) may recommend that you reduce your sodium intake. Most people should have less than 2,300 milligrams (mg) of sodium each day. If you have high blood pressure (hypertension), you may need to limit your sodium to 1,500 mg each day. Follow the tips below to help reduce your sodium intake. What do I need to know about cooking with less salt? Shopping  Buy sodium-free or low-sodium products. Look for the following words on food labels: ? Low-sodium. ? Sodium-free. ? Reduced-sodium. ? No salt added. ? Unsalted.  Buy fresh or frozen vegetables. Avoid canned vegetables.  Avoid buying meats or protein foods that have been injected with broth or saline solution.  Avoid cured or smoked meats, such as hot dogs, bacon, salami, ham, and bologna. Reading food labels  Check the food label before buying or using packaged ingredients.  Look for products with no more than 140 mg of sodium in one serving.  Do not choose foods with salt as one of the first three ingredients on the ingredients list. If salt is one of the first three ingredients, it usually means the item is high in sodium, because ingredients are listed in order of amount in the food item. Cooking  Use herbs, seasonings without salt, and spices as substitutes for salt in foods.  Use sodium-free baking soda when baking.  Grill, braise, or roast foods to add flavor with less salt.  Avoid adding salt to pasta, rice, or hot cereals while cooking.  Drain and rinse canned vegetables before use.  Avoid adding salt when cooking sweets and desserts.  Cook with low-sodium ingredients. What are some salt alternatives? The following are herbs, seasonings, and spices that can be used instead of salt to give taste to your  food. Herbs should be fresh or dried. Do not choose packaged mixes. Next to the name of the herb, spice, or seasoning are some examples of foods you can pair it with. Herbs  Bay leaves - Soups, meat and vegetable dishes, and spaghetti sauce.  Basil - [employer], soups, pasta, and fish dishes.  Cilantro - Meat, poultry, and vegetable dishes.  Chili powder - Marinades and Mexican dishes.  Chives - Salad dressings and potato dishes.  Cumin - Mexican dishes, couscous, and meat dishes.  Dill - Fish dishes, sauces, and salads.  Fennel - Meat and vegetable dishes, breads, and cookies.  Garlic (do not use garlic salt) - [place] dishes, meat dishes, salad dressings, and sauces.  Marjoram - Soups, potato dishes, and meat dishes.  Oregano - Pizza and spaghetti sauce.  Parsley - Salads, soups, pasta, and meat dishes.  Rosemary - [place] dishes, salad dressings, soups, and red meats.  Saffron - Fish dishes, pasta, and some poultry dishes.  Sage - Stuffings and sauces.  Tarragon - Fish and [employer].  Thyme - Stuffing, meat, and fish dishes. Seasonings  Lemon juice - Fish dishes, poultry dishes, vegetables, and salads.  Vinegar - Salad dressings, vegetables, and fish dishes. Spices  Cinnamon - Sweet dishes, such as cakes, cookies, and puddings.  Cloves - Gingerbread, puddings, and marinades for meats.  Curry - Vegetable dishes, fish and poultry dishes, and stir-fry dishes.  Ginger - Vegetables dishes, fish dishes, and stir-fry dishes.  Nutmeg - Pasta, vegetables, poultry, fish dishes, and custard. What  are some low-sodium ingredients and foods?  Fresh or frozen fruits and vegetables with no sauce added.  Fresh or frozen whole meats, poultry, and fish with no sauce added.  Eggs.  Noodles, pasta, quinoa, rice.  Shredded or puffed wheat or puffed rice.  Regular or quick oats.  Milk, yogurt, hard cheeses, and low-sodium cheeses. Good cheese choices include  Swiss, [place]. Always check the label for the serving size and sodium content.  Unsalted butter or margarine.  Unsalted nuts.  Sherbet or ice cream (keep to  cup per serving).  Homemade pudding.  Sodium-free baking soda and baking powder. This is not a complete list of low-sodium ingredients and foods. Contact your dietitian for more options. Summary  Cooking with less salt is one way to reduce the amount of sodium that you get from food.  Buy sodium-free or low-sodium products.  Check the food label before using or buying packaged ingredients.  Use herbs, seasonings without salt, and spices as substitutes for salt in foods. This information is not intended to replace advice given to you by your health care provider. Make sure you discuss any questions you have with your health care provider. Document Released: 08/23/2005 Document Revised: 08/31/2016 Document Reviewed: 08/31/2016 Elsevier Interactive Patient Education  2017 [place].   Heart Failure Heart failure means your heart has trouble pumping blood. This makes it hard for your body to work well. Heart failure is usually a long-term (chronic) condition. You must take good care of yourself and follow your doctor's treatment plan. Follow these instructions at home:  Take your heart medicine as told by your doctor. ? Do not stop taking medicine unless your doctor tells you to. ? Do not skip any dose of medicine. ? Refill your medicines before they run out. ? Take other medicines only as told by your doctor or pharmacist.  Stay active if told by your doctor. The elderly and people with severe heart failure should talk with a doctor about physical activity.  Eat heart-healthy foods. Choose foods that are without trans fat and are low in saturated fat, cholesterol, and salt (sodium). This includes fresh or frozen fruits and vegetables, fish, lean meats, fat-free or low-fat dairy foods, whole grains,  and high-fiber foods. Lentils and dried peas and beans (legumes) are also good choices.  Limit salt if told by your doctor.  Cook in a healthy way. Roast, grill, broil, bake, poach, steam, or stir-fry foods.  Limit fluids as told by your doctor.  Weigh yourself every morning. Do this after you pee (urinate) and before you eat breakfast. Write down your weight to give to your doctor.  Take your blood pressure and write it down if your doctor tells you to.  Ask your doctor how to check your pulse. Check your pulse as told.  Lose weight if told by your doctor.  Stop smoking or chewing tobacco. Do not use gum or patches that help you quit without your doctor's approval.  Schedule and go to doctor visits as told.  Nonpregnant women should have no more than 1 drink a day. Men should have no more than 2 drinks a day. Talk to your doctor about drinking alcohol.  Stop illegal drug use.  Stay current with shots (immunizations).  Manage your health conditions as told by your doctor.  Learn to manage your stress.  Rest when you are tired.  If it is really hot outside: ? Avoid intense activities. ? Use air conditioning or fans,  or get in a cooler place. ? Avoid caffeine and alcohol. ? Wear loose-fitting, lightweight, and light-colored clothing.  If it is really cold outside: ? Avoid intense activities. ? Layer your clothing. ? Wear mittens or gloves, a hat, and a scarf when going outside. ? Avoid alcohol.  Learn about heart failure and get support as needed.  Get help to maintain or improve your quality of life and your ability to care for yourself as needed. Contact a doctor if:  You gain weight quickly.  You are more short of breath than usual.  You cannot do your normal activities.  You tire easily.  You cough more than normal, especially with activity.  You have any or more puffiness (swelling) in areas such as your hands, feet, ankles, or belly (abdomen).  You  cannot sleep because it is hard to breathe.  You feel like your heart is beating fast (palpitations).  You get dizzy or light-headed when you stand up. Get help right away if:  You have trouble breathing.  There is a change in mental status, such as becoming less alert or not being able to focus.  You have chest pain or discomfort.  You faint. This information is not intended to replace advice given to you by your health care provider. Make sure you discuss any questions you have with your health care provider. Document Released: 06/01/2008 Document Revised: 01/29/2016 Document Reviewed: 10/09/2012 Elsevier Interactive Patient Education  2017 [place].   Daily Weight Record It is important to weigh yourself daily. Keep this daily weight chart near your scale. Weigh yourself each morning at the same time. Weigh yourself without shoes, and wear the same amount of clothing each day. Compare today's weight to yesterday's weight. Bring this form with you to your follow-up appointments. Call your health care provider if you have concerns about your weight, including rapid weight gain or rapid weight loss. Date: ________ Weight: ____________________  Date: ________ Weight: ____________________ Date: ________ Weight: ____________________  Date: ________ Weight: ____________________ Date: ________ Weight: ____________________  Date: ________ Weight: ____________________ Date: ________ Weight: ____________________  Date: ________ Weight: ____________________ Date: ________ Weight: ____________________  Date: ________ Weight: ____________________ Date: ________ Weight: ____________________  Date: ________ Weight: ____________________ Date: ________ Weight: ____________________  Date: ________ Weight: ____________________ Date: ________ Weight: ____________________  Date: ________ Weight: ____________________ Date: ________ Weight: ____________________  Date: ________ Weight:  ____________________ Date: ________ Weight: ____________________  Date: ________ Weight: ____________________ Date: ________ Weight: ____________________  Date: ________ Weight: ____________________ Date: ________ Weight: ____________________  Date: ________ Weight: ____________________ Date: ________ Weight: ____________________  Date: ________ Weight: ____________________ Date: ________ Weight: ____________________  Date: ________ Weight: ____________________ Date: ________ Weight: ____________________  Date: ________ Weight: ____________________ Date: ________ Weight: ____________________  Date: ________ Weight: ____________________ Date: ________ Weight: ____________________  Date: ________ Weight: ____________________ Date: ________ Weight: ____________________  Date: ________ Weight: ____________________ Date: ________ Weight: ____________________  Date: ________ Weight: ____________________ Date: ________ Weight: ____________________  Date: ________ Weight: ____________________ Date: ________ Weight: ____________________  Date: ________ Weight: ____________________ Date: ________ Weight: ____________________  Date: ________ Weight: ____________________ Date: ________ Weight: ____________________  Date: ________ Weight: ____________________ Date: ________ Weight: ____________________  Date: ________ Weight: ____________________ Date: ________ Weight: ____________________  Date: ________ Weight: ____________________ This information is not intended to replace advice given to you by your health care provider. Make sure you discuss any questions you have with your health care provider. Document Released: 11/04/2006 Document Revised: 08/06/2016 Document Reviewed: 03/22/2014 Elsevier Interactive Patient Education  [employer].

## 2017-12-25 NOTE — Progress Notes (Signed)
Advanced Heart Failure Rounding Note   Subjective:    Diuresed another 3.5 pounds with IV lasix overnight. Feels better. Lying flat. Cretinine stable. Was able to use CPAP last night.   Objective:   Weight Range:  Vital Signs:   Temp:  [97.6 F (36.4 C)-98 F (36.7 C)] 97.9 F (36.6 C) (04/21 0439) Pulse Rate:  [84-91] 84 (04/21 0439) Resp:  [14] 14 (04/20 1504) BP: (111-122)/(52-74) 122/74 (04/21 0439) SpO2:  [88 %-91 %] 90 % (04/21 0439) Weight:  [95.4 kg (210 lb 6.4 oz)] 95.4 kg (210 lb 6.4 oz) (04/21 0435) Last BM Date: 12/23/17  Weight change: Filed Weights   12/24/17 0500 12/25/17 0435  Weight: 96.9 kg (213 lb 9.6 oz) 95.4 kg (210 lb 6.4 oz)    Intake/Output:   Intake/Output Summary (Last 24 hours) at 12/25/2017 0819 Last data filed at 12/25/2017 0435 Gross per 24 hour  Intake 750 ml  Output 2850 ml  Net -2100 ml     Physical Exam: General:  Lying flat in bed  No resp difficulty HEENT: normal Neck: supple.JVP 7-8. Carotids 2+ bilat; no bruits. No lymphadenopathy or thryomegaly appreciated. Cor: PMI nondisplaced. Regular rate & rhythm. 2/6 TR Lungs: clear bt markedly reduced throughout  Abdomen: obese soft, nontender, nondistended. No hepatosplenomegaly. No bruits or masses. Good bowel sounds. Extremities: no cyanosis, clubbing, rash, edema Neuro: alert & orientedx3, cranial nerves grossly intact. moves all 4 extremities w/o difficulty. Affect pleasant   Telemetry: NSR 80-90s Personally reviewed   Labs: Basic Metabolic Panel: Recent Labs  Lab 12/21/17 1527 12/23/17 1801 12/24/17 0256 12/24/17 0929 12/25/17 0511  NA 139 137 137 135 137  K 3.8 3.3* 2.8* 3.7 3.7  CL 101 87* 86* 87* 91*  CO2 28 32 34* 32 31  GLUCOSE 112* 92 162* 192* 150*  BUN _0 25*  CREATININE 0.85 1.23* 1.14* 1.01* 1.10*  CALCIUM 9.2 10.1 10.0 10.1 9.8  MG  --   --  1.8  --   --     Liver Function Tests: No results for input(s): AST, ALT, ALKPHOS, BILITOT,  PROT, ALBUMIN in the last 168 hours. No results for input(s): LIPASE, AMYLASE in the last 168 hours. No results for input(s): AMMONIA in the last 168 hours.  CBC: Recent Labs  Lab 12/23/17 1801 12/24/17 0256  WBC 7.3 6.7  HGB 17.5* 17.4*  HCT 54.7* 53.3*  MCV 97.9 96.4  PLT 177 178    Cardiac Enzymes: No results for input(s): CKTOTAL, CKMB, CKMBINDEX, TROPONINI in the last 168 hours.  BNP: BNP (last 3 results) Recent Labs    12/23/17 1801  BNP 563.1*    ProBNP (last 3 results) No results for input(s): PROBNP in the last 8760 hours.    Other results:  Imaging: Dg Chest 2 View  Result Date: 12/23/2017 CLINICAL DATA:  Lower extremity swelling EXAM: CHEST - 2 VIEW COMPARISON:  Chest CT 04/03/2011 FINDINGS: There is cardiomegaly with enlarged pulmonary arteries. Mild interstitial pulmonary edema. No pleural effusion or pneumothorax. IMPRESSION: Cardiomegaly, enlarged pulmonary arteries and mild interstitial pulmonary edema. Electronically Signed   By: Ulyses Jarred M.D.   On: 12/23/2017 19:10     Medications:     Scheduled Medications: . aspirin EC  81 mg Oral Daily  . heparin  5,000 Units Subcutaneous Q8H  . mouth rinse  15 mL Mouth Rinse BID  . potassium chloride  40 mEq Oral BID  . sodium chloride flush  3 mL  Intravenous Q12H  . spironolactone  25 mg Oral Daily    Infusions: . sodium chloride      PRN Medications: sodium chloride, acetaminophen, albuterol, sodium chloride flush   Assessment:   Mrs Gross is a 62 year old female with history of obesity, severe COPD, mixed PAH and diastolic dysfunction. Admitted with volume overload     Plan/Discussion:    1) Acute on chronic diastolic HF (R>>L HF): - Volume status much improved. Can go home today. Increase torsemide to 40 bid. Increase potassium to 60 in am and 40 in pm - take metolazone 2.5 prn if weight hits 216 pounds 2) PAH: Mixed.  She has severe COPD with a component of group 3 PAH but also  Group 1 PAH with significant improvement in PA pressures and symptoms with vasodilators.   - Remains NYHA IIIIB symptoms on macitentan and Adcirca.  - Recent RHC with severe PAH (PAs > 100) but given severity of lung disease FEV1 0.69L Will not add selexapeg give risk of shunting.  - If unable to control edema well may need to stop macitentan - Continue O2 - Stressed need for CPAP 3) Chronic hypoxic respiratory failure due to COPD on home O2: Continuous 2 liters [place].  - PFTs very severe. Follows with Pulmonary 4) OSA -  Again encouraged to use CPAP. She says she likes the [facility] machine much better than the one she had. Will try to get her a new one as an outpatient.   - She has severe polycythemia likely related to nocturnal hypoxemia 5) Morbid obesity - Encouraged weight loss.  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 - Saw Dr Benjamine Mola and [facility]. Tumor felt to be small. Just watchful waiting.  7) Hypokalemia - K 3.7 today. Give one dose of kcl 40 prior to d/c   [facility] for d/c today. Can cancel appt in HF clinic this Tuesday. Please arrange f/u for the week after.  Length of Stay: 2   Glori Bickers MD 12/25/2017, 8:19 AM  Advanced Heart Failure Team Pager [phone] (M-F; 7a - 4p)  Please contact [facility] for night-coverage after hours (4p -7a ) and weekends on [email or web address]

## 2017-12-26 ENCOUNTER — Telehealth: Payer: Self-pay | Admitting: *Deleted

## 2017-12-26 ENCOUNTER — Other Ambulatory Visit: Payer: Self-pay

## 2017-12-26 ENCOUNTER — Other Ambulatory Visit (HOSPITAL_COMMUNITY): Payer: Self-pay | Admitting: *Deleted

## 2017-12-26 DIAGNOSIS — G4733 Obstructive sleep apnea (adult) (pediatric): Secondary | ICD-10-CM

## 2017-12-26 NOTE — Telephone Encounter (Signed)
-----  Message from [place], [place] sent at 12/26/2017 12:45 PM EDT ----- Regarding: sleep sutdy  Order placed for sleep study   ----- Message ----- From: Lauralee Evener, CMA Sent: 12/26/2017  12:38 PM To: Freada Bergeron, CMA, Teressa Senter, RN  Gae Bon OK to schedule sleep study patient has plain Medicare no Pre cert is needed. ----- Message ----- From: Teressa Senter, RN Sent: 12/26/2017   7:52 AM To: Freada Bergeron, CMA, Cv Div Sleep Studies    ----- Message ----- From: Ledora Bottcher, PA Sent: 12/25/2017   1:51 PM To: Rebeca Alert [facility] Triage  Pt needs a repeat sleep study. Can you please forward to the right person?  Thank you!!! Angie

## 2017-12-26 NOTE — Progress Notes (Signed)
Order placed for Sleep study per Fabian Sharp, PA

## 2017-12-26 NOTE — Telephone Encounter (Signed)
Staff message sent to Alexis [name] ok to schedule sleep study. Patient has Medicare. No pre authorization is needed.

## 2017-12-26 NOTE — Telephone Encounter (Signed)
Patient is scheduled for lab study on 01/17/18. Patient understands her sleep study will be done at [facility] sleep lab. Patient understands she will receive a sleep packet in a week or so. Patient understands to call if she does not receive the sleep packet in a timely manner. Patient agrees with treatment and thanked me for call.

## 2017-12-27 ENCOUNTER — Telehealth: Payer: Self-pay | Admitting: Physician Assistant

## 2017-12-27 NOTE — Telephone Encounter (Signed)
New message:     Pt calling with questions about the sleep study.

## 2017-12-27 NOTE — Telephone Encounter (Signed)
Patient has rescheduled her sleep study to a day sleep study on 01/31/18 at 7 am.

## 2017-12-27 NOTE — Telephone Encounter (Signed)
Patient has rescheduled her sleep study to 01/31/18 at 7 am

## 2017-12-28 ENCOUNTER — Encounter (HOSPITAL_COMMUNITY): Payer: Medicare Other

## 2018-01-06 ENCOUNTER — Encounter (HOSPITAL_COMMUNITY): Payer: Medicare Other

## 2018-01-17 ENCOUNTER — Encounter (HOSPITAL_BASED_OUTPATIENT_CLINIC_OR_DEPARTMENT_OTHER): Payer: Medicare Other

## 2018-01-24 ENCOUNTER — Encounter (HOSPITAL_BASED_OUTPATIENT_CLINIC_OR_DEPARTMENT_OTHER): Payer: Medicare Other

## 2018-01-31 ENCOUNTER — Encounter (HOSPITAL_BASED_OUTPATIENT_CLINIC_OR_DEPARTMENT_OTHER): Payer: Medicare Other

## 2018-03-03 DIAGNOSIS — M7989 Other specified soft tissue disorders: Secondary | ICD-10-CM | POA: Diagnosis not present

## 2018-03-06 DIAGNOSIS — R0602 Shortness of breath: Secondary | ICD-10-CM | POA: Diagnosis not present

## 2018-03-06 DIAGNOSIS — I272 Pulmonary hypertension, unspecified: Secondary | ICD-10-CM | POA: Diagnosis not present

## 2018-03-06 DIAGNOSIS — I2781 Cor pulmonale (chronic): Secondary | ICD-10-CM | POA: Diagnosis not present

## 2018-03-06 DIAGNOSIS — L03119 Cellulitis of unspecified part of limb: Secondary | ICD-10-CM | POA: Diagnosis not present

## 2018-03-06 DIAGNOSIS — G4733 Obstructive sleep apnea (adult) (pediatric): Secondary | ICD-10-CM | POA: Diagnosis not present

## 2018-03-06 DIAGNOSIS — J449 Chronic obstructive pulmonary disease, unspecified: Secondary | ICD-10-CM | POA: Diagnosis not present

## 2018-03-06 DIAGNOSIS — E876 Hypokalemia: Secondary | ICD-10-CM | POA: Diagnosis not present

## 2018-03-06 DIAGNOSIS — I509 Heart failure, unspecified: Secondary | ICD-10-CM | POA: Diagnosis not present

## 2018-03-06 DIAGNOSIS — R609 Edema, unspecified: Secondary | ICD-10-CM | POA: Diagnosis not present

## 2018-04-24 DIAGNOSIS — H2589 Other age-related cataract: Secondary | ICD-10-CM | POA: Diagnosis not present

## 2018-04-26 ENCOUNTER — Telehealth: Payer: Self-pay | Admitting: Pulmonary Disease

## 2018-04-26 NOTE — Telephone Encounter (Signed)
Will route to Cherina to follow up

## 2018-04-26 NOTE — Telephone Encounter (Signed)
Patient returned phone call; appointment scheduled with NP Mack on 04/28/18 at 11:30am

## 2018-04-26 NOTE — Telephone Encounter (Signed)
Form has been placed in RA's folder.

## 2018-04-26 NOTE — Telephone Encounter (Signed)
Per RA, patient will need a follow up appt before clearance.   Left message for patient to call back to get scheduled with a NP.

## 2018-04-26 NOTE — Telephone Encounter (Signed)
Noted.

## 2018-04-27 NOTE — Progress Notes (Signed)
_0  ID: Alexis [name], female    DOB: 1956-05-15, 62 y.o.   MRN: [id number]  Chief Complaint  Patient presents with  . Medical Clearance    needs surgical clearance for cataract surgery     Referring provider: Joseph Art, MD  HPI: 63 year old female former smoker (60-pack-year smoking history) followed in our office for obstructive sleep apnea (managed on CPAP), pulmonary hypertension, chronic respiratory failure (2 L via [place] 24/7)  PMH:  Smoker/ Smoking History: Former smoker.  Patient quit in 2012.  60-pack-year smoking history Maintenance: Breo Ellipta Pt of: Dr. Elsworth Soho  Recent [facility] Encounters:   06/06/2017- visit-[name] 62 year old heavy ex-smoker presents for evaluation of sleep disordered breathing.  Was diagnosed with severe pulmonary hypertension in 2012 and since then has been maintained on 2 L oxygen patient had right heart cath and was felt to have WHO group 2 and group 3 pulmonary hypertension.  She had undergone evaluation at [facility].  Former smoker.  She quit in 2012.  60-pack-year history. Plan: trial of Breo Ellipta, consider pulmonary rehab, continue CPAP - if patient fails this will start BiPAP therapy, flu shot today, follow-up in 2 months    04/28/2018  - Visit   Patient presents the office visit today for surgical clearance for cataract surgery.  Patient reports that she is no longer taking her OPSUMIT, Aldactone, or Adcirca.  Patient reports she stopped those partially due to cost and also because she did not feel they are working.  Per patient, Dr. Edwena Bunde office is aware.  Last office note in April/2019 as well as hospital discharge note reports that patient should still be on these medications.  Last office note from our practice here was October/2018 for patient to continue taking these medications.  Patient using 2 L continuous from simply go O2 with exertion.  Patient reports she is wearing her simply go 4 L pulsed at night.   Patient has not been adherent to using her CPAP.  Patient reports she does not have a room in her trailer for her CPAP.  Patient reports that she was under the impression that increasing her oxygen at night via the concentrator was the same as wearing her CPAP.  Patient also reports that she is been having increased lower extremity swelling and some weeping leg wounds.  She was seen by cardiology in [facility] where they explained to her that this is lower extremity swelling.  They ruled out DVT.   [place] Postperative Pulmonary Risk Score Comment Score  Type of surgery - abd ao aneurysm (27), thoracic (21), neurosurgery / upper abdominal / vascular (21), neck (11) Cataract  0  Emergency Surgery - (11) no 0  ALbumin < 3 or poor nutritional state - (9) - -  BUN > 30 -  (8) - -  Partial or completely dependent functional status - (7) yes 7  COPD -  (6) yes 6  Age - 60 to 45 (4), > 70  (6) 63 4  TOTAL  17  Risk Stratifcation scores  - < 10, 11-19, 20-27, 28-40, >40 moderate 17    CANET Postperative Pulmonary Risk Score Comment Score  Age - <50 (0), 50-80 (3), >80 (16) 63 3  Preoperative pulse ox - >96 (0), 91-95 (8), <90 (24) 93 8  Respiratory infection in last month - Yes (17) no 0  Preoperative anemia - < 10gm% - Yes (11) - -  Surgical incision - Upper abdominal (15), Thoracic (24) no 0  Duration of surgery - <2h (0), 2-3h (16), >3h (23) 2 hour 0  Emergency Surgery - Yes (8) no 0  TOTAL  11  Risk Stratification - Low (<26), Intermediate (26-44), High (>45) low 11   Patient is moderate to high risk for pulmonary complications with surgery.  Patient is okay to proceed forward with cataract surgery.  This is d/t patient's nonadherence to pulmonary hypertension medications, patient's non-adherence to medical plan of care, patient's noncompliance with CPAP use.    Allergies  Allergen Reactions  . Bactrim     Face and eye swelling  . Cinnamon     wheezing  . Morphine And  Related Other (See Comments)    Severe headache  . Penicillins     "doesn't work" got a lot as a child that doesn't work. But no systemic reaction.  . Sulfa Antibiotics Itching  . Valdecoxib     Face swelling    Immunization History  Administered Date(s) Administered  . Influenza,inj,Quad PF,6+ Mos 07/07/2013, 06/06/2014, 06/06/2017  . Pneumococcal Conjugate-13 02/07/2012   >>> Patient will need flu vaccine when available  Past Medical History:  Diagnosis Date  . Body mass index 40.0-44.9, adult ([place])   . Cardiomegaly    Normal LV systolic function ejection fraction 55-60% with moderate decrease in RV function.  . Chronic obstructive asthma with exacerbation (HCC)    CT scan was a pattern but no evidence of pulmonary embolus and., Negative VQ scan July 2012 pulmonary function test FVC 1.17 L per minute 37% of predicted, FEV1 1 L 40% of predicted. DLCO 61% of predicted no response to bronchodilators negative HIV serology, ANA in scleroderma.  . Edema   . Hepatomegaly   . Obesity, unspecified   . Obstructive sleep apnea    BiPAP initiated July 2012  . Other and unspecified hyperlipidemia   . Other chronic pulmonary heart diseases   . Other diseases of lung, not elsewhere classified   . Pulmonary arterial hypertension (HCC)     PA pressure 112/54 mmHg the mean of 74 mmHg, right atrial pressure mean 25 mmHg. Pulmonary vascular resistance 13 with units. Cardiac output 5.8 L. Normal coronary arteries with normal LV function July 2012   . Snores   . SOB (shortness of breath) on exertion   . Tobacco use disorder     Tobacco History: Social History   Tobacco Use  Smoking Status Former Smoker  . Packs/day: 1.00  . Years: 40.00  . Pack years: 40.00  . Types: Cigarettes  . Last attempt to quit: 03/30/2011  . Years since quitting: 7.0  Smokeless Tobacco Never Used   Counseling given: Not Answered Continue not smoking  Outpatient Encounter Medications as of 04/28/2018    Medication Sig  . albuterol (PROVENTIL HFA;VENTOLIN HFA) 108 (90 BASE) MCG/ACT inhaler Inhale 2 puffs into the lungs every 4 (four) hours as needed for wheezing or shortness of breath.  . Aspirin-Acetaminophen-Caffeine (GOODY HEADACHE PO) Take 1 Package by mouth daily as needed (Severe Headaches).   . metolazone (ZAROXOLYN) 5 MG tablet Take 1 tablet (5 mg total) by mouth as needed for a weight gain of 3 lbs in one day or 5 lbs in one week.  . potassium chloride SA (K-DUR,KLOR-CON) 20 MEQ tablet Take 3 tablets (60 mEq) in the morning and 2 tablets (40 mEq) in the evening.  . torsemide (DEMADEX) 20 MG tablet Take 2 tablets (40 mg total) by mouth 2 (two) times daily.  . Ascorbic Acid (VITAMIN C PO)  Take 1 tablet by mouth daily.  Marland Kitchen aspirin 81 MG EC tablet Take 81 mg by mouth daily.    . macitentan (OPSUMIT) 10 MG tablet Take 1 tablet (10 mg total) by mouth daily. (Patient not taking: Reported on 04/28/2018)  . simethicone (MYLICON) 80 MG chewable tablet Chew 80 mg by mouth 2 (two) times daily with a meal.  . spironolactone (ALDACTONE) 25 MG tablet Take 1 tablet (25 mg total) by mouth daily. (Patient not taking: Reported on 04/28/2018)  . tadalafil, PAH, (ADCIRCA) 20 MG tablet Take 2 tablets (40 mg total) by mouth daily. (Patient not taking: Reported on 04/28/2018)   No facility-administered encounter medications on file as of 04/28/2018.      Review of Systems  Review of Systems  Constitutional: Positive for fatigue. Negative for chills, fever and unexpected weight change.  HENT: Negative for congestion, ear pain, postnasal drip and rhinorrhea.   Eyes: Positive for redness.       Awaiting cataract surgery   Respiratory: Positive for shortness of breath. Negative for cough, chest tightness and wheezing.        Denies hemoptysis  Cardiovascular: Positive for leg swelling (healing weeping leg wound). Negative for chest pain and palpitations.  Gastrointestinal: Negative for blood in stool, diarrhea,  nausea and vomiting.  Genitourinary: Negative for dysuria, frequency and urgency.  Musculoskeletal: Negative for arthralgias.  Skin: Positive for wound (right lower extremity leg wound, weeping). Negative for color change.  Allergic/Immunologic: Negative for environmental allergies and food allergies.  Neurological: Negative for dizziness, light-headedness and headaches.  Psychiatric/Behavioral: Negative for dysphoric mood. The patient is not nervous/anxious.     Physical Exam  BP 110/72   Pulse 84   Ht _0  (1.575 m)   Wt 219 lb 9.6 oz (99.6 kg)   SpO2 93%   BMI 40.17 kg/m   Wt Readings from Last 5 Encounters:  04/28/18 219 lb 9.6 oz (99.6 kg)  12/25/17 210 lb 6.4 oz (95.4 kg)  12/21/17 232 lb 8 oz (105.5 kg)  06/06/17 219 lb (99.3 kg)  02/24/17 227 lb 12.8 oz (103.3 kg)   2L via [place] - Simply Go   Physical Exam  Constitutional: She is oriented to person, place, and time and well-developed, well-nourished, and in no distress. No distress.  HENT:  Head: Normocephalic and atraumatic.  Right Ear: Hearing, tympanic membrane, external ear and ear canal normal.  Left Ear: Hearing, tympanic membrane, external ear and ear canal normal.  Nose: Right sinus exhibits no maxillary sinus tenderness and no frontal sinus tenderness. Left sinus exhibits frontal sinus tenderness (slight). Left sinus exhibits no maxillary sinus tenderness.  Mouth/Throat: Uvula is midline and oropharynx is clear and moist. No oropharyngeal exudate.  Eyes: Pupils are equal, round, and reactive to light.  Neck: Normal range of motion. Neck supple. No JVD present.  Cardiovascular: Normal rate, regular rhythm and normal heart sounds.  Pulmonary/Chest: Effort normal. No accessory muscle usage. No respiratory distress. She has no decreased breath sounds. She has no wheezes. She has no rhonchi. She has rales (slight in RLL).  Abdominal: Soft. Bowel sounds are normal. There is tenderness (slight tenderness throughout  abdomen).  Musculoskeletal: Normal range of motion. She exhibits edema (2+ LE bilaterally ).  Lymphadenopathy:    She has no cervical adenopathy.  Neurological: She is alert and oriented to person, place, and time. Gait normal.  Skin: Skin is warm and dry. She is not diaphoretic. No erythema.  Psychiatric: Mood, memory, affect and judgment  normal.  Nursing note and vitals reviewed.    Lab Results:  CBC    Component Value Date/Time   WBC 6.7 12/24/2017 0256   RBC 5.53 (H) 12/24/2017 0256   HGB 17.4 (H) 12/24/2017 0256   HCT 53.3 (H) 12/24/2017 0256   PLT 178 12/24/2017 0256   MCV 96.4 12/24/2017 0256   MCH 31.5 12/24/2017 0256   MCHC 32.6 12/24/2017 0256   RDW 15.2 12/24/2017 0256   LYMPHSABS 0.9 08/05/2015 0830   MONOABS 0.6 08/05/2015 0830   EOSABS 0.1 08/05/2015 0830   BASOSABS 0.0 08/05/2015 0830    BMET    Component Value Date/Time   NA 137 12/25/2017 0511   K 3.7 12/25/2017 0511   CL 91 (L) 12/25/2017 0511   CO2 31 12/25/2017 0511   GLUCOSE 150 (H) 12/25/2017 0511   BUN 25 (H) 12/25/2017 0511   CREATININE 1.10 (H) 12/25/2017 0511   CALCIUM 9.8 12/25/2017 0511   GFRNONAA 52 (L) 12/25/2017 0511   GFRAA >60 12/25/2017 0511    BNP    Component Value Date/Time   BNP 563.1 (H) 12/23/2017 1801    ProBNP    Component Value Date/Time   PROBNP 489.0 (H) 06/17/2014 1240    Imaging: No results found.     Tests:   NPSG 10/2013 HF 5/hour, RDI 49/hour, lowest desaturation 91% on 2 L oxygen  PFTs 10/2016 ratio 68, FEV1 0.67, 29%, FVC 0.98-32%, DLCO 52%  12/23/2017-chest x-ray- cardiomegaly, enlarged pulmonary arteries and mild interstitial pulmonary edema 08/31/2016-echocardiogram-LV ejection fraction 60 to 65%, mild LVH consider right heart cath for direct measurement of pulmonary pressure 09/15/2016-right heart cath-Bensimhon- severe PAH with significant worsening since last right heart cath, normal cardiac output with evidence of RV strain RA = 13 RV =  106/20 PA = 108/52 (65) PCW = 12 Fick cardiac output/index = 5.7/2.8 PVR = 11.0 WU Ao sat = 93% PA sat = 71%, 72% SVC sat = 65% RA sat = 75    Chart Review:  Patient was hospitalized in April/2019 Patient seen last by Dr. Wylie Hail office in April/2019    Assessment & Plan:   Pleasant 62 year old patient seen in office visit today.  I believe it is okay for patient to proceed forward with cataract surgery.    I explained to patient that further surgeries she would be a moderate to high risk for pulmonary complications due to her pulmonary hypertension, chronic respiratory failure, obstructive sleep apnea which she is not adherent to using her CPAP. She reports that she understands.  We will continue patient on Breo Ellipta.  I emphasized the importance the patient needs to follow-up with Dr. Edwena Bunde office for cardiology as well as with our office and Dr. Elsworth Soho more often.  I will have patient come back to see Dr. Elsworth Soho for his first available.  Patient states that she agrees.  She will do better with follow-up.  Health care maintenance Patient will need flu vaccine when available  Morbid obesity ([place]) Continue to work towards healthy weight  Obstructive sleep apnea We recommend that you continue using your CPAP daily >>>Keep up the hard work using your device >>> Goal should be wearing this for the entire night that you are sleeping, at least 4 to 6 hours  Remember:  . Do not drive or operate heavy machinery if tired or drowsy.  . Please notify the supply company and office if you are unable to use your device regularly due to missing supplies or machine  being broken.  . Work on maintaining a healthy weight and following your recommended nutrition plan  . Maintain proper daily exercise and movement  . Maintaining proper use of your device can also help improve management of other chronic illnesses such as: Blood pressure, blood sugars, and weight management.   BiPAP/  CPAP Cleaning:  >>>Clean weekly, with Dawn soap, and bottle brush.  Set up to air dry.    PAH (pulmonary artery hypertension) (HCC) Follow-up with Dr. Edwena Bunde office Continue follow-up with our office   We recommend for you to continue taking your Adcirca We recommend for you continue taking your OPSUMIT >>> Patient declined both of these today in office visit     Chronic respiratory failure Continue oxygen therapy as prescribed Follow-up with our office with Dr. Elsworth Soho  COPD (chronic obstructive pulmonary disease) with emphysema ([place]) Continue Breo Ellipta Follow-up with our office with Dr. Elsworth Soho first available     Lauraine Rinne, NP 04/28/2018

## 2018-04-28 ENCOUNTER — Ambulatory Visit (INDEPENDENT_AMBULATORY_CARE_PROVIDER_SITE_OTHER): Payer: Medicare Other | Admitting: Pulmonary Disease

## 2018-04-28 ENCOUNTER — Encounter: Payer: Self-pay | Admitting: Pulmonary Disease

## 2018-04-28 DIAGNOSIS — J439 Emphysema, unspecified: Secondary | ICD-10-CM | POA: Diagnosis not present

## 2018-04-28 DIAGNOSIS — Z0000 Encounter for general adult medical examination without abnormal findings: Secondary | ICD-10-CM | POA: Diagnosis not present

## 2018-04-28 DIAGNOSIS — G4733 Obstructive sleep apnea (adult) (pediatric): Secondary | ICD-10-CM

## 2018-04-28 DIAGNOSIS — I2721 Secondary pulmonary arterial hypertension: Secondary | ICD-10-CM | POA: Diagnosis not present

## 2018-04-28 DIAGNOSIS — J9611 Chronic respiratory failure with hypoxia: Secondary | ICD-10-CM | POA: Diagnosis not present

## 2018-04-28 NOTE — Assessment & Plan Note (Signed)
Continue oxygen therapy as prescribed Follow-up with our office with Dr. Elsworth Soho

## 2018-04-28 NOTE — Assessment & Plan Note (Signed)

## 2018-04-28 NOTE — Assessment & Plan Note (Signed)
Patient will need flu vaccine when available

## 2018-04-28 NOTE — Patient Instructions (Signed)
Patient has moderate to high risk for surgery okay to proceed >>>Explained to patient she needs of early ambulation follow-up with our office if shortness of breath or respiratory concerns occur  Patient needs to contact Dr. Edwena Bunde office to schedule an appointment >>> Encouraged patient to weigh herself daily >>>Encouraged patient to follow low-sodium diet >>> Emphasized the importance of patient following up with Dr. Clayborne Dana office with worsening lower extremity swelling  We will bring patient back to see Dr. Elsworth Soho his first available >>> Explained to patient that she needs to follow-up with our office if she is having difficulties using her CPAP, or increased shortness of breath  Encouraged patient to resume CPAP use  Patient to have flu vaccine when available    Please contact the office if your symptoms worsen or you have concerns that you are not improving.   Thank you for choosing [facility] for your healthcare, and for allowing [place] to partner with you on your healthcare journey. I am thankful to be able to provide care to you today.   Wyn Quaker FNP-C    Low-Sodium Eating Plan Sodium, which is an element that makes up salt, helps you maintain a healthy balance of fluids in your body. Too much sodium can increase your blood pressure and cause fluid and waste to be held in your body. Your health care provider or dietitian may recommend following this plan if you have high blood pressure (hypertension), kidney disease, liver disease, or heart failure. Eating less sodium can help lower your blood pressure, reduce swelling, and protect your heart, liver, and kidneys. What are tips for following this plan? General guidelines  Most people on this plan should limit their sodium intake to 1,500-2,000 mg (milligrams) of sodium each day. Reading food labels  The Nutrition Facts label lists the amount of sodium in one serving of the food. If you eat more than one  serving, you must multiply the listed amount of sodium by the number of servings.  Choose foods with less than 140 mg of sodium per serving.  Avoid foods with 300 mg of sodium or more per serving. Shopping  Look for lower-sodium products, often labeled as "low-sodium" or "no salt added."  Always check the sodium content even if foods are labeled as "unsalted" or "no salt added".  Buy fresh foods. ? Avoid canned foods and premade or frozen meals. ? Avoid canned, cured, or processed meats  Buy breads that have less than 80 mg of sodium per slice. Cooking  Eat more home-cooked food and less restaurant, buffet, and fast food.  Avoid adding salt when cooking. Use salt-free seasonings or herbs instead of table salt or sea salt. Check with your health care provider or pharmacist before using salt substitutes.  Cook with plant-based oils, such as canola, sunflower, or olive oil. Meal planning  When eating at a restaurant, ask that your food be prepared with less salt or no salt, if possible.  Avoid foods that contain MSG (monosodium glutamate). MSG is sometimes added to [place] food, bouillon, and some canned foods. What foods are recommended? The items listed may not be a complete list. Talk with your dietitian about what dietary choices are best for you. Grains Low-sodium cereals, including oats, puffed wheat and rice, and shredded wheat. Low-sodium crackers. Unsalted rice. Unsalted pasta. Low-sodium bread. Whole-grain breads and whole-grain pasta. Vegetables Fresh or frozen vegetables. "No salt added" canned vegetables. "No salt added" tomato sauce and paste. Low-sodium or reduced-sodium tomato and  vegetable juice. Fruits Fresh, frozen, or canned fruit. Fruit juice. Meats and other protein foods Fresh or frozen (no salt added) meat, poultry, seafood, and fish. Low-sodium canned tuna and salmon. Unsalted nuts. Dried peas, beans, and lentils without added salt. Unsalted canned beans.  Eggs. Unsalted nut butters. Dairy Milk. Soy milk. Cheese that is naturally low in sodium, such as ricotta cheese, fresh mozzarella, or Swiss cheese Low-sodium or reduced-sodium cheese. Cream cheese. Yogurt. Fats and oils Unsalted butter. Unsalted margarine with no trans fat. Vegetable oils such as canola or olive oils. Seasonings and other foods Fresh and dried herbs and spices. Salt-free seasonings. Low-sodium mustard and ketchup. Sodium-free salad dressing. Sodium-free light mayonnaise. Fresh or refrigerated horseradish. Lemon juice. Vinegar. Homemade, reduced-sodium, or low-sodium soups. Unsalted popcorn and pretzels. Low-salt or salt-free chips. What foods are not recommended? The items listed may not be a complete list. Talk with your dietitian about what dietary choices are best for you. Grains Instant hot cereals. Bread stuffing, pancake, and biscuit mixes. Croutons. Seasoned rice or pasta mixes. Noodle soup cups. Boxed or frozen macaroni and cheese. Regular salted crackers. Self-rising flour. Vegetables Sauerkraut, pickled vegetables, and relishes. Olives. [place] fries. Onion rings. Regular canned vegetables (not low-sodium or reduced-sodium). Regular canned tomato sauce and paste (not low-sodium or reduced-sodium). Regular tomato and vegetable juice (not low-sodium or reduced-sodium). Frozen vegetables in sauces. Meats and other protein foods Meat or fish that is salted, canned, smoked, spiced, or pickled. Bacon, ham, sausage, hotdogs, corned beef, chipped beef, packaged lunch meats, salt pork, jerky, pickled herring, anchovies, regular canned tuna, sardines, salted nuts. Dairy Processed cheese and cheese spreads. Cheese curds. Blue cheese. Feta cheese. String cheese. Regular cottage cheese. Buttermilk. Canned milk. Fats and oils Salted butter. Regular margarine. Ghee. Bacon fat. Seasonings and other foods Onion salt, garlic salt, seasoned salt, table salt, and sea salt. Canned and  packaged gravies. Worcestershire sauce. Tartar sauce. Barbecue sauce. Teriyaki sauce. Soy sauce, including reduced-sodium. Steak sauce. Fish sauce. Oyster sauce. Cocktail sauce. Horseradish that you find on the shelf. Regular ketchup and mustard. Meat flavorings and tenderizers. Bouillon cubes. Hot sauce and Tabasco sauce. Premade or packaged marinades. Premade or packaged taco seasonings. Relishes. Regular salad dressings. Salsa. Potato and tortilla chips. Corn chips and puffs. Salted popcorn and pretzels. Canned or dried soups. Pizza. Frozen entrees and pot pies. Summary  Eating less sodium can help lower your blood pressure, reduce swelling, and protect your heart, liver, and kidneys.  Most people on this plan should limit their sodium intake to 1,500-2,000 mg (milligrams) of sodium each day.  Canned, boxed, and frozen foods are high in sodium. Restaurant foods, fast foods, and pizza are also very high in sodium. You also get sodium by adding salt to food.  Try to cook at home, eat more fresh fruits and vegetables, and eat less fast food, canned, processed, or prepared foods. This information is not intended to replace advice given to you by your health care provider. Make sure you discuss any questions you have with your health care provider. Document Released: 02/12/2002 Document Revised: 08/16/2016 Document Reviewed: 08/16/2016 Elsevier Interactive Patient Education  2018 [place].  CPAP and BiPAP Information CPAP and BiPAP are methods of helping a person breathe with the use of air pressure. CPAP stands for "continuous positive airway pressure." BiPAP stands for "bi-level positive airway pressure." In both methods, air is blown through your nose or mouth and into your air passages to help you breathe well. CPAP and BiPAP use different  amounts of pressure to blow air. With CPAP, the amount of pressure stays the same while you breathe in and out. With BiPAP, the amount of pressure is  increased when you breathe in (inhale) so that you can take larger breaths. Your health care provider will recommend whether CPAP or BiPAP would be more helpful for you. Why are CPAP and BiPAP treatments used? CPAP or BiPAP can be helpful if you have:  Sleep apnea.  Chronic obstructive pulmonary disease (COPD).  Heart failure.  Medical conditions that weaken the muscles of the chest including muscular dystrophy, or neurological diseases such as amyotrophic lateral sclerosis (ALS).  Other problems that cause breathing to be weak, abnormal, or difficult.  CPAP is most commonly used for obstructive sleep apnea (OSA) to keep the airways from collapsing when the muscles relax during sleep. How is CPAP or BiPAP administered? Both CPAP and BiPAP are provided by a small machine with a flexible plastic tube that attaches to a plastic mask. You wear the mask. Air is blown through the mask into your nose or mouth. The amount of pressure that is used to blow the air can be adjusted on the machine. Your health care provider will determine the pressure setting that should be used based on your individual needs. When should CPAP or BiPAP be used? In most cases, the mask only needs to be worn during sleep. Generally, the mask needs to be worn throughout the night and during any daytime naps. People with certain medical conditions may also need to wear the mask at other times when they are awake. Follow instructions from your health care provider about when to use the machine. What are some tips for using the mask?  Because the mask needs to be snug, some people feel trapped or closed-in (claustrophobic) when first using the mask. If you feel this way, you may need to get used to the mask. One way to do this is by holding the mask loosely over your nose or mouth and then gradually applying the mask more snugly. You can also gradually increase the amount of time that you use the mask.  Masks are available in  various types and sizes. Some fit over your mouth and nose while others fit over just your nose. If your mask does not fit well, talk with your health care provider about getting a different one.  If you are using a mask that fits over your nose and you tend to breathe through your mouth, a chin strap may be applied to help keep your mouth closed.  The CPAP and BiPAP machines have alarms that may sound if the mask comes off or develops a leak.  If you have trouble with the mask, it is very important that you talk with your health care provider about finding a way to make the mask easier to tolerate. Do not stop using the mask. Stopping the use of the mask could have a negative impact on your health. What are some tips for using the machine?  Place your CPAP or BiPAP machine on a secure table or stand near an electrical outlet.  Know where the on/off switch is located on the machine.  Follow instructions from your health care provider about how to set the pressure on your machine and when you should use it.  Do not eat or drink while the CPAP or BiPAP machine is on. Food or fluids could get pushed into your lungs by the pressure of the CPAP or  BiPAP.  Do not smoke. Tobacco smoke residue can damage the machine.  For home use, CPAP and BiPAP machines can be rented or purchased through home health care companies. Many different brands of machines are available. Renting a machine before purchasing may help you find out which particular machine works well for you.  Keep the CPAP or BiPAP machine and attachments clean. Ask your health care provider for specific instructions. Get help right away if:  You have redness or open areas around your nose or mouth where the mask fits.  You have trouble using the CPAP or BiPAP machine.  You cannot tolerate wearing the CPAP or BiPAP mask.  You have pain, discomfort, and bloating in your abdomen. Summary  CPAP and BiPAP are methods of helping a person  breathe with the use of air pressure.  Both CPAP and BiPAP are provided by a small machine with a flexible plastic tube that attaches to a plastic mask.  If you have trouble with the mask, it is very important that you talk with your health care provider about finding a way to make the mask easier to tolerate. This information is not intended to replace advice given to you by your health care provider. Make sure you discuss any questions you have with your health care provider. Document Released: 05/21/2004 Document Revised: 07/12/2016 Document Reviewed: 07/12/2016 Elsevier Interactive Patient Education  2017 [employer].

## 2018-04-28 NOTE — Assessment & Plan Note (Signed)
Continue to work towards healthy weight 

## 2018-04-28 NOTE — Assessment & Plan Note (Signed)
Follow-up with Dr. Edwena Bunde office Continue follow-up with our office   We recommend for you to continue taking your Adcirca We recommend for you continue taking your OPSUMIT >>> Patient declined both of these today in office visit

## 2018-04-28 NOTE — Assessment & Plan Note (Signed)
Continue Breo Ellipta Follow-up with our office with Dr. Elsworth Soho first available

## 2018-05-03 ENCOUNTER — Telehealth (HOSPITAL_COMMUNITY): Payer: Self-pay | Admitting: *Deleted

## 2018-05-03 NOTE — Telephone Encounter (Signed)
Received note from [place]. Pt needs clearance to have cataract extraction under IV sedation w/Dr Tama High on 05/05/18.    Per Dr Haroldine Laws: "Patient is high risk w/severe pulmonary hypertension and copd.  Would minimize sedation."  Note faxed back to them at [phone]

## 2018-06-06 ENCOUNTER — Ambulatory Visit: Payer: Medicare Other | Admitting: Pulmonary Disease

## 2018-06-07 ENCOUNTER — Ambulatory Visit: Payer: Medicare Other | Admitting: Pulmonary Disease

## 2018-06-15 DIAGNOSIS — R000 Tachycardia, unspecified: Secondary | ICD-10-CM | POA: Diagnosis not present

## 2018-06-15 DIAGNOSIS — R0689 Other abnormalities of breathing: Secondary | ICD-10-CM | POA: Diagnosis not present

## 2018-06-15 DIAGNOSIS — Z7982 Long term (current) use of aspirin: Secondary | ICD-10-CM | POA: Diagnosis not present

## 2018-06-15 DIAGNOSIS — J9620 Acute and chronic respiratory failure, unspecified whether with hypoxia or hypercapnia: Secondary | ICD-10-CM | POA: Diagnosis not present

## 2018-06-15 DIAGNOSIS — Z885 Allergy status to narcotic agent status: Secondary | ICD-10-CM | POA: Diagnosis not present

## 2018-06-15 DIAGNOSIS — J9622 Acute and chronic respiratory failure with hypercapnia: Secondary | ICD-10-CM | POA: Diagnosis not present

## 2018-06-15 DIAGNOSIS — J441 Chronic obstructive pulmonary disease with (acute) exacerbation: Secondary | ICD-10-CM | POA: Diagnosis not present

## 2018-06-15 DIAGNOSIS — I5033 Acute on chronic diastolic (congestive) heart failure: Secondary | ICD-10-CM | POA: Diagnosis present

## 2018-06-15 DIAGNOSIS — Z9981 Dependence on supplemental oxygen: Secondary | ICD-10-CM | POA: Diagnosis not present

## 2018-06-15 DIAGNOSIS — R918 Other nonspecific abnormal finding of lung field: Secondary | ICD-10-CM | POA: Diagnosis not present

## 2018-06-15 DIAGNOSIS — I509 Heart failure, unspecified: Secondary | ICD-10-CM | POA: Diagnosis not present

## 2018-06-15 DIAGNOSIS — J189 Pneumonia, unspecified organism: Secondary | ICD-10-CM | POA: Diagnosis not present

## 2018-06-15 DIAGNOSIS — G4733 Obstructive sleep apnea (adult) (pediatric): Secondary | ICD-10-CM | POA: Diagnosis not present

## 2018-06-15 DIAGNOSIS — Z880 Allergy status to penicillin: Secondary | ICD-10-CM | POA: Diagnosis not present

## 2018-06-15 DIAGNOSIS — Z7401 Bed confinement status: Secondary | ICD-10-CM | POA: Diagnosis not present

## 2018-06-15 DIAGNOSIS — R0602 Shortness of breath: Secondary | ICD-10-CM | POA: Diagnosis not present

## 2018-06-15 DIAGNOSIS — Z87891 Personal history of nicotine dependence: Secondary | ICD-10-CM | POA: Diagnosis not present

## 2018-06-15 DIAGNOSIS — Z23 Encounter for immunization: Secondary | ICD-10-CM | POA: Diagnosis not present

## 2018-06-15 DIAGNOSIS — E876 Hypokalemia: Secondary | ICD-10-CM | POA: Diagnosis not present

## 2018-06-15 DIAGNOSIS — I491 Atrial premature depolarization: Secondary | ICD-10-CM | POA: Diagnosis not present

## 2018-06-15 DIAGNOSIS — I272 Pulmonary hypertension, unspecified: Secondary | ICD-10-CM | POA: Diagnosis not present

## 2018-06-15 DIAGNOSIS — I4891 Unspecified atrial fibrillation: Secondary | ICD-10-CM | POA: Diagnosis not present

## 2018-06-15 DIAGNOSIS — Z882 Allergy status to sulfonamides status: Secondary | ICD-10-CM | POA: Diagnosis not present

## 2018-06-15 DIAGNOSIS — R0902 Hypoxemia: Secondary | ICD-10-CM | POA: Diagnosis not present

## 2018-06-15 DIAGNOSIS — I4892 Unspecified atrial flutter: Secondary | ICD-10-CM | POA: Diagnosis not present

## 2018-06-15 DIAGNOSIS — Z881 Allergy status to other antibiotic agents status: Secondary | ICD-10-CM | POA: Diagnosis not present

## 2018-06-15 DIAGNOSIS — J9621 Acute and chronic respiratory failure with hypoxia: Secondary | ICD-10-CM | POA: Diagnosis not present

## 2018-06-15 DIAGNOSIS — Z6841 Body Mass Index (BMI) 40.0 and over, adult: Secondary | ICD-10-CM | POA: Diagnosis not present

## 2018-06-15 DIAGNOSIS — J181 Lobar pneumonia, unspecified organism: Secondary | ICD-10-CM | POA: Diagnosis not present

## 2018-06-26 ENCOUNTER — Telehealth (HOSPITAL_COMMUNITY): Payer: Self-pay | Admitting: Surgery

## 2018-06-26 NOTE — Telephone Encounter (Signed)
I called patient to confirm her hospital follow-up appt.  I have scheduled an appt in the [facility] for October 30th at 0930 AM.

## 2018-06-26 NOTE — Telephone Encounter (Signed)
Patient was not home.  I will attempt to call patient again to confirm scheduled appt.

## 2018-06-27 DIAGNOSIS — Z66 Do not resuscitate: Secondary | ICD-10-CM | POA: Diagnosis not present

## 2018-06-27 DIAGNOSIS — R0602 Shortness of breath: Secondary | ICD-10-CM | POA: Diagnosis not present

## 2018-06-27 DIAGNOSIS — R0600 Dyspnea, unspecified: Secondary | ICD-10-CM | POA: Diagnosis not present

## 2018-06-27 DIAGNOSIS — Z79899 Other long term (current) drug therapy: Secondary | ICD-10-CM | POA: Diagnosis not present

## 2018-06-27 DIAGNOSIS — E872 Acidosis: Secondary | ICD-10-CM | POA: Diagnosis present

## 2018-06-27 DIAGNOSIS — J9690 Respiratory failure, unspecified, unspecified whether with hypoxia or hypercapnia: Secondary | ICD-10-CM | POA: Diagnosis not present

## 2018-06-27 DIAGNOSIS — R509 Fever, unspecified: Secondary | ICD-10-CM | POA: Diagnosis not present

## 2018-06-27 DIAGNOSIS — I499 Cardiac arrhythmia, unspecified: Secondary | ICD-10-CM | POA: Diagnosis not present

## 2018-06-27 DIAGNOSIS — I4819 Other persistent atrial fibrillation: Secondary | ICD-10-CM | POA: Diagnosis present

## 2018-06-27 DIAGNOSIS — R109 Unspecified abdominal pain: Secondary | ICD-10-CM | POA: Diagnosis not present

## 2018-06-27 DIAGNOSIS — N179 Acute kidney failure, unspecified: Secondary | ICD-10-CM | POA: Diagnosis present

## 2018-06-27 DIAGNOSIS — J189 Pneumonia, unspecified organism: Secondary | ICD-10-CM | POA: Diagnosis present

## 2018-06-27 DIAGNOSIS — I5021 Acute systolic (congestive) heart failure: Secondary | ICD-10-CM | POA: Diagnosis not present

## 2018-06-27 DIAGNOSIS — E662 Morbid (severe) obesity with alveolar hypoventilation: Secondary | ICD-10-CM | POA: Diagnosis present

## 2018-06-27 DIAGNOSIS — J45909 Unspecified asthma, uncomplicated: Secondary | ICD-10-CM | POA: Diagnosis not present

## 2018-06-27 DIAGNOSIS — R918 Other nonspecific abnormal finding of lung field: Secondary | ICD-10-CM | POA: Diagnosis not present

## 2018-06-27 DIAGNOSIS — J9601 Acute respiratory failure with hypoxia: Secondary | ICD-10-CM | POA: Diagnosis not present

## 2018-06-27 DIAGNOSIS — J918 Pleural effusion in other conditions classified elsewhere: Secondary | ICD-10-CM | POA: Diagnosis present

## 2018-06-27 DIAGNOSIS — R6521 Severe sepsis with septic shock: Secondary | ICD-10-CM | POA: Diagnosis not present

## 2018-06-27 DIAGNOSIS — J9622 Acute and chronic respiratory failure with hypercapnia: Secondary | ICD-10-CM | POA: Diagnosis present

## 2018-06-27 DIAGNOSIS — R0603 Acute respiratory distress: Secondary | ICD-10-CM | POA: Diagnosis not present

## 2018-06-27 DIAGNOSIS — R600 Localized edema: Secondary | ICD-10-CM | POA: Diagnosis not present

## 2018-06-27 DIAGNOSIS — I959 Hypotension, unspecified: Secondary | ICD-10-CM | POA: Diagnosis not present

## 2018-06-27 DIAGNOSIS — J811 Chronic pulmonary edema: Secondary | ICD-10-CM | POA: Diagnosis not present

## 2018-06-27 DIAGNOSIS — N390 Urinary tract infection, site not specified: Secondary | ICD-10-CM | POA: Diagnosis not present

## 2018-06-27 DIAGNOSIS — K8020 Calculus of gallbladder without cholecystitis without obstruction: Secondary | ICD-10-CM | POA: Diagnosis not present

## 2018-06-27 DIAGNOSIS — Z6841 Body Mass Index (BMI) 40.0 and over, adult: Secondary | ICD-10-CM | POA: Diagnosis not present

## 2018-06-27 DIAGNOSIS — I361 Nonrheumatic tricuspid (valve) insufficiency: Secondary | ICD-10-CM | POA: Diagnosis not present

## 2018-06-27 DIAGNOSIS — A419 Sepsis, unspecified organism: Secondary | ICD-10-CM | POA: Diagnosis not present

## 2018-06-27 DIAGNOSIS — K828 Other specified diseases of gallbladder: Secondary | ICD-10-CM | POA: Diagnosis not present

## 2018-06-27 DIAGNOSIS — Z7982 Long term (current) use of aspirin: Secondary | ICD-10-CM | POA: Diagnosis not present

## 2018-06-27 DIAGNOSIS — J9621 Acute and chronic respiratory failure with hypoxia: Secondary | ICD-10-CM | POA: Diagnosis present

## 2018-06-27 DIAGNOSIS — D751 Secondary polycythemia: Secondary | ICD-10-CM | POA: Diagnosis present

## 2018-06-27 DIAGNOSIS — R0989 Other specified symptoms and signs involving the circulatory and respiratory systems: Secondary | ICD-10-CM | POA: Diagnosis not present

## 2018-06-27 DIAGNOSIS — Z882 Allergy status to sulfonamides status: Secondary | ICD-10-CM | POA: Diagnosis not present

## 2018-06-27 DIAGNOSIS — Z87891 Personal history of nicotine dependence: Secondary | ICD-10-CM | POA: Diagnosis not present

## 2018-06-27 DIAGNOSIS — Z515 Encounter for palliative care: Secondary | ICD-10-CM | POA: Diagnosis not present

## 2018-06-27 DIAGNOSIS — E669 Obesity, unspecified: Secondary | ICD-10-CM | POA: Diagnosis not present

## 2018-06-27 DIAGNOSIS — I509 Heart failure, unspecified: Secondary | ICD-10-CM | POA: Diagnosis not present

## 2018-06-27 DIAGNOSIS — K760 Fatty (change of) liver, not elsewhere classified: Secondary | ICD-10-CM | POA: Diagnosis not present

## 2018-06-27 DIAGNOSIS — I110 Hypertensive heart disease with heart failure: Secondary | ICD-10-CM | POA: Diagnosis not present

## 2018-06-27 DIAGNOSIS — I2723 Pulmonary hypertension due to lung diseases and hypoxia: Secondary | ICD-10-CM | POA: Diagnosis not present

## 2018-06-27 DIAGNOSIS — J80 Acute respiratory distress syndrome: Secondary | ICD-10-CM | POA: Diagnosis not present

## 2018-06-27 DIAGNOSIS — I272 Pulmonary hypertension, unspecified: Secondary | ICD-10-CM | POA: Diagnosis not present

## 2018-06-27 DIAGNOSIS — I517 Cardiomegaly: Secondary | ICD-10-CM | POA: Diagnosis not present

## 2018-06-27 DIAGNOSIS — J90 Pleural effusion, not elsewhere classified: Secondary | ICD-10-CM | POA: Diagnosis not present

## 2018-06-27 DIAGNOSIS — I4891 Unspecified atrial fibrillation: Secondary | ICD-10-CM | POA: Diagnosis not present

## 2018-06-27 DIAGNOSIS — I2699 Other pulmonary embolism without acute cor pulmonale: Secondary | ICD-10-CM | POA: Diagnosis present

## 2018-06-27 DIAGNOSIS — I2721 Secondary pulmonary arterial hypertension: Secondary | ICD-10-CM | POA: Diagnosis present

## 2018-06-27 DIAGNOSIS — Z4682 Encounter for fitting and adjustment of non-vascular catheter: Secondary | ICD-10-CM | POA: Diagnosis not present

## 2018-06-27 DIAGNOSIS — Z48813 Encounter for surgical aftercare following surgery on the respiratory system: Secondary | ICD-10-CM | POA: Diagnosis not present

## 2018-06-27 DIAGNOSIS — Z452 Encounter for adjustment and management of vascular access device: Secondary | ICD-10-CM | POA: Diagnosis not present

## 2018-06-27 DIAGNOSIS — E871 Hypo-osmolality and hyponatremia: Secondary | ICD-10-CM | POA: Diagnosis present

## 2018-06-27 DIAGNOSIS — E875 Hyperkalemia: Secondary | ICD-10-CM | POA: Diagnosis not present

## 2018-06-27 DIAGNOSIS — R0902 Hypoxemia: Secondary | ICD-10-CM | POA: Diagnosis not present

## 2018-06-29 ENCOUNTER — Ambulatory Visit: Payer: Medicare Other | Admitting: Pulmonary Disease

## 2018-07-03 ENCOUNTER — Telehealth (HOSPITAL_COMMUNITY): Payer: Self-pay | Admitting: Surgery

## 2018-07-03 NOTE — Telephone Encounter (Signed)
I called and spoke with Alexis [name]'s family member who answered the phone.  She tells me that Alexis [name] unfortunately passed away at [facility] on 07/02/2018.  I will inform clinic and remove her from our schedule for upcoming appts.

## 2018-07-05 ENCOUNTER — Inpatient Hospital Stay (HOSPITAL_COMMUNITY): Payer: Medicare Other

## 2018-07-07 DEATH — deceased

## 2018-07-18 ENCOUNTER — Ambulatory Visit: Payer: Medicare Other | Admitting: Pulmonary Disease

## 2018-07-19 ENCOUNTER — Encounter (HOSPITAL_COMMUNITY): Payer: Medicare Other | Admitting: Internal Medicine

## 2018-10-03 ENCOUNTER — Other Ambulatory Visit (HOSPITAL_COMMUNITY): Payer: Self-pay
